# Patient Record
Sex: Female | Born: 1965 | Race: White | Hispanic: No | Marital: Single | Smoking: Never smoker
Health system: Southern US, Community
[De-identification: ages and names within clinical notes are randomized; demographics above are authoritative.]

## PROBLEM LIST (undated history)

## (undated) DIAGNOSIS — N750 Cyst of Bartholin's gland: Secondary | ICD-10-CM

## (undated) DIAGNOSIS — K219 Gastro-esophageal reflux disease without esophagitis: Secondary | ICD-10-CM

## (undated) DIAGNOSIS — E039 Hypothyroidism, unspecified: Secondary | ICD-10-CM

## (undated) DIAGNOSIS — M25569 Pain in unspecified knee: Secondary | ICD-10-CM

## (undated) DIAGNOSIS — F419 Anxiety disorder, unspecified: Secondary | ICD-10-CM

## (undated) DIAGNOSIS — M25559 Pain in unspecified hip: Secondary | ICD-10-CM

## (undated) DIAGNOSIS — R569 Unspecified convulsions: Secondary | ICD-10-CM

## (undated) HISTORY — DX: Cyst of Bartholin's gland: N75.0

## (undated) HISTORY — DX: Gastro-esophageal reflux disease without esophagitis: K21.9

## (undated) HISTORY — DX: Pain in unspecified hip: M25.559

## (undated) HISTORY — DX: Pain in unspecified knee: M25.569

## (undated) HISTORY — DX: Hypothyroidism, unspecified: E03.9

## (undated) HISTORY — PX: TOTAL ABDOMINAL HYSTERECTOMY: SHX209

## (undated) HISTORY — PX: ABDOMINAL HYSTERECTOMY: SHX81

## (undated) HISTORY — PX: KNEE ARTHROSCOPY: SUR90

---

## 1998-11-18 ENCOUNTER — Other Ambulatory Visit: Admission: RE | Admit: 1998-11-18 | Discharge: 1998-11-18 | Payer: Self-pay | Admitting: Obstetrics & Gynecology

## 2003-02-18 ENCOUNTER — Emergency Department (HOSPITAL_COMMUNITY): Admission: EM | Admit: 2003-02-18 | Discharge: 2003-02-18 | Payer: Self-pay | Admitting: Emergency Medicine

## 2003-02-18 ENCOUNTER — Encounter: Payer: Self-pay | Admitting: Emergency Medicine

## 2003-08-24 ENCOUNTER — Emergency Department (HOSPITAL_COMMUNITY): Admission: EM | Admit: 2003-08-24 | Discharge: 2003-08-24 | Payer: Self-pay | Admitting: Emergency Medicine

## 2004-08-10 ENCOUNTER — Encounter: Admission: RE | Admit: 2004-08-10 | Discharge: 2004-08-10 | Payer: Self-pay | Admitting: Sports Medicine

## 2004-09-02 ENCOUNTER — Encounter: Admission: RE | Admit: 2004-09-02 | Discharge: 2004-09-02 | Payer: Self-pay | Admitting: Sports Medicine

## 2004-10-28 ENCOUNTER — Encounter: Admission: RE | Admit: 2004-10-28 | Discharge: 2004-10-28 | Payer: Self-pay | Admitting: Sports Medicine

## 2005-04-06 ENCOUNTER — Ambulatory Visit (HOSPITAL_BASED_OUTPATIENT_CLINIC_OR_DEPARTMENT_OTHER): Admission: RE | Admit: 2005-04-06 | Discharge: 2005-04-06 | Payer: Self-pay | Admitting: Surgery

## 2005-04-06 ENCOUNTER — Encounter (INDEPENDENT_AMBULATORY_CARE_PROVIDER_SITE_OTHER): Payer: Self-pay | Admitting: Specialist

## 2005-04-06 ENCOUNTER — Ambulatory Visit (HOSPITAL_COMMUNITY): Admission: RE | Admit: 2005-04-06 | Discharge: 2005-04-06 | Payer: Self-pay | Admitting: Surgery

## 2005-04-10 ENCOUNTER — Emergency Department (HOSPITAL_COMMUNITY): Admission: EM | Admit: 2005-04-10 | Discharge: 2005-04-10 | Payer: Self-pay | Admitting: Emergency Medicine

## 2007-04-26 ENCOUNTER — Encounter: Admission: RE | Admit: 2007-04-26 | Discharge: 2007-04-26 | Payer: Self-pay | Admitting: *Deleted

## 2007-05-24 ENCOUNTER — Encounter: Admission: RE | Admit: 2007-05-24 | Discharge: 2007-05-24 | Payer: Self-pay | Admitting: *Deleted

## 2007-11-04 ENCOUNTER — Encounter: Admission: RE | Admit: 2007-11-04 | Discharge: 2007-11-04 | Payer: Self-pay | Admitting: Family Medicine

## 2008-01-01 ENCOUNTER — Inpatient Hospital Stay (HOSPITAL_COMMUNITY): Admission: EM | Admit: 2008-01-01 | Discharge: 2008-01-03 | Payer: Self-pay | Admitting: Emergency Medicine

## 2008-06-18 ENCOUNTER — Ambulatory Visit (HOSPITAL_COMMUNITY): Admission: RE | Admit: 2008-06-18 | Discharge: 2008-06-18 | Payer: Self-pay | Admitting: Obstetrics and Gynecology

## 2008-11-25 ENCOUNTER — Encounter: Admission: RE | Admit: 2008-11-25 | Discharge: 2008-11-25 | Payer: Self-pay | Admitting: Gastroenterology

## 2009-07-21 ENCOUNTER — Ambulatory Visit (HOSPITAL_COMMUNITY): Admission: RE | Admit: 2009-07-21 | Discharge: 2009-07-21 | Payer: Self-pay | Admitting: Obstetrics and Gynecology

## 2010-08-21 ENCOUNTER — Encounter: Payer: Self-pay | Admitting: Neurosurgery
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
## 2010-12-13 NOTE — Discharge Summary (Signed)
NAMEMarland Kitchen  Karen Gross, Karen Gross NO.:  [id number]   MEDICAL RECORD NO.:  [id number]          PATIENT TYPE:  INP   LOCATION:  3308                         FACILITY:  [facility]   PHYSICIAN:  Ramiro Harvest, MD    DATE OF BIRTH:  12-Jan-1966   DATE OF ADMISSION:  01/01/2008  DATE OF DISCHARGE:                               DISCHARGE SUMMARY   The patient's primary care physician is Claude Manges, nurse  practitioner, at [facility] at [facility].   DISCHARGE DIAGNOSES:  1. Seizures versus pseudoseizures.  2. Headaches.  3. Hypothyroidism.  4. Hypokalemia.  5. Gastroesophageal reflux disease.  6. History of constipation.  7. Status post hysterectomy.  8. History of Mohs.   DISPOSITION AND FOLLOW-UP:  The patient will be discharged home to  follow up with PCP in 1 week.  On follow-up, a basic metabolic profile  needs to be checked.  A Dilantin level will also need to be checked as  well and the patient is also to call to schedule a followup appointment  with Dr. Threasa Beards of [facility] in the next 3-4 weeks and the  patient's PCP and Dr. Threasa Beards.  The patient is to follow up with them on  EEG results.   DISCHARGE MEDICATIONS:  1. Dilantin 300 mg p.o. nightly.  2. Effexor 150 mg p.o. daily.  3. Synthroid 88 mcg Monday through Thursday and 100 mcg Friday through      Sunday.  4. Fiber pills as previously taken.  5. Lysine as previously taken.  6. Prilosec 20 mg p.o. daily.  7. Mucinex as previously taken.   CONSULTANTS:  Consultations done.  1. A Neurology consult was done.  The patient was seen in consultation      by Dr. Threasa Beards on January 01, 2008.   PROCEDURES PERFORMED:  1. A chest x-ray was performed on January 01, 2008 that showed no active      disease.  2. A head CT without contrast was performed on January 01, 2008 that      showed a 6 x 9 mm hypodensity in right deep white matter, unclear      if this is an acute finding.  MR may be helpful in further  evaluation.  3. An MRI was obtained on January 01, 2008, which showed no abnormality to      correlate to the hypodense focus identified on the earlier CT.      Mild left hippocampal volume loss suspected with increased T2      signal which may reflect acute postictal changes, otherwise      unremarkable MRI appearance of the brain for age.  4. An EEG was performed on January 02, 2008.   BRIEF ADMISSION HISTORY AND PHYSICAL:  Karen Gross is a 45 year old  right-handed white female with family history of seizures,  hypothyroidism, headache, who presented to the ED with recurrent  seizures.  The patient was postictal and drowsy secondary to Ativan and  as such most of the history was obtained from the patient's partner.  Per the patient's partner, the patient had been having episodes of  cough  and was seen by the PCP 2 days prior to admission, was placed on  cephalexin as well as Tessalon Perles and Mucinex for possible sinus  infection.  On the morning of admission at approximately 4 a.m., the  patient awoke with complaints of frontal headache and then fell backward  on the bed with jerking movements in all limbs.  Per the patient's  partner, the patient had some grand mal seizures lasting approximately a  minute with no tongue biting.  The patient had about 2-3 minutes of  unresponsiveness in between the episodes and had further episodes of  witnessed seizures.  The patient had a total of 6 episodes of witnessed  seizures per partner.  The patient with no recollection of events  afterwards.  EMS was called.  The patient was brought to the ED.  In the  ED, no further episodes of seizures.  UDS was obtained which was  negative.  Alcohol level was negative.  Head CT with a 6 x 9 mm  hypodensity as stated above.  MRI was also obtained with results as  stated above.  BMET was unremarkable.  Alcohol level was less than 5.  We were called to admit the patient for further evaluation and   recommendation.  The patient denied any fevers, no chills, no nausea, no  vomiting, no chest pain, no shortness of breath, no palpitations, no  melena, no hematochezia, no abdominal pain, no other focal neurological  symptoms.   PHYSICAL EXAMINATION:  On admission, temperature 97.6, blood pressure  133/63, pulse of 79, respiratory rate 18, and sating 96% on room air.  GENERAL:  The patient was sleeping but arousable to verbal and noxious  stimuli and following commands.  HEENT:  Normocephalic, atraumatic.  Pupils equal, round, and reactive to  light.  Extraocular movements intact.  Oropharynx was clear and moist.  No lesions.  No exudate.  NECK:  Supple.  No lymphadenopathy.  RESPIRATORY:  Lungs were clear to auscultation bilaterally.  No wheezes,  no rhonchi, and no crackles.  CARDIOVASCULAR:  Regular rate and rhythm.  No murmurs, rubs, or gallops.  ABDOMEN:  Soft, nontender, nondistended, positive bowel sounds.  EXTREMITIES:  No clubbing, cyanosis, or edema.  NEUROLOGICAL:  The patient was alert and oriented x3.  Cranial nerves II-  XII were grossly intact.  Sensation was intact.  Unable to elicit  reflexes diffusely.  Negative Babinski.  Visual fields were intact.  Slight dysmetria on the left.  Gait was not tested secondary to safety.   ADMISSION LABS:  Sodium 142, potassium 3.4, chloride 109, bicarb 24, BUN  8, creatinine 0.81, glucose of 88, calcium of 8.8.  Alcohol level less  than 5.  UDS negative.  CBC, white count 6.8, hemoglobin 13.5,  hematocrit 38.8, platelets 250, ANC 4.4.  A urine HCG was negative.  CT  of the head as stated above.  MRI as stated above.   HOSPITAL COURSE:  1. Seizures versus pseudoseizures.  The patient was admitted initially      to the step-down unit.  Workup on admission had so far been      negative.  Magnesium level was checked, electrolytes were checked,      hepatic panel was checked, chest x-ray was also obtained, a      urinalysis was  obtained, RPR was also obtained, total CK was      obtained, which were all negative.  The patient was placed on  Ativan as needed, later on that evening on admission the patient      did have 3 more episodes of seizures per nursing report which      within the 30-minute period which resolved rather quickly with very      little postictal state.  Neurology was consulted that came and saw      the patient evaluation.  The patient was loaded with fosphenytoin      and placed on Dilantin 300 mg daily.  An EEG was obtained with      results pending by day of discharge.  The patient did not have any      more episodes of seizures over the next 36 hours and the patient      will be discharged home in stable and improved condition to follow      up with Neurology and follow up with PCP.  The patient will need a      Dilantin level checked in the weeks post discharge.  The patient      will be discharged in stable and improved condition.  2. Hypothyroidism.  A TSH was checked which was within normal limits.      The patient was continued on her home dose of Synthroid.  3. Hypokalemia.  The patient was found to be hypokalemic.  Magnesium      level was checked which was stable and the patient's potassium      levels were repleted.  The rest of the patient's chronic medical      issues were stable throughout the hospitalization and the patient      will be discharged in stable and improved condition.   On the day of discharge, vital signs, temperature 97.8, pulse of 94,  blood pressure 123/63, respiratory rate 18, and sating 97% on room air.   DISCHARGE LABS:  Sodium 140, potassium 3.6, chloride 107, bicarb 28, BUN  4, creatinine 0.82, glucose of 94, and a calcium of 8.7.  EEG was  pending on the day of discharge and the patient will need to follow up  on this.   It was a pleasure taking care of Karen Gross.      Ramiro Harvest, MD  Electronically Signed     DT/MEDQ  D:   01/03/2008  T:  01/03/2008  Job:  [id number]

## 2010-12-13 NOTE — H&P (Signed)
NAMEJONNIE, Gross NO.:  [id number]   MEDICAL RECORD NO.:  [id number]          PATIENT TYPE:  INP   LOCATION:  2913                         FACILITY:  [facility]   PHYSICIAN:  Ramiro Harvest, MD    DATE OF BIRTH:  08/31/1965   DATE OF ADMISSION:  01/01/2008  DATE OF DISCHARGE:                              HISTORY & PHYSICAL   PRIMARY CARE PHYSICIAN:  Dr. Claude Manges   HISTORY OF PRESENT ILLNESS:  Karen Gross is a 45 year old right-  handed white female with history of seizures, family history of  seizures, hypothyroidism, headaches who presents to the ED with  recurrent seizures.  The patient was postictal and drowsy secondary to  Ativan and as such most of the history was obtained from the patient's  partner.  Per the patient's partner, the patient had been having  episodes of cough and was seen by PCP 2 days prior to admission and was  placed on cephalexin as well as Tessalon Perles and Mucinex for possible  sinus infection.  On the morning of admission at approximately 4:00  a.m., the patient woke with complaints of a frontal headache and then  slammed backward on the bed with a jerking movements in all limbs and  per patient had some grand mal seizure lasting approximately a minute,  no tongue biting.  The patient had about 2-3 minutes of unresponsiveness  in between episodes and had further episodes of witnessed seizures.  The  patient had a total of 6 episodes of witnessed seizures per partner.  The patient with no recollection of events afterwards.  EMS was called,  the patient was brought to the ED, in the ED no further episodes of  seizures.  UDS obtained was negative, alcohol level negative, head CT  with a 6 x 9 mm hypodensity.  MRI was obtained that showed a mild left  hippocampal volume loss suspected with an increased T2 signal.  BMET was  unremarkable, alcohol level was less than 5.  We were called to admit  the patient for further evaluation  and recommendations.  The patient  denied any fevers, no chills, no nausea no vomiting, no chest pain, no  shortness of breath, no palpitations, no melena or hematochezia, no  abdominal pain, no other focal neurological deficits.   ALLERGIES:  No known drug allergies.   PAST MEDICAL HISTORY:  1. History of seizures, last episode 11 years ago approximately  2. Hypothyroidism.  3. History of headaches.  4. Status post hysterectomy secondary to fibroids.  5. Fibroids.  6. Moles.  7. Constipation.  8. Gastroesophageal reflux disease.   MEDICATIONS:  Effexor XR 150 mg p.o. daily, Synthroid 88 mcg Mondays  through Thursdays and 100 mcg Fridays through Sunday; fiber pills 2  tablets daily; Lycine daily; cephalexin, dose unknown; Tessalon Perles,  dose unknown; Mucinex, dose unknown; Prilosec 20 mg daily.  Cephalexin,  Tessalon Perles, and Mucinex all started on December 30, 2007.   SOCIAL HISTORY:  The patient lives in [place] with her partner and  their 2 children.  No tobacco use, occasional alcohol use, no IV drug  use.   FAMILY HISTORY:  Mother alive, age 77 with valvular heart disease and  hypertension.  Father alive, age 6 with a history of hypertension and  seizures.  The patient has four sisters with histories of moles,  chemical imbalance and migraine headaches.  The patient with a great  nephew with a history of seizures.   REVIEW OF SYSTEMS:  Positive for cough and constipation, otherwise per  HPI.   PHYSICAL EXAMINATION:  VITAL SIGNS:  Temperature 97.6, blood pressure  133/63, pulse of 79, respiratory rate 18, sating 96% on room air.  GENERAL: The patient sleeping but arousable to verbal and noxious  stimuli and following commands.  HEENT:  Normocephalic, atraumatic.  Pupils equal, round and reactive to  light.  Extraocular movements intact.  Oropharynx is clear, moist, no  lesions, no exudates.  Neck is supple.  No lymphadenopathy.  RESPIRATORY:  Lungs are clear to  auscultation bilaterally.  No wheezes,  no rhonchi.  No crackles.  CARDIOVASCULAR: Regular rate and rhythm.  No murmurs, rubs or gallops.  ABDOMEN:  Soft, nontender, nondistended.  Positive bowel sounds.  EXTREMITIES:  No clubbing, cyanosis or edema.  NEUROLOGICAL:  The patient is alert and oriented x3.  Cranial nerves II-  XII are grossly intact.  Sensation is intact, unable to elicit reflexes  diffusely, negative Babinski, visual fields intact, some slight  dysmetria on the left, gait not tested secondary to safety.   LABORATORY DATA:  Sodium 142, potassium 3.4, chloride 109, bicarb 24,  BUN 8, creatinine 0.81, glucose of 88, calcium of 8.8, alcohol level  less than 5.  UDS negative.  CBC white count 6.8, hemoglobin 13.5,  hematocrit 38.8, platelets of 250, ANC of 4.4.  A urine HCG was  negative.  CT of the head with a 6 x 9 mm hypodensity in the right deep  white matter.  Unclear of acute findings, MRI for further evaluation.  MRI no abnormality to correlate with hypodense focus identified on CT,  mild left hippocampal volume loss suspected with increased T2 signal  which may reflect acute postictal changes, otherwise unremarkable.  MRI  for brain age.   ASSESSMENT AND PLAN:  Karen Gross is a 45 year old female with  history of seizures, last 11 years ago, hypothyroidism, headaches who  presents to the ED with recurrent episodes of seizures:  1. Recurrent seizures.  Admit the patient to the stepdown unit.      Differential includes cerebrovascular disease versus brain tumor.      It is unlikely whether negative MI versus alcohol withdrawal,      unlikely with no history of alcohol abuse on alcohol level less      than 5 versus degenerative disorder versus electrolyte metabolic      abnormality.  UDS was negative as well.  We checked her hepatic      panel, checked her mag level, checked chest x-ray, checked UA      cultures and sensitivities, checked RPR, checked a total CK,  check      a EEG, Ativan as needed.  We will consult with Neurology for      further evaluation and recommendations.  Seizure precautions      monitor.  2. Hypothyroidism.  Check a TSH, continue home dose Synthroid.  3. Headache effects at home dose.  4. Protonix 40 mg daily.  5. Hypokalemia.  Replete  6. Prophylaxis.  Protonix for gastrointestinal prophylaxis, sequential      compression devices for deep venous  thrombosis prophylaxis.   It was a pleasure taking care of Ms. Karen Gross.      Ramiro Harvest, MD  Electronically Signed     DT/MEDQ  D:  01/01/2008  T:  01/02/2008  Job:  [id number]

## 2010-12-13 NOTE — Procedures (Signed)
EEG NUMBER:   REFERRING PHYSICIAN:  Ramiro Harvest, M.D.   This is a portable study on a lethargic patient currently in the  coronary care unit at [facility]  The patient was exposed to  photic stimulation.  According to the technician note, hyperventilation  could not be performed in a portable study.   CURRENT MEDICATIONS:  Ativan, potassium, Protonix, Effexor, Synthroid,  Lovenox, Dilantin, phenytoin, fosphenytoin are both listed, Zofran, and  Tylenol.   This patient has a history of seizures that ceased about 12 years ago  and was now admitted due to resurgence of seizure activity.  Clinical  description of the seizures was not available.   The study has excessive motion and muscle tone artifact and is therefore  very hard to interpret.  The patient's status is somewhat awake.  Also,  clinically not described as alert with a posterior dominant background  rhythm of 8 Hz and then became progressively drowsy.  Theta range  frequencies are then seen indicating drowsiness and finally the patient  is asleep seen as frontal and central sleep spindle activity, which is  not symmetric.  She started snoring, which caused additional artifact  towards the last third of the recording and photic stimulation was  performed at the very end of this recording showing no epileptiform  discharges in response to this maneuver.  The EKG shows a sinus tachycardia with occasional R-R interval changes  that may be PVCs or PACs.   CONCLUSION:  This is a technically challenging study.  The patient  showed symmetric sleep architecture and symmetric awake, alert  architecture, however, during drowsiness, there were multiple epochs  during which the right-sided temporal EEG appeared slower than on the  left.  I did not see evidence of epileptiform discharges; however, I cannot  consider this a normal EEG as the patient seems to be unable to comply  with the required maneuvers.  I believe this  patient was unable to relax  and to follow the activation procedure instructions.  Also, no  epileptiform discharge is noted.  The motion artifact makes the  interpretation of the study rather difficult.  There is no obvious  abnormality that would be linked to an epileptiform discharge seen.      Melvyn Novas, M.D.  Electronically Signed     [id number]  D:  01/03/2008 12:58:19  T:  01/04/2008 00:20:15  Job #:  [id number]

## 2010-12-16 NOTE — Op Note (Signed)
Karen Gross, Karen Gross             ACCOUNT NO.:  [id number]   MEDICAL RECORD NO.:  [id number]          PATIENT TYPE:  AMB   LOCATION:  DSC                          FACILITY:  [facility]   PHYSICIAN:  Thomas A. Cornett, M.D.DATE OF BIRTH:  [birth year]   DATE OF PROCEDURE:  04/06/2005  DATE OF DISCHARGE:                                 OPERATIVE REPORT   PREOPERATIVE DIAGNOSIS:  Persistent left groin granulation tissue measuring  2 x 2 cm.   POSTOPERATIVE DIAGNOSIS:  Persistent left groin granulation tissue measuring  2 x 2 cm.   PROCEDURE:  Excision of 2 x 2-cm region of left groin granulation tissue.   SURGEON:  Maisie Fus A. Cornett, M.D.   ANESTHESIA:  LMA with 10 cc of 0.5% Sensorcaine with epinephrine local.   ESTIMATED BLOOD LOSS:  5 cc.   SPECIMENS:  2 x 2-cm skin with subcutaneous tissue, granulation region, sent  to pathology for further evaluation.   INDICATIONS FOR PROCEDURE:  The patient is a 45 year old female who has an  area in her left groin near her mons pubis that has been tender.  She has  been worked up and treated medically, with no resolution of her pain.  The  area measures 2 x 2, is tender to touch, and has the appearance of old  granulation tissue, probably secondary to previous folliculitis.  After  trying antibiotics on her, she did not get better and I recommended excision  of it.  She understood and agreed to proceed.   DESCRIPTION OF PROCEDURE:  The patient was brought to the operating suite  and placed supine.  Anesthesia was initiated initially with IV Versed by  anesthesia, but then LMA was inserted due patient discomfort.  The left  groin was then prepped and draped in sterile fashion.  In the left crease  between the mons pubis and left thigh was an area of skin within the hair  follicles that measured 2 x 2 cm and was slightly erythematous.  This was  prepped and draped in sterile fashion, and 1% lidocaine was used to  anesthetize the area.   The  area that was excised measured a total area of 2 x 2 cm.  I palpated the  subcutaneous tissues and could feel nothing else at this point in time.  Hemostasis was achieved with cautery, and 4-0 Monocryl was used to close the  skin incisions.  Steri-Strips and dry dressings were applied.  All final  counts were counted and found to be correct at this portion of the case.   The patient was awakened and taken to recovery in satisfactory condition.      Thomas A. Cornett, M.D.  Electronically Signed     TAC/MEDQ  D:  04/06/2005  T:  04/06/2005  Job:  [id number]   cc:   [facility]

## 2011-04-17 ENCOUNTER — Ambulatory Visit (HOSPITAL_COMMUNITY)
Admission: RE | Admit: 2011-04-17 | Discharge: 2011-04-17 | Disposition: A | Discharge status: Home / Self Care | Payer: PRIVATE HEALTH INSURANCE | Attending: Psychiatry | Admitting: Psychiatry

## 2011-04-17 DIAGNOSIS — F331 Major depressive disorder, recurrent, moderate: Secondary | ICD-10-CM | POA: Insufficient documentation

## 2011-04-27 LAB — DIFFERENTIAL
Basophils Absolute: 0
Eosinophils Absolute: 0.1
Eosinophils Relative: 1
Lymphocytes Relative: 30
Lymphs Abs: 1.7
Lymphs Abs: 2
Monocytes Absolute: 0.5
Neutro Abs: 3.9
Neutro Abs: 4.4
Neutrophils Relative %: 65

## 2011-04-27 LAB — BASIC METABOLIC PANEL
BUN: 8
CO2: 28
Calcium: 8.6
Calcium: 8.8
Chloride: 107
Chloride: 109
Creatinine, Ser: 0.81
Creatinine, Ser: 0.82
GFR calc Af Amer: >60
GFR calc Af Amer: >60
GFR calc non Af Amer: >60
GFR calc non Af Amer: >60
Potassium: 3.4 — ABNORMAL LOW
Potassium: 3.6
Sodium: 139

## 2011-04-27 LAB — RAPID URINE DRUG SCREEN, HOSP PERFORMED
Amphetamines: NOT DETECTED
Benzodiazepines: NOT DETECTED
Tetrahydrocannabinol: NOT DETECTED

## 2011-04-27 LAB — CBC
HCT: 40.1
Hemoglobin: 14.3
MCV: 90.2
Platelets: 250
Platelets: 256
WBC: 6.5
WBC: 6.8

## 2011-04-27 LAB — HEPATIC FUNCTION PANEL
ALT: 14
Albumin: 3.8
Indirect Bilirubin: 0.7
Total Protein: 6.2

## 2011-04-27 LAB — TSH: TSH: 5.437

## 2011-04-27 LAB — ETHANOL: Alcohol, Ethyl (B): <5

## 2011-04-27 LAB — PREGNANCY, URINE: Preg Test, Ur: NEGATIVE

## 2011-04-27 LAB — APTT: aPTT: 29

## 2011-04-27 LAB — MAGNESIUM: Magnesium: 2.2

## 2011-04-27 LAB — CK: Total CK: 74

## 2011-04-27 LAB — RPR: RPR Ser Ql: NONREACTIVE

## 2012-02-02 ENCOUNTER — Other Ambulatory Visit (HOSPITAL_COMMUNITY): Payer: Self-pay | Admitting: Obstetrics and Gynecology

## 2012-02-02 DIAGNOSIS — Z1231 Encounter for screening mammogram for malignant neoplasm of breast: Secondary | ICD-10-CM

## 2012-02-23 ENCOUNTER — Ambulatory Visit (HOSPITAL_COMMUNITY)
Admission: RE | Admit: 2012-02-23 | Discharge: 2012-02-23 | Disposition: A | Discharge status: Home / Self Care | Payer: 59 | Source: Ambulatory Visit | Attending: Obstetrics and Gynecology | Admitting: Obstetrics and Gynecology

## 2012-02-23 DIAGNOSIS — Z1231 Encounter for screening mammogram for malignant neoplasm of breast: Secondary | ICD-10-CM | POA: Insufficient documentation

## 2012-08-13 ENCOUNTER — Other Ambulatory Visit: Payer: Self-pay | Admitting: Family Medicine

## 2012-08-13 ENCOUNTER — Ambulatory Visit
Admission: RE | Admit: 2012-08-13 | Discharge: 2012-08-13 | Disposition: A | Discharge status: Home / Self Care | Payer: BC Managed Care – PPO | Source: Ambulatory Visit | Attending: Family Medicine | Admitting: Family Medicine

## 2012-08-13 DIAGNOSIS — R05 Cough: Secondary | ICD-10-CM

## 2013-12-16 ENCOUNTER — Ambulatory Visit (INDEPENDENT_AMBULATORY_CARE_PROVIDER_SITE_OTHER): Payer: BC Managed Care – PPO | Admitting: Family Medicine

## 2013-12-16 VITALS — BP 108/64 | HR 75 | Temp 98.0°F | Resp 16 | Ht 66.0 in | Wt 202.0 lb

## 2013-12-16 DIAGNOSIS — R059 Cough, unspecified: Secondary | ICD-10-CM

## 2013-12-16 DIAGNOSIS — J209 Acute bronchitis, unspecified: Secondary | ICD-10-CM

## 2013-12-16 DIAGNOSIS — J22 Unspecified acute lower respiratory infection: Secondary | ICD-10-CM

## 2013-12-16 DIAGNOSIS — J988 Other specified respiratory disorders: Secondary | ICD-10-CM

## 2013-12-16 DIAGNOSIS — R05 Cough: Secondary | ICD-10-CM

## 2013-12-16 DIAGNOSIS — R0982 Postnasal drip: Secondary | ICD-10-CM

## 2013-12-16 MED ORDER — AZITHROMYCIN 250 MG PO TABS: ORAL_TABLET | ORAL | Status: DC

## 2013-12-16 MED ORDER — BENZONATATE 100 MG PO CAPS: 200.0000 mg | ORAL_CAPSULE | Freq: Two times a day (BID) | ORAL | Status: DC | PRN

## 2013-12-16 MED ORDER — HYDROCOD POLST-CHLORPHEN POLST 10-8 MG/5ML PO LQCR: 5.0000 mL | Freq: Every evening | ORAL | Status: DC | PRN

## 2013-12-16 MED ORDER — IPRATROPIUM BROMIDE 0.03 % NA SOLN: 2.0000 | Freq: Two times a day (BID) | NASAL | Status: DC

## 2013-12-16 NOTE — Progress Notes (Signed)
Chief Complaint:  Chief Complaint  Patient presents with  . Cough    x's 10 days  . Nasal Congestion    HPI: Karen Gross is a 48 y.o. female who is here for  10 day history of cough , green sputum, runny nose. No fevers or chills. She feels overall better but can't get  rid of it. Nyquil and dayquil without releif. And also mucinex DM. She deneis fevers or chills, facial pain or ear pain. She has had z packs in the past and that has helped. No allergies or asthma. Nonsmoker. She works at [employer]. 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   No past medical history on file. No past surgical history on file. History   Social History  . Marital Status: Single    Spouse Name: N/A    Number of Children: N/A  . Years of Education: N/A   Social History Main Topics  . Smoking status: Never Smoker   . Smokeless tobacco: None  . Alcohol Use: None  . Drug Use: None  . Sexual Activity: None   Other Topics Concern  . None   Social History Narrative  . None   No family history on file. No Known Allergies Prior to Admission medications   Medication Sig Start Date End Date Taking? Authorizing Provider  ARIPiprazole (ABILIFY) 10 MG tablet Take 10 mg by mouth daily.   Yes Historical Provider, MD  meloxicam (MOBIC) 15 MG tablet Take 15 mg by mouth daily.   Yes Historical Provider, MD  topiramate (TOPAMAX) 200 MG tablet Take 200 mg by mouth 2 (two) times daily.   Yes Historical Provider, MD     ROS: The patient denies fevers, chills, night sweats, unintentional weight loss, chest pain, palpitations, wheezing, dyspnea on exertion, nausea, vomiting, abdominal pain, dysuria, hematuria, melena, numbness, weakness, or tingling.   All other systems have been reviewed and were otherwise negative with the exception of those mentioned in the HPI and as above.    PHYSICAL EXAM: Filed Vitals:   12/16/13 1742  BP: 108/64  Pulse: 75  Temp: 98 F (36.7  C)  Resp: 16   Filed Vitals:   12/16/13 1742  Height: 5\' 6"  (1.676 m)  Weight: 202 lb (91.627 kg)   Body mass index is 32.62 kg/(m^2).  General: Alert, no acute distress HEENT:  Normocephalic, atraumatic, oropharynx patent. EOMI, PERRLA  Non to min sinus tenderness, TM nl, boggy nares, no exudates.  Cardiovascular:  Regular rate and rhythm, no rubs murmurs or gallops.  No Carotid bruits, radial pulse intact. No pedal edema.  Respiratory: Clear to auscultation bilaterally.  No wheezes, rales, or rhonchi.  No cyanosis, no use of accessory musculature GI: No organomegaly, abdomen is soft and non-tender, positive bowel sounds.  No masses. Skin: No rashes. Neurologic: Facial musculature symmetric. Psychiatric: Patient is appropriate throughout our interaction. Lymphatic: No cervical lymphadenopathy Musculoskeletal: Gait intact.   LABS:    EKG/XRAY:   Primary [facility].   ASSESSMENT/PLAN: Encounter Diagnoses  Name Primary?  . Cough Yes  . Lower respiratory infection   . Acute bronchitis   . PND (post-nasal drip)    Rx atrovent NS, Tussionex, tessalon perles, and also z pack if sxs treatment do not improve F/u prn Gross sideeffects, risk and benefits, and alternatives of medications d/w patient. Patient is aware that all medications have potential sideeffects and we are unable to  predict every sideeffect or drug-drug interaction that may occur.  Lenell Antuhao P Natashia Roseman, DO 12/16/2013 6:08 PM

## 2013-12-16 NOTE — Patient Instructions (Signed)
Bronchitis  Bronchitis is inflammation of the airways that extend from the windpipe into the lungs (bronchi). The inflammation often causes mucus to develop, which leads to a cough. If the inflammation becomes severe, it may cause shortness of breath.  CAUSES   Bronchitis may be caused by:    Viral infections.    Bacteria.    Cigarette smoke.    Allergens, pollutants, and other irritants.   SIGNS AND SYMPTOMS   The most common symptom of bronchitis is a frequent cough that produces mucus. Other symptoms include:   Fever.    Body aches.    Chest congestion.    Chills.    Shortness of breath.    Sore throat.   DIAGNOSIS   Bronchitis is usually diagnosed through a medical history and physical exam. Tests, such as chest X-rays, are sometimes done to rule out other conditions.   TREATMENT   You may need to avoid contact with whatever caused the problem (smoking, for example). Medicines are sometimes needed. These may include:   Antibiotics. These may be prescribed if the condition is caused by bacteria.   Cough suppressants. These may be prescribed for relief of cough symptoms.    Inhaled medicines. These may be prescribed to help open your airways and make it easier for you to breathe.    Steroid medicines. These may be prescribed for those with recurrent (chronic) bronchitis.  HOME CARE INSTRUCTIONS   Get plenty of rest.    Drink enough fluids to keep your urine clear or pale yellow (unless you have a medical condition that requires fluid restriction). Increasing fluids may help thin your secretions and will prevent dehydration.    Only take over-the-counter or prescription medicines as directed by your health care provider.   Only take antibiotics as directed. Make sure you finish them even if you start to feel better.   Avoid secondhand smoke, irritating chemicals, and strong fumes. These will make bronchitis worse. If you are a smoker, quit smoking. Consider using nicotine gum or  skin patches to help control withdrawal symptoms. Quitting smoking will help your lungs heal faster.    Put a cool-mist humidifier in your bedroom at night to moisten the air. This may help loosen mucus. Change the water in the humidifier daily. You can also run the hot water in your shower and sit in the bathroom with the door closed for 5 10 minutes.    Follow up with your health care provider as directed.    Wash your hands frequently to avoid catching bronchitis again or spreading an infection to others.   SEEK MEDICAL CARE IF:  Your symptoms do not improve after 1 week of treatment.   SEEK IMMEDIATE MEDICAL CARE IF:   Your fever increases.   You have chills.    You have chest pain.    You have worsening shortness of breath.    You have bloody sputum.   You faint.   You have lightheadedness.   You have a severe headache.    You vomit repeatedly.  MAKE SURE YOU:    Understand these instructions.   Will watch your condition.   Will get help right away if you are not doing well or get worse.  Document Released: 07/17/2005 Document Revised: 05/07/2013 Document Reviewed: 03/11/2013  Exit[employer] Patient Information 2014 [employer].

## 2014-04-15 ENCOUNTER — Other Ambulatory Visit (HOSPITAL_COMMUNITY): Payer: Self-pay | Admitting: Obstetrics and Gynecology

## 2014-04-15 DIAGNOSIS — Z1231 Encounter for screening mammogram for malignant neoplasm of breast: Secondary | ICD-10-CM

## 2014-05-04 ENCOUNTER — Ambulatory Visit (HOSPITAL_COMMUNITY)
Admission: RE | Admit: 2014-05-04 | Discharge: 2014-05-04 | Disposition: A | Discharge status: Home / Self Care | Payer: BC Managed Care – PPO | Source: Ambulatory Visit | Attending: Obstetrics and Gynecology | Admitting: Obstetrics and Gynecology

## 2014-05-04 DIAGNOSIS — Z1231 Encounter for screening mammogram for malignant neoplasm of breast: Secondary | ICD-10-CM | POA: Insufficient documentation

## 2014-11-12 LAB — BASIC METABOLIC PANEL
BUN: 16 mg/dL (ref 4–21)
CREATININE: 0.9 mg/dL (ref <=1.1)
Glucose: 89 mg/dL
POTASSIUM: 4.3 mmol/L (ref 3.4–5.3)
SODIUM: 139 mmol/L (ref 137–147)

## 2014-11-12 LAB — COMPLETE METABOLIC PANEL WITH GFR
ALT: 11
AST: 15 U/L
Alkaline Phosphatase: 84 U/L
BUN: 14 mg/dL (ref 4–21)
CREATININE: 0.86
Glucose: 92
POTASSIUM: 4.1 mmol/L
Sodium: 139
TOTBILIFLUID: 0.5

## 2014-11-12 LAB — TSH: TSH: 8.65 u[IU]/mL — AB (ref <=5.90)

## 2014-11-12 LAB — HEMOGLOBIN A1C: Hemoglobin A1C: 5.2

## 2014-11-12 LAB — T4, FREE: Thyroxine (T4): 9.6

## 2014-11-12 LAB — HEPATIC FUNCTION PANEL
ALK PHOS: 77 U/L (ref 25–125)
ALT: 12 U/L (ref 7–35)
AST: 14 U/L (ref 13–35)
Bilirubin, Total: 0.5 mg/dL

## 2015-10-17 ENCOUNTER — Ambulatory Visit (INDEPENDENT_AMBULATORY_CARE_PROVIDER_SITE_OTHER): Payer: BC Managed Care – PPO | Admitting: Family Medicine

## 2015-10-17 VITALS — BP 105/74 | HR 96 | Temp 99.1°F | Resp 16 | Wt 211.0 lb

## 2015-10-17 DIAGNOSIS — R0982 Postnasal drip: Secondary | ICD-10-CM | POA: Diagnosis not present

## 2015-10-17 DIAGNOSIS — R05 Cough: Secondary | ICD-10-CM | POA: Diagnosis not present

## 2015-10-17 DIAGNOSIS — R6889 Other general symptoms and signs: Secondary | ICD-10-CM

## 2015-10-17 DIAGNOSIS — J111 Influenza due to unidentified influenza virus with other respiratory manifestations: Secondary | ICD-10-CM

## 2015-10-17 DIAGNOSIS — J029 Acute pharyngitis, unspecified: Secondary | ICD-10-CM

## 2015-10-17 DIAGNOSIS — R059 Cough, unspecified: Secondary | ICD-10-CM

## 2015-10-17 DIAGNOSIS — J988 Other specified respiratory disorders: Secondary | ICD-10-CM

## 2015-10-17 DIAGNOSIS — J22 Unspecified acute lower respiratory infection: Secondary | ICD-10-CM

## 2015-10-17 LAB — POCT CBC
Granulocyte percent: 59.7 %G (ref 37–80)
HCT, POC: 36.1 % — AB (ref 37.7–47.9)
HEMOGLOBIN: 12.4 g/dL (ref 12.2–16.2)
Lymph, poc: 1.7 (ref 0.6–3.4)
MCH: 27.9 pg (ref 27–31.2)
MCHC: 34.4 g/dL (ref 31.8–35.4)
MCV: 81.1 fL (ref 80–97)
MID (cbc): 0.4 (ref 0–0.9)
MPV: 7.7 fL (ref 0–99.8)
PLATELET COUNT, POC: 246 10*3/uL (ref 142–424)
POC Granulocyte: 3.2 (ref 2–6.9)
POC LYMPH PERCENT: 32.4 %L (ref 10–50)
POC MID %: 7.9 %M (ref 0–12)
RBC: 4.45 M/uL (ref 4.04–5.48)
RDW, POC: 16.4 %
WBC: 5.4 10*3/uL (ref 4.6–10.2)

## 2015-10-17 LAB — POCT INFLUENZA A/B
INFLUENZA B, POC: NEGATIVE
Influenza A, POC: NEGATIVE

## 2015-10-17 LAB — POCT RAPID STREP A (OFFICE): RAPID STREP A SCREEN: NEGATIVE

## 2015-10-17 MED ORDER — OSELTAMIVIR PHOSPHATE 75 MG PO CAPS: 75.0000 mg | ORAL_CAPSULE | Freq: Two times a day (BID) | ORAL | Status: DC

## 2015-10-17 MED ORDER — HYDROCOD POLST-CPM POLST ER 10-8 MG/5ML PO SUER: 5.0000 mL | Freq: Two times a day (BID) | ORAL | Status: DC | PRN

## 2015-10-17 NOTE — Progress Notes (Signed)
Subjective:    Patient ID: Karen Gross, female    DOB: 06/11/1966, 50 y.o.   MRN: [id number]  10/17/2015  Sore Throat and Fever   HPI This 50 y.o. female presents for evaluation of sore throat, fever.  Onset one week ago.  +fever Tmax 101.0 last night; +chills/sweats onset two days.  +HA for two days.  No body aches. Taking Tylenol.  NO rhinorrhea mild; +nasal congestion. +coughing a lot; no SOB; no wheezing; scant sputum yellow.  No v/d.  Sudafed PRN; Tylenol.  No tobacco.  No asthma.  NO DMII.  S/p flu vaccine.  [facility]   Review of Systems  Constitutional: Negative for fever, chills, diaphoresis and fatigue.  Eyes: Negative for visual disturbance.  Respiratory: Negative for cough and shortness of breath.   Cardiovascular: Negative for chest pain, palpitations and leg swelling.  Gastrointestinal: Negative for nausea, vomiting, abdominal pain, diarrhea and constipation.  Endocrine: Negative for cold intolerance, heat intolerance, polydipsia, polyphagia and polyuria.  Neurological: Negative for dizziness, tremors, seizures, syncope, facial asymmetry, speech difficulty, weakness, light-headedness, numbness and headaches.    Past Medical History  Diagnosis Date  . Hypothyroidism    Past Surgical History  Procedure Laterality Date  . Abdominal hysterectomy     No Known Allergies  Social History   Social History  . Marital Status: Single    Spouse Name: N/A  . Number of Children: N/A  . Years of Education: N/A   Occupational History  . Not on file.   Social History Main Topics  . Smoking status: Never Smoker   . Smokeless tobacco: Not on file  . Alcohol Use: Not on file  . Drug Use: Not on file  . Sexual Activity: Not on file   Other Topics Concern  . Not on file   Social History Narrative   Family History  Problem Relation Age of Onset  . Breast cancer Mother   . Breast cancer Paternal Grandmother        Objective:    BP 105/74  mmHg  Pulse 96  Temp(Src) 99.1 F (37.3 C) (Oral)  Resp 16  Wt 211 lb (95.709 kg)  SpO2 98% Physical Exam  Constitutional: She is oriented to person, place, and time. She appears well-developed and well-nourished. No distress.  HENT:  Head: Normocephalic and atraumatic.  Eyes: Conjunctivae are normal. Pupils are equal, round, and reactive to light.  Neck: Normal range of motion. Neck supple.  Cardiovascular: Normal rate, regular rhythm and normal heart sounds.  Exam reveals no gallop and no friction rub.   No murmur heard. Pulmonary/Chest: Effort normal and breath sounds normal. She has no wheezes. She has no rales.  Neurological: She is alert and oriented to person, place, and time.  Skin: She is not diaphoretic.  Psychiatric: She has a normal mood and affect. Her behavior is normal.  Nursing note and vitals reviewed.       Assessment & Plan:   1. Influenza   2. Sore throat   3. Flu-like symptoms   4. Cough   5. Lower respiratory infection   6. PND (post-nasal drip)     Orders Placed This Encounter  Procedures  . Culture, Group A Strep    Order Specific Question:  Source    Answer:  THROAT  . POCT CBC  . POCT Influenza A/B  . POCT rapid strep A   Meds ordered this encounter  Medications  . DISCONTD: Levothyroxine Sodium (SYNTHROID PO)  Sig: Take by mouth.  . DISCONTD: oseltamivir (TAMIFLU) 75 MG capsule    Sig: Take 1 capsule (75 mg total) by mouth 2 (two) times daily.    Dispense:  10 capsule    Refill:  0  . chlorpheniramine-HYDROcodone (TUSSIONEX PENNKINETIC ER) 10-8 MG/5ML SUER    Sig: Take 5 mLs by mouth every 12 (twelve) hours as needed for cough.    Dispense:  180 mL    Refill:  0    No Follow-up on file.    Allye Hoyos Paulita Fujita, M.D. [facility] [address] [place], [place]  [address] [phone] phone [phone] fax

## 2015-10-17 NOTE — Patient Instructions (Signed)

## 2015-10-19 LAB — CULTURE, GROUP A STREP: Organism ID, Bacteria: NORMAL

## 2015-10-23 ENCOUNTER — Ambulatory Visit (INDEPENDENT_AMBULATORY_CARE_PROVIDER_SITE_OTHER): Payer: BC Managed Care – PPO | Admitting: Family Medicine

## 2015-10-23 ENCOUNTER — Ambulatory Visit (INDEPENDENT_AMBULATORY_CARE_PROVIDER_SITE_OTHER): Payer: BC Managed Care – PPO

## 2015-10-23 VITALS — BP 114/62 | HR 80 | Temp 98.2°F | Resp 16 | Ht 66.0 in | Wt 209.0 lb

## 2015-10-23 DIAGNOSIS — R0600 Dyspnea, unspecified: Secondary | ICD-10-CM | POA: Diagnosis not present

## 2015-10-23 DIAGNOSIS — E039 Hypothyroidism, unspecified: Secondary | ICD-10-CM

## 2015-10-23 DIAGNOSIS — R05 Cough: Secondary | ICD-10-CM

## 2015-10-23 DIAGNOSIS — R51 Headache: Secondary | ICD-10-CM

## 2015-10-23 DIAGNOSIS — R059 Cough, unspecified: Secondary | ICD-10-CM

## 2015-10-23 DIAGNOSIS — J209 Acute bronchitis, unspecified: Secondary | ICD-10-CM

## 2015-10-23 DIAGNOSIS — G43909 Migraine, unspecified, not intractable, without status migrainosus: Secondary | ICD-10-CM | POA: Insufficient documentation

## 2015-10-23 DIAGNOSIS — R519 Headache, unspecified: Secondary | ICD-10-CM

## 2015-10-23 DIAGNOSIS — G43809 Other migraine, not intractable, without status migrainosus: Secondary | ICD-10-CM | POA: Diagnosis not present

## 2015-10-23 LAB — POCT CBC
Granulocyte percent: 75.2 %G (ref 37–80)
HCT, POC: 33.8 % — AB (ref 37.7–47.9)
Hemoglobin: 11.6 g/dL — AB (ref 12.2–16.2)
Lymph, poc: 1.5 (ref 0.6–3.4)
MCH, POC: 27.5 pg (ref 27–31.2)
MCHC: 34.3 g/dL (ref 31.8–35.4)
MCV: 80.2 fL (ref 80–97)
MID (cbc): 0.5 (ref 0–0.9)
MPV: 7.5 fL (ref 0–99.8)
POC Granulocyte: 6.2 (ref 2–6.9)
POC LYMPH PERCENT: 18.1 %L (ref 10–50)
POC MID %: 6.7 %M (ref 0–12)
Platelet Count, POC: 249 10*3/uL (ref 142–424)
RBC: 4.21 M/uL (ref 4.04–5.48)
RDW, POC: 15.5 %
WBC: 8.2 10*3/uL (ref 4.6–10.2)

## 2015-10-23 MED ORDER — LEVOFLOXACIN 500 MG PO TABS: 500.0000 mg | ORAL_TABLET | Freq: Every day | ORAL | Status: DC

## 2015-10-23 NOTE — Progress Notes (Addendum)
By signing my name below, I, Soijett Blue, attest that this documentation has been prepared under the direction and in the presence of Elvina SidleKurt Lauenstein, MD. Electronically Signed: Soijett Blue, ED Scribe. 10/23/2015. 8:39 AM.   Patient ID: Karen Gross MRN: [id number], DOB: 04/09/1966, 50 y.o. Date of Encounter: 10/23/2015, 8:15 AM  Primary Physician: No primary care provider on file.  Chief Complaint: Cough  HPI: 50 y.o. year old female with history below presents with productive cough x 10 days. Pt note sthat her symptoms began with HA and sore throat. [facility] for her symptoms and dx with flu. Pt was Rx tamiflu and tussionex with mild relief of her symptoms. Pt has associated symptoms of nasal congestion, HA, chills yesterday, subjective fever, and SOB. Pt denies any other. Pt denies being a cigarette smoker. Pt denies PMHx of asthma. Pt notes that she is typically a healthy person. Pt denies PMHx of HTN or DM. Pt is unsure as to why she is taking Abilify.   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 [facility].    Past Medical History  Diagnosis Date   Hypothyroidism      Home Meds: Prior to Admission medications   Medication Sig Start Date End Date Taking? Authorizing Provider  ARIPiprazole (ABILIFY) 10 MG tablet Take 10 mg by mouth daily.   Yes Historical Provider, MD  chlorpheniramine-HYDROcodone (TUSSIONEX PENNKINETIC ER) 10-8 MG/5ML SUER Take 5 mLs by mouth every 12 (twelve) hours as needed for cough. 10/17/15  Yes Ethelda ChickKristi M Smith, MD  levothyroxine (SYNTHROID, LEVOTHROID) 112 MCG tablet Take 112 mcg by mouth daily before breakfast.   Yes Historical Provider, MD  topiramate (TOPAMAX) 100 MG tablet Take 100 mg by mouth 2 (two) times daily.   Yes Historical Provider, MD    Allergies: No Known Allergies  Social History   Social History   Marital Status: Single    Spouse Name: N/A   Number of Children:  N/A   Years of Education: N/A   Occupational History   Not on file.   Social History Main Topics   Smoking status: Never Smoker    Smokeless tobacco: Not on file   Alcohol Use: Not on file   Drug Use: Not on file   Sexual Activity: Not on file   Other Topics Concern   Not on file   Social History Narrative     Review of Systems: Constitutional: positive for chills and subjective fever. negative for night sweats, weight changes, or fatigue  HEENT: positive for nasal congestion and sinus pressure. negative for vision changes, hearing loss, rhinorrhea, ST, or epistaxis. Cardiovascular: negative for chest pain or palpitations Respiratory: positive for cough and shortness of breath. negative for hemoptysis, or wheezing. Abdominal: negative for abdominal pain, nausea, vomiting, diarrhea, or constipation Dermatological: negative for rash Neurologic: positive for HA. negative for dizziness, or syncope All other systems reviewed and are otherwise negative with the exception to those above and in the HPI.   Physical Exam: Blood pressure 114/62, pulse 80, temperature 98.2 F (36.8 C), temperature source Oral, resp. rate 16, height 5\' 6"  (1.676 m), weight 209 lb (94.802 kg), SpO2 98 %., Body mass index is 33.75 kg/(m^2). General: Well developed, well nourished, in no acute distress. Pt is crying while being examed and blowing her nose.  Head: Normocephalic, atraumatic, eyes without discharge, sclera non-icteric, nares are without discharge. Bilateral auditory canals clear, TM's  are without perforation, pearly grey and translucent with reflective cone of light bilaterally. Oral cavity moist, posterior pharynx without exudate, erythema, peritonsillar abscess, or post nasal drip.  Neck: Supple. No thyromegaly. Full ROM. No lymphadenopathy. Lungs: rhonchi noted bilaterally. Clear bilaterally to auscultation without wheezes, or rales. Breathing is unlabored. Heart: RRR with S1 S2. No  murmurs, rubs, or gallops appreciated. Abdomen: Soft, non-tender, non-distended with normoactive bowel sounds. No hepatomegaly. No rebound/guarding. No obvious abdominal masses. Msk:  Strength and tone normal for age. Extremities/Skin: Warm and dry. No clubbing or cyanosis. No edema. No rashes or suspicious lesions. Neuro: Alert and oriented X 3. Moves all extremities spontaneously. Gait is normal. CNII-XII grossly in tact. Psych:  Responds to questions appropriately with a normal affect.   Labs: Results for orders placed or performed in visit on 10/17/15  Culture, Group A Strep  Result Value Ref Range   Organism ID, Bacteria Normal Upper Respiratory Flora    Organism ID, Bacteria No Beta Hemolytic Streptococci Isolated   POCT CBC  Result Value Ref Range   WBC 5.4 4.6 - 10.2 K/uL   Lymph, poc 1.7 0.6 - 3.4   POC LYMPH PERCENT 32.4 10 - 50 %L   MID (cbc) 0.4 0 - 0.9   POC MID % 7.9 0 - 12 %M   POC Granulocyte 3.2 2 - 6.9   Granulocyte percent 59.7 37 - 80 %G   RBC 4.45 4.04 - 5.48 M/uL   Hemoglobin 12.4 12.2 - 16.2 g/dL   HCT, POC 11.9 (A) 14.7 - 47.9 %   MCV 81.1 80 - 97 fL   MCH, POC 27.9 27 - 31.2 pg   MCHC 34.4 31.8 - 35.4 g/dL   RDW, POC 82.9 %   Platelet Count, POC 246 142 - 424 K/uL   MPV 7.7 0 - 99.8 fL  POCT Influenza A/B  Result Value Ref Range   Influenza A, POC Negative Negative   Influenza B, POC Negative Negative  POCT rapid strep A  Result Value Ref Range   Rapid Strep A Screen Negative Negative   Chest x-ray: Air bronchogram in the lower lobe, no definite infiltrate, cardiac shadow normal Results for orders placed or performed in visit on 10/23/15  POCT CBC  Result Value Ref Range   WBC 8.2 4.6 - 10.2 K/uL   Lymph, poc 1.5 0.6 - 3.4   POC LYMPH PERCENT 18.1 10 - 50 %L   MID (cbc) 0.5 0 - 0.9   POC MID % 6.7 0 - 12 %M   POC Granulocyte 6.2 2 - 6.9   Granulocyte percent 75.2 37 - 80 %G   RBC 4.21 4.04 - 5.48 M/uL   Hemoglobin 11.6 (A) 12.2 - 16.2 g/dL     HCT, POC 56.2 (A) 13.0 - 47.9 %   MCV 80.2 80 - 97 fL   MCH, POC 27.5 27 - 31.2 pg   MCHC 34.3 31.8 - 35.4 g/dL   RDW, POC 86.5 %   Platelet Count, POC 249 142 - 424 K/uL   MPV 7.5 0 - 99.8 fL     ASSESSMENT AND PLAN:  50 y.o. year old female with   1. Cough    Cough - Plan: DG Chest 2 View, POCT CBC, levofloxacin (LEVAQUIN) 500 MG tablet  Dyspnea - Plan: POCT CBC, levofloxacin (LEVAQUIN) 500 MG tablet  Persistent headaches - Plan: POCT CBC  Hypothyroidism, unspecified hypothyroidism type - Plan: POCT CBC  Other migraine without status migrainosus, not  intractable - Plan: POCT CBC  Acute bronchitis, unspecified organism - Plan: levofloxacin (LEVAQUIN) 500 MG tablet  This chart was scribed in my presence and reviewed by me personally.    Signed, Elvina Sidle, MD 10/23/2015 8:15 AM

## 2015-10-23 NOTE — Patient Instructions (Addendum)
You have a severe bronchial infection. You need be on antibiotics which I have ordered. Please take them 1 tablet daily. You also have cough syrup. You should be better in 48 hours, or call us/come back.    IF you received an x-ray today, you will receive an invoice from [facility]. Please contact [facility] at [phone] with questions or concerns regarding your invoice.   IF you received labwork today, you will receive an invoice from [facility]. Please contact [facility] at [phone] with questions or concerns regarding your invoice.   Our billing staff will not be able to assist you with questions regarding bills from these companies.  You will be contacted with the lab results as soon as they are available. The fastest way to get your results is to activate your My Chart account. Instructions are located on the last page of this paperwork. If you have not heard from us regarding the results in 2 weeks, please contact this office.

## 2015-10-26 ENCOUNTER — Other Ambulatory Visit: Payer: Self-pay | Admitting: Family Medicine

## 2015-10-26 ENCOUNTER — Ambulatory Visit
Admission: RE | Admit: 2015-10-26 | Discharge: 2015-10-26 | Disposition: A | Discharge status: Home / Self Care | Payer: BC Managed Care – PPO | Source: Ambulatory Visit | Attending: Family Medicine | Admitting: Family Medicine

## 2015-10-26 DIAGNOSIS — R05 Cough: Secondary | ICD-10-CM

## 2015-10-26 DIAGNOSIS — R059 Cough, unspecified: Secondary | ICD-10-CM

## 2016-02-24 DIAGNOSIS — K1230 Oral mucositis (ulcerative), unspecified: Secondary | ICD-10-CM | POA: Insufficient documentation

## 2016-03-22 DIAGNOSIS — L439 Lichen planus, unspecified: Secondary | ICD-10-CM | POA: Insufficient documentation

## 2016-08-29 ENCOUNTER — Other Ambulatory Visit: Payer: Self-pay | Admitting: Obstetrics and Gynecology

## 2016-08-29 ENCOUNTER — Other Ambulatory Visit: Payer: Self-pay | Admitting: Women's Health

## 2016-08-29 DIAGNOSIS — Z1231 Encounter for screening mammogram for malignant neoplasm of breast: Secondary | ICD-10-CM

## 2016-08-31 LAB — LIPID PANEL
Cholesterol: 185 mg/dL (ref 0–200)
HDL: 60 mg/dL (ref 35–70)
LDL CALC: 111 mg/dL
Triglycerides: 70 mg/dL (ref 40–160)

## 2016-08-31 LAB — TSH: TSH: 4.99

## 2016-10-09 ENCOUNTER — Ambulatory Visit: Payer: BC Managed Care – PPO

## 2016-10-30 ENCOUNTER — Ambulatory Visit
Admission: RE | Admit: 2016-10-30 | Discharge: 2016-10-30 | Disposition: A | Discharge status: Home / Self Care | Payer: BC Managed Care – PPO | Source: Ambulatory Visit | Attending: Obstetrics and Gynecology | Admitting: Obstetrics and Gynecology

## 2016-10-30 DIAGNOSIS — Z1231 Encounter for screening mammogram for malignant neoplasm of breast: Secondary | ICD-10-CM

## 2016-10-30 LAB — HM MAMMOGRAPHY

## 2016-12-27 ENCOUNTER — Ambulatory Visit (INDEPENDENT_AMBULATORY_CARE_PROVIDER_SITE_OTHER): Payer: BC Managed Care – PPO | Admitting: Adult Health

## 2016-12-27 ENCOUNTER — Encounter: Payer: Self-pay | Admitting: Adult Health

## 2016-12-27 DIAGNOSIS — K219 Gastro-esophageal reflux disease without esophagitis: Secondary | ICD-10-CM

## 2016-12-27 DIAGNOSIS — Z6826 Body mass index (BMI) 26.0-26.9, adult: Secondary | ICD-10-CM | POA: Insufficient documentation

## 2016-12-27 DIAGNOSIS — E669 Obesity, unspecified: Secondary | ICD-10-CM | POA: Diagnosis not present

## 2016-12-27 DIAGNOSIS — E039 Hypothyroidism, unspecified: Secondary | ICD-10-CM | POA: Diagnosis not present

## 2016-12-27 DIAGNOSIS — Z6831 Body mass index (BMI) 31.0-31.9, adult: Secondary | ICD-10-CM | POA: Insufficient documentation

## 2016-12-27 DIAGNOSIS — G43809 Other migraine, not intractable, without status migrainosus: Secondary | ICD-10-CM | POA: Diagnosis not present

## 2016-12-27 DIAGNOSIS — Z6835 Body mass index (BMI) 35.0-35.9, adult: Secondary | ICD-10-CM | POA: Insufficient documentation

## 2016-12-27 NOTE — Assessment & Plan Note (Signed)
Continue Topiramate 100mg  BID. Reports one migraine every 3-4 months.

## 2016-12-27 NOTE — Assessment & Plan Note (Signed)
Continue Levothyroxine 112mcg Last TSH 08/31/16- 4.990

## 2016-12-27 NOTE — Patient Instructions (Signed)
Heart-Healthy Eating Plan °Heart-healthy meal planning includes: °· Limiting unhealthy fats. °· Increasing healthy fats. °· Making other small dietary changes. °You may need to talk with your doctor or a diet specialist (dietitian) to create an eating plan that is right for you. °What types of fat should I choose? °· Choose healthy fats. These include olive oil and canola oil, flaxseeds, walnuts, almonds, and seeds. °· Eat more omega-3 fats. These include salmon, mackerel, sardines, tuna, flaxseed oil, and ground flaxseeds. Try to eat fish at least twice each week. °· Limit saturated fats. °¨ Saturated fats are often found in animal products, such as meats, butter, and cream. °¨ Plant sources of saturated fats include palm oil, palm kernel oil, and coconut oil. °· Avoid foods with partially hydrogenated oils in them. These include stick margarine, some tub margarines, cookies, crackers, and other baked goods. These contain trans fats. °What general guidelines do I need to follow? °· Check food labels carefully. Identify foods with trans fats or high amounts of saturated fat. °· Fill one half of your plate with vegetables and green salads. Eat 4-5 servings of vegetables per day. A serving of vegetables is: °¨ 1 cup of raw leafy vegetables. °¨ ½ cup of raw or cooked cut-up vegetables. °¨ ½ cup of vegetable juice. °· Fill one fourth of your plate with whole grains. Look for the word "whole" as the first word in the ingredient list. °· Fill one fourth of your plate with lean protein foods. °· Eat 4-5 servings of fruit per day. A serving of fruit is: °¨ One medium whole fruit. °¨ ¼ cup of dried fruit. °¨ ½ cup of fresh, frozen, or canned fruit. °¨ ½ cup of 100% fruit juice. °· Eat more foods that contain soluble fiber. These include apples, broccoli, carrots, beans, peas, and barley. Try to get 20-30 g of fiber per day. °· Eat more home-cooked food. Eat less restaurant, buffet, and fast food. °· Limit or avoid  alcohol. °· Limit foods high in starch and sugar. °· Avoid fried foods. °· Avoid frying your food. Try baking, boiling, grilling, or broiling it instead. You can also reduce fat by: °¨ Removing the skin from poultry. °¨ Removing all visible fats from meats. °¨ Skimming the fat off of stews, soups, and gravies before serving them. °¨ Steaming vegetables in water or broth. °· Lose weight if you are overweight. °· Eat 4-5 servings of nuts, legumes, and seeds per week: °¨ One serving of dried beans or legumes equals ½ cup after being cooked. °¨ One serving of nuts equals 1½ ounces. °¨ One serving of seeds equals ½ ounce or one tablespoon. °· You may need to keep track of how much salt or sodium you eat. This is especially true if you have high blood pressure. Talk with your doctor or dietitian to get more information. °What foods can I eat? °Grains  °Breads, including French, white, pita, wheat, raisin, rye, oatmeal, and Italian. Tortillas that are neither fried nor made with lard or trans fat. Low-fat rolls, including hotdog and hamburger buns and English muffins. Biscuits. Muffins. Waffles. Pancakes. Light popcorn. Whole-grain cereals. Flatbread. Melba toast. Pretzels. Breadsticks. Rusks. Low-fat snacks. Low-fat crackers, including oyster, saltine, matzo, graham, animal, and rye. Rice and pasta, including brown rice and pastas that are made with whole wheat. °Vegetables  °All vegetables. °Fruits  °All fruits, but limit coconut. °Meats and Other Protein Sources  °Lean, well-trimmed beef, veal, pork, and lamb. Chicken and turkey without skin. All   fish and shellfish. Wild duck, rabbit, pheasant, and venison. Egg whites or low-cholesterol egg substitutes. Dried beans, peas, lentils, and tofu. Seeds and most nuts. Dairy  Low-fat or nonfat cheeses, including ricotta, string, and mozzarella. Skim or 1% milk that is liquid, powdered, or evaporated. Buttermilk that is made with low-fat milk. Nonfat or low-fat  yogurt. Beverages  Mineral water. Diet carbonated beverages. Sweets and Desserts  Sherbets and fruit ices. Honey, jam, marmalade, jelly, and syrups. Meringues and gelatins. Pure sugar candy, such as hard candy, jelly beans, gumdrops, mints, marshmallows, and small amounts of dark chocolate. [employer]. Eat all sweets and desserts in moderation. Fats and Oils  Nonhydrogenated (trans-free) margarines. Vegetable oils, including soybean, sesame, sunflower, olive, peanut, safflower, corn, canola, and cottonseed. Salad dressings or mayonnaise made with a vegetable oil. Limit added fats and oils that you use for cooking, baking, salads, and as spreads. Other  Cocoa powder. Coffee and tea. All seasonings and condiments. The items listed above may not be a complete list of recommended foods or beverages. Contact your dietitian for more options.  What foods are not recommended? Grains  Breads that are made with saturated or trans fats, oils, or whole milk. Croissants. Butter rolls. Cheese breads. Sweet rolls. Donuts. Buttered popcorn. Chow mein noodles. High-fat crackers, such as cheese or butter crackers. Meats and Other Protein Sources  Fatty meats, such as hotdogs, short ribs, sausage, spareribs, bacon, rib eye roast or steak, and mutton. High-fat deli meats, such as salami and bologna. Caviar. Domestic duck and goose. Organ meats, such as kidney, liver, sweetbreads, and heart. Dairy  Cream, sour cream, cream cheese, and creamed cottage cheese. Whole-milk cheeses, including blue (bleu), [address], [place], [place], [address], [place], [address], cheddar, [place], and [place]. Whole or 2% milk that is liquid, evaporated, or condensed. Whole buttermilk. Cream sauce or high-fat cheese sauce. Yogurt that is made from whole milk. Beverages  Regular sodas and juice drinks with added sugar. Sweets and Desserts  Frosting. Pudding. Cookies. Cakes other than angel food cake. Candy that has milk chocolate or  white chocolate, hydrogenated fat, butter, coconut, or unknown ingredients. Buttered syrups. Full-fat ice cream or ice cream drinks. Fats and Oils  Gravy that has suet, meat fat, or shortening. Cocoa butter, hydrogenated oils, palm oil, coconut oil, palm kernel oil. These can often be found in baked products, candy, fried foods, nondairy creamers, and whipped toppings. Solid fats and shortenings, including bacon fat, salt pork, lard, and butter. Nondairy cream substitutes, such as coffee creamers and sour cream substitutes. Salad dressings that are made of unknown oils, cheese, or sour cream. The items listed above may not be a complete list of foods and beverages to avoid. Contact your dietitian for more information.  This information is not intended to replace advice given to you by your health care provider. Make sure you discuss any questions you have with your health care provider. Document Released: 01/16/2012 Document Revised: 12/23/2015 Document Reviewed: 01/08/2014 Elsevier Interactive Patient Education  2017 [employer].   Exercising to [employer] Exercising can help you to lose weight. In order to lose weight through exercise, you need to do vigorous-intensity exercise. You can tell that you are exercising with vigorous intensity if you are breathing very hard and fast and cannot hold a conversation while exercising. Moderate-intensity exercise helps to maintain your current weight. You can tell that you are exercising at a moderate level if you have a higher heart rate and faster breathing, but you are still  able to hold a conversation. How often should I exercise? Choose an activity that you enjoy and set realistic goals. Your health care provider can help you to make an activity plan that works for you. Exercise regularly as directed by your health care provider. This may include:  Doing resistance training twice each week, such as:  Push-ups.  Sit-ups.  Lifting  weights.  Using resistance bands.  Doing a given intensity of exercise for a given amount of time. Choose from these options:  150 minutes of moderate-intensity exercise every week.  75 minutes of vigorous-intensity exercise every week.  A mix of moderate-intensity and vigorous-intensity exercise every week. Children, pregnant women, people who are out of shape, people who are overweight, and older adults may need to consult a health care provider for individual recommendations. If you have any sort of medical condition, be sure to consult your health care provider before starting a new exercise program. What are some activities that can help me to lose weight?  Walking at a rate of at least 4.5 miles an hour.  Jogging or running at a rate of 5 miles per hour.  Biking at a rate of at least 10 miles per hour.  Lap swimming.  Roller-skating or in-line skating.  Cross-country skiing.  Vigorous competitive sports, such as football, basketball, and soccer.  Jumping rope.  Aerobic dancing. How can I be more active in my day-to-day activities?  Use the stairs instead of the elevator.  Take a walk during your lunch break.  If you drive, park your car farther away from work or school.  If you take public transportation, get off one stop early and walk the rest of the way.  Make all of your phone calls while standing up and walking around.  Get up, stretch, and walk around every 30 minutes throughout the day. What guidelines should I follow while exercising?  Do not exercise so much that you hurt yourself, feel dizzy, or get very short of breath.  Consult your health care provider prior to starting a new exercise program.  Wear comfortable clothes and shoes with good support.  Drink plenty of water while you exercise to prevent dehydration or heat stroke. Body water is lost during exercise and must be replaced.  Work out until you breathe faster and your heart beats  faster. This information is not intended to replace advice given to you by your health care provider. Make sure you discuss any questions you have with your health care provider. Document Released: 08/19/2010 Document Revised: 12/23/2015 Document Reviewed: 12/18/2013 Elsevier Interactive Patient Education  2017 [employer]   Continue all medications as directed. Increase regular movement to at least 7430mins/5 times a week. Annual follow-up, sooner if needed.

## 2016-12-27 NOTE — Assessment & Plan Note (Signed)
>>  ASSESSMENT AND PLAN FOR BMI 35.0-35.9,ADULT WRITTEN ON 12/27/2016  3:04 PM BY DANFORD, KATY D, NP  BMI today 35.64 Goal weight <200 lbs Increase regular movement to at least 10mins/5 times a week (i.e. Walking, swimming, stationary bike). Follow Heart Healthy Diet, reduce sugar intake.

## 2016-12-27 NOTE — Assessment & Plan Note (Signed)
BMI today 35.64 Goal weight <200 lbs Increase regular movement to at least 230mins/5 times a week (i.e. Walking, swimming, stationary bike). Follow Heart Healthy Diet, reduce sugar intake.

## 2016-12-27 NOTE — Assessment & Plan Note (Signed)
Continue omeprazole 20 mg daily. Avoid acidic foods.

## 2016-12-27 NOTE — Progress Notes (Addendum)
Subjective:    Patient ID: Karen Gross, female    DOB: 08/18/1965, 51 y.o.   MRN: [id number]  HPI:  Karen Gross presents to establish as a new pt.  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  PMH:  Migraines, hypothyroidism, depression, GERD, and Lichen Planus.  She reports migraines well controlled by Topamax, estimates to have one migraine every 3-4 months.  She is trying to loss weight via [employer]!  Goal wt <200.  She denies tobacco/ETOH use.   Patient Care Team    Relationship Specialty Notifications Start End  Karen Gross, Karen Marseille D, NP PCP - General Family Medicine  12/27/16     Patient Active Problem List   Diagnosis Date Noted  . GERD (gastroesophageal reflux disease) 12/27/2016  . Hypothyroidism 10/23/2015  . Migraine 10/23/2015     Past Medical History:  Diagnosis Date  . Hypothyroidism      Past Surgical History:  Procedure Laterality Date  . ABDOMINAL HYSTERECTOMY       Family History  Problem Relation Age of Onset  . Breast cancer Mother   . Breast cancer Paternal Grandmother      History  Drug use: Unknown     History  Alcohol use Not on file     History  Smoking Status  . Never Smoker  Smokeless Tobacco  . Never Used     Outpatient Encounter Prescriptions as of 12/27/2016  Medication Sig  . ARIPiprazole (ABILIFY) 10 MG tablet Take 10 mg by mouth daily.  . chlorpheniramine-HYDROcodone (TUSSIONEX PENNKINETIC ER) 10-8 MG/5ML SUER Take 5 mLs by mouth every 12 (twelve) hours as needed for cough.  . levothyroxine (SYNTHROID, LEVOTHROID) 112 MCG tablet Take 112 mcg by mouth daily before breakfast.  . topiramate (TOPAMAX) 100 MG tablet Take 100 mg by mouth 2 (two) times daily.  [name]. omeprazole (PRILOSEC) 20 MG capsule Take 20 mg by mouth daily.  . [DISCONTINUED] levofloxacin (LEVAQUIN) 500 MG tablet Take 1 tablet (500 mg total) by mouth daily.   No facility-administered encounter medications on file as of  12/27/2016.     Allergies: Patient has no known allergies.  Body mass index is 35.64 kg/m.  Blood pressure 108/62, pulse 71, height 5\' 6"  (1.676 m), weight 220 lb 12.8 oz (100.2 kg).  Review of Systems  Constitutional: Positive for fatigue. Negative for activity change, appetite change, chills, diaphoresis, fever and unexpected weight change.  Eyes: Negative for visual disturbance.  Respiratory: Negative for cough, chest tightness, shortness of breath, wheezing and stridor.   Cardiovascular: Negative for chest pain, palpitations and leg swelling.  Gastrointestinal: Negative for abdominal distention, abdominal pain, blood in stool, constipation, diarrhea, nausea and vomiting.  Endocrine: Negative for cold intolerance, heat intolerance, polydipsia, polyphagia and polyuria.  Genitourinary: Negative for difficulty urinating, flank pain and hematuria.  Musculoskeletal: Positive for arthralgias and myalgias. Negative for back pain, gait problem, joint swelling, neck pain and neck stiffness.  Skin: Negative for color change, pallor, rash and wound.  Neurological: Positive for headaches. Negative for dizziness and weakness.  Hematological: Does not bruise/bleed easily.  Psychiatric/Behavioral: Negative for dysphoric mood and sleep disturbance. The patient is not nervous/anxious.        Objective:   Physical Exam  Constitutional: She is oriented to person, place, and time. She appears well-developed and well-nourished. No distress.  HENT:  Head: Normocephalic and atraumatic.  Right Ear: Hearing, tympanic membrane, external ear and ear canal normal.  No decreased hearing is noted.  Left Ear: Tympanic membrane, external ear and ear canal normal. No decreased hearing is noted.  Nose: Nose normal. Right sinus exhibits no maxillary sinus tenderness and no frontal sinus tenderness. Left sinus exhibits no maxillary sinus tenderness and no frontal sinus tenderness.  Mouth/Throat: Uvula is midline.   Eyes: Conjunctivae are normal. Pupils are equal, round, and reactive to light.  Neck: Normal range of motion. Neck supple.  Cardiovascular: Normal rate, regular rhythm, normal heart sounds and intact distal pulses.   No murmur heard. Pulmonary/Chest: Effort normal and breath sounds normal. No respiratory distress. She has no wheezes. She has no rales. She exhibits no tenderness.  Lymphadenopathy:    She has no cervical adenopathy.  Neurological: She is alert and oriented to person, place, and time. Coordination normal.  Skin: Skin is warm and dry. No rash noted. She is not diaphoretic. No erythema. No pallor.  Psychiatric: She has a normal mood and affect. Her behavior is normal. Judgment and thought content normal.  Nursing note and vitals reviewed.         Assessment & Plan:   1. Other migraine without status migrainosus, not intractable   2. Hypothyroidism, unspecified type   3. Gastroesophageal reflux disease, esophagitis presence not specified   4. Obesity (BMI 35.0-39.9 without comorbidity)     Migraine Continue Topiramate 100mg  BID. Reports one migraine every 3-4 months.   Hypothyroidism Continue Levothyroxine Last TSH 08/31/16- 4.990  GERD (gastroesophageal reflux disease) Continue omeprazole 20 mg daily. Avoid acidic foods.  Obesity (BMI 35.0-39.9 without comorbidity) BMI today 35.64 Goal weight <200 lbs Increase regular movement to at least 62mins/5 times a week (i.e. Walking, swimming, stationary bike). Follow Heart Healthy Diet, reduce sugar intake.     FOLLOW-UP:  Return in about 1 year (around 12/27/2017) for Regular Follow Up.

## 2017-01-17 ENCOUNTER — Other Ambulatory Visit: Payer: Self-pay

## 2017-01-17 ENCOUNTER — Other Ambulatory Visit: Payer: Self-pay | Admitting: Adult Health

## 2017-01-17 DIAGNOSIS — G43809 Other migraine, not intractable, without status migrainosus: Secondary | ICD-10-CM

## 2017-01-17 MED ORDER — TOPIRAMATE 100 MG PO TABS: 100.0000 mg | ORAL_TABLET | Freq: Two times a day (BID) | ORAL | 2 refills | Status: DC

## 2017-01-17 NOTE — Telephone Encounter (Signed)
Informed patient topiramate was sent to [facility].

## 2017-01-17 NOTE — Telephone Encounter (Signed)
Pharmacy sent over refill request for Topiramate 100 mg tablet directions 1 an 1/2 tablets every morning and 1 and 1/2 tablets every evening.  This medication has not been written by our office yet please advised.  MPulliam, CMA

## 2017-01-17 NOTE — Telephone Encounter (Signed)
Afternoon, Please call [name] and let her know that I sent in RF for requested med. Thanks! Orpha BurKaty

## 2017-02-06 ENCOUNTER — Other Ambulatory Visit: Payer: Self-pay | Admitting: Adult Health

## 2017-02-06 ENCOUNTER — Telehealth: Payer: Self-pay | Admitting: Adult Health

## 2017-02-06 DIAGNOSIS — E039 Hypothyroidism, unspecified: Secondary | ICD-10-CM

## 2017-02-06 MED ORDER — LEVOTHYROXINE SODIUM 112 MCG PO TABS: 112.0000 ug | ORAL_TABLET | Freq: Every day | ORAL | 1 refills | Status: DC

## 2017-02-06 NOTE — Telephone Encounter (Signed)
Informed patient her thyroid medication was sent to [facility].

## 2017-02-06 NOTE — Telephone Encounter (Signed)
Pt called states she has had pharmacy send us request for Rx refil of the Synthroid/ Levothryroxine 112 MCG tablets but no response from provider-- Advised that Pharmacy was probably sending request to Prior provider and not to us-- [facility]ing [facility] to [facility] for refill to be sent to [facility] today if possible.--glh

## 2017-02-06 NOTE — Telephone Encounter (Signed)
90 day supply sent in with one RF. Will need TSH level checked 08/2017

## 2017-02-07 ENCOUNTER — Telehealth: Payer: Self-pay | Admitting: Adult Health

## 2017-02-07 MED ORDER — LEVOTHYROXINE SODIUM 125 MCG PO TABS: 125.0000 ug | ORAL_TABLET | Freq: Every day | ORAL | 0 refills | Status: DC

## 2017-02-07 NOTE — Telephone Encounter (Signed)
Karen Gross,  After looking in patient's chart looks like she has been on 112 mcg can you please verify this for me. And if there was any change in the medication? Thanks  MPulliam, CMA/RT(R)

## 2017-02-07 NOTE — Telephone Encounter (Addendum)
Called [facility] to confirm patient levothyroxine dose.  They say she had 125mcg filled 6/19 and started 125mcg dose 3/19, 112 was last filled in January.  So patient should currently be taking 125mcg?

## 2017-02-07 NOTE — Telephone Encounter (Signed)
Patient states that she had been taking 125 mcg levothyroxine but the refill she got yesterday states 122 mcg. She wants to speak with someone today to understand the dosage change. She will be going out of town tomorrow and would like some insight today if possible.

## 2017-02-07 NOTE — Telephone Encounter (Signed)
Afternoon, According to the last few OVs in system it lists that she has been taking Levothyroxine 112mcg not 125mcg.  I sent RF in off of system listed Levothyroxine 112mcg. Can you please call dispensing pharmacy to verify that she received 112mcg dose, then call pt to clarify. Thanks! Orpha BurKaty

## 2017-04-02 ENCOUNTER — Other Ambulatory Visit: Payer: Self-pay | Admitting: Adult Health

## 2017-04-18 ENCOUNTER — Ambulatory Visit (INDEPENDENT_AMBULATORY_CARE_PROVIDER_SITE_OTHER): Payer: BC Managed Care – PPO | Admitting: Adult Health

## 2017-04-18 ENCOUNTER — Encounter: Payer: Self-pay | Admitting: Adult Health

## 2017-04-18 DIAGNOSIS — H6123 Impacted cerumen, bilateral: Secondary | ICD-10-CM | POA: Insufficient documentation

## 2017-04-18 DIAGNOSIS — E669 Obesity, unspecified: Secondary | ICD-10-CM | POA: Diagnosis not present

## 2017-04-18 NOTE — Assessment & Plan Note (Signed)
>>  ASSESSMENT AND PLAN FOR BMI 35.0-35.9,ADULT WRITTEN ON 04/18/2017 10:14 AM BY DANFORD, KATY D, NP  Increase water intake, strive for at least 110 ounces/day.   Follow Heart Healthy diet Increase regular exercise.  Recommend at least 30 minutes daily, 5 days per week of walking, jogging, biking, swimming, YouTube/Pinterest workout videos. F/u in 4 weeks to discuss/start medical wt loss-YOU CAN DO IT!

## 2017-04-18 NOTE — Assessment & Plan Note (Signed)
Both ears lavaged and cerumen removed and hearing returned to normal/basline. Recommended to schedule nurse visits quarterly for ear lavage. She has never used tobacco.

## 2017-04-18 NOTE — Progress Notes (Signed)
Subjective:    Patient ID: Karen Gross, female    DOB: 11-Mar-1966, 51 y.o.   MRN: [id number]  HPI:  Karen Gross presents with excessive cerumen buildup in both ears L> R.  She reports decrease in hearing in L ear.  She has hx of excessive cerumen and requires "flush-out" few times/year. She denies nasal drainage/cough/HA/dizziness/change in hearing. She is also concerned about her weight. She drinks sweat tea all day and denies regular exercise. She denies tobacco/ETOH use. 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 She states "I need to stop being fat".  Patient Care Team    Relationship Specialty Notifications Start End  William Hamburger D, NP PCP - General Family Medicine  12/27/16   Nita Sells, MD  Dermatology  12/27/16     Patient Active Problem List   Diagnosis Date Noted  . Excessive cerumen in ear canal, bilateral 04/18/2017  . GERD (gastroesophageal reflux disease) 12/27/2016  . Obesity (BMI 35.0-39.9 without comorbidity) 12/27/2016  . Hypothyroidism 10/23/2015  . Migraine 10/23/2015     Past Medical History:  Diagnosis Date  . Hypothyroidism      Past Surgical History:  Procedure Laterality Date  . ABDOMINAL HYSTERECTOMY       Family History  Problem Relation Age of Onset  . Breast cancer Mother   . Breast cancer Paternal Grandmother      History  Drug Use No     History  Alcohol Use  . Yes    Comment: once a month- wine     History  Smoking Status  . Never Smoker  Smokeless Tobacco  . Never Used     Outpatient Encounter Prescriptions as of 04/18/2017  Medication Sig  . ARIPiprazole (ABILIFY) 10 MG tablet Take 10 mg by mouth daily.  [name] levothyroxine (SYNTHROID, LEVOTHROID) 125 MCG tablet Take 1 tablet (125 mcg total) by mouth daily before breakfast.  . omeprazole (PRILOSEC) 20 MG capsule Take 20 mg by mouth daily.  [name] topiramate (TOPAMAX) 100 MG tablet TAKE 1 TABLET BY MOUTH TWICE A  DAY  . [DISCONTINUED] chlorpheniramine-HYDROcodone (TUSSIONEX PENNKINETIC ER) 10-8 MG/5ML SUER Take 5 mLs by mouth every 12 (twelve) hours as needed for cough.   No facility-administered encounter medications on file as of 04/18/2017.     Allergies: Patient has no known allergies.  Body mass index is 35.9 kg/m.  Blood pressure 111/64, pulse 73, height  (1.676 m), weight 222 lb 6.4 oz (100.9 kg).     Review of Systems  Constitutional: Positive for fatigue. Negative for activity change, appetite change, chills, diaphoresis, fever and unexpected weight change.  HENT: Positive for hearing loss. Negative for congestion, ear discharge, ear pain, facial swelling, postnasal drip, sinus pain, sinus pressure, sneezing, sore throat, trouble swallowing and voice change.   Eyes: Negative for visual disturbance.  Respiratory: Negative for cough, chest tightness, shortness of breath, wheezing and stridor.   Cardiovascular: Negative for chest pain, palpitations and leg swelling.  Endocrine: Negative for cold intolerance, heat intolerance, polydipsia, polyphagia and polyuria.  Neurological: Negative for dizziness and headaches.  Hematological: Does not bruise/bleed easily.       Objective:   Physical Exam  Constitutional: She appears well-developed and well-nourished. No distress.  HENT:  Head: Normocephalic and atraumatic.  Right Ear: Hearing and external ear normal. Tympanic membrane is not erythematous and not bulging. No decreased hearing is noted.  Left Ear: Hearing and external ear normal.  Tympanic membrane is not erythematous and not bulging. No decreased hearing is noted.  Nose: Nose normal.  Mouth/Throat: Oropharynx is clear and moist.  Initially- L ear completely impacted with cerumen             R ear 70% impacted with cerumen             Decreased hearing in L ear Both ears lavaged, then: TMs easily visualized and cerumen removed. Hearing normal/at baseline in both  ears. She tolerated lavage well.  Eyes: Pupils are equal, round, and reactive to light. Conjunctivae are normal.  Neck: Normal range of motion. Neck supple.  Lymphadenopathy:    She has no cervical adenopathy.  Skin: Skin is warm and dry. No rash noted. She is not diaphoretic. No erythema. No pallor.  Psychiatric: She has a normal mood and affect. Her behavior is normal. Judgment and thought content normal.  Nursing note and vitals reviewed.         Assessment & Plan:   1. Obesity (BMI 35.0-39.9 without comorbidity)   2. Excessive cerumen in ear canal, bilateral     Obesity (BMI 35.0-39.9 without comorbidity) Increase water intake, strive for at least 110 ounces/day.   Follow Heart Healthy diet Increase regular exercise.  Recommend at least 30 minutes daily, 5 days per week of walking, jogging, biking, swimming, YouTube/Pinterest workout videos. F/u in 4 weeks to discuss/start medical wt loss-YOU CAN DO IT!  Excessive cerumen in ear canal, bilateral Both ears lavaged and cerumen removed and hearing returned to normal/basline. Recommended to schedule nurse visits quarterly for ear lavage. She has never used tobacco.    FOLLOW-UP:  Return in about 4 weeks (around 05/16/2017) for Medical Weight Loss.

## 2017-04-18 NOTE — Patient Instructions (Signed)
Earwax Buildup, Adult The ears produce a substance called earwax that helps keep bacteria out of the ear and protects the skin in the ear canal. Occasionally, earwax can build up in the ear and cause discomfort or hearing loss. What increases the risk? This condition is more likely to develop in people who:  Are female.  Are elderly.  Naturally produce more earwax.  Clean their ears often with cotton swabs.  Use earplugs often.  Use in-ear headphones often.  Wear hearing aids.  Have narrow ear canals.  Have earwax that is overly thick or sticky.  Have eczema.  Are dehydrated.  Have excess hair in the ear canal.  What are the signs or symptoms? Symptoms of this condition include:  Reduced or muffled hearing.  A feeling of fullness in the ear or feeling that the ear is plugged.  Fluid coming from the ear.  Ear pain.  Ear itch.  Ringing in the ear.  Coughing.  An obvious piece of earwax that can be seen inside the ear canal.  How is this diagnosed? This condition may be diagnosed based on:  Your symptoms.  Your medical history.  An ear exam. During the exam, your health care provider will look into your ear with an instrument called an otoscope.  You may have tests, including a hearing test. How is this treated? This condition may be treated by:  Using ear drops to soften the earwax.  Having the earwax removed by a health care provider. The health care provider may: ? Flush the ear with water. ? Use an instrument that has a loop on the end (curette). ? Use a suction device.  Surgery to remove the wax buildup. This may be done in severe cases.  Follow these instructions at home:  Take over-the-counter and prescription medicines only as told by your health care provider.  Do not put any objects, including cotton swabs, into your ear. You can clean the opening of your ear canal with a washcloth or facial tissue.  Follow instructions from your health  care provider about cleaning your ears. Do not over-clean your ears.  Drink enough fluid to keep your urine clear or pale yellow. This will help to thin the earwax.  Keep all follow-up visits as told by your health care provider. If earwax builds up in your ears often or if you use hearing aids, consider seeing your health care provider for routine, preventive ear cleanings. Ask your health care provider how often you should schedule your cleanings.  If you have hearing aids, clean them according to instructions from the manufacturer and your health care provider. Contact a health care provider if:  You have ear pain.  You develop a fever.  You have blood, pus, or other fluid coming from your ear.  You have hearing loss.  You have ringing in your ears that does not go away.  Your symptoms do not improve with treatment.  You feel like the room is spinning (vertigo). Summary  Earwax can build up in the ear and cause discomfort or hearing loss.  The most common symptoms of this condition include reduced or muffled hearing and a feeling of fullness in the ear or feeling that the ear is plugged.  This condition may be diagnosed based on your symptoms, your medical history, and an ear exam.  This condition may be treated by using ear drops to soften the earwax or by having the earwax removed by a health care provider.  Do   not put any objects, including cotton swabs, into your ear. You can clean the opening of your ear canal with a washcloth or facial tissue. This information is not intended to replace advice given to you by your health care provider. Make sure you discuss any questions you have with your health care provider. Document Released: 08/24/2004 Document Revised: 09/27/2016 Document Reviewed: 09/27/2016 Elsevier Interactive Patient Education  2018 [employer].   Exercising to [employer] Exercising can help you to lose weight. In order to lose weight through exercise, you  need to do vigorous-intensity exercise. You can tell that you are exercising with vigorous intensity if you are breathing very hard and fast and cannot hold a conversation while exercising. Moderate-intensity exercise helps to maintain your current weight. You can tell that you are exercising at a moderate level if you have a higher heart rate and faster breathing, but you are still able to hold a conversation. How often should I exercise? Choose an activity that you enjoy and set realistic goals. Your health care provider can help you to make an activity plan that works for you. Exercise regularly as directed by your health care provider. This may include:  Doing resistance training twice each week, such as: ? Push-ups. ? Sit-ups. ? Lifting weights. ? Using resistance bands.  Doing a given intensity of exercise for a given amount of time. Choose from these options: ? 150 minutes of moderate-intensity exercise every week. ? 75 minutes of vigorous-intensity exercise every week. ? A mix of moderate-intensity and vigorous-intensity exercise every week.  Children, pregnant women, people who are out of shape, people who are overweight, and older adults may need to consult a health care provider for individual recommendations. If you have any sort of medical condition, be sure to consult your health care provider before starting a new exercise program. What are some activities that can help me to lose weight?  Walking at a rate of at least 4.5 miles an hour.  Jogging or running at a rate of 5 miles per hour.  Biking at a rate of at least 10 miles per hour.  Lap swimming.  Roller-skating or in-line skating.  Cross-country skiing.  Vigorous competitive sports, such as football, basketball, and soccer.  Jumping rope.  Aerobic dancing. How can I be more active in my day-to-day activities?  Use the stairs instead of the elevator.  Take a walk during your lunch break.  If you drive,  park your car farther away from work or school.  If you take public transportation, get off one stop early and walk the rest of the way.  Make all of your phone calls while standing up and walking around.  Get up, stretch, and walk around every 30 minutes throughout the day. What guidelines should I follow while exercising?  Do not exercise so much that you hurt yourself, feel dizzy, or get very short of breath.  Consult your health care provider prior to starting a new exercise program.  Wear comfortable clothes and shoes with good support.  Drink plenty of water while you exercise to prevent dehydration or heat stroke. Body water is lost during exercise and must be replaced.  Work out until you breathe faster and your heart beats faster. This information is not intended to replace advice given to you by your health care provider. Make sure you discuss any questions you have with your health care provider. Document Released: 08/19/2010 Document Revised: 12/23/2015 Document Reviewed: 12/18/2013 Elsevier Interactive Patient Education  2018 [employer]   Please schedule quarterly nurse visits for "ear flush". Increase water intake, strive for at least 110 ounces/day.   Follow Heart Healthy diet Increase regular exercise.  Recommend at least 30 minutes daily, 5 days per week of walking, jogging, biking, swimming, YouTube/Pinterest workout videos. Please schedule follow-up in 4 weeks to discuss/start medical wt loss. NICE TO SEE YOU!

## 2017-04-18 NOTE — Assessment & Plan Note (Signed)
Increase water intake, strive for at least 110 ounces/day.   Follow Heart Healthy diet Increase regular exercise.  Recommend at least 30 minutes daily, 5 days per week of walking, jogging, biking, swimming, YouTube/Pinterest workout videos. F/u in 4 weeks to discuss/start medical wt loss-YOU CAN DO IT!

## 2017-05-22 NOTE — Progress Notes (Signed)
Subjective:    Patient ID: Karen Gross, female    DOB: Jul 29, 1966, 51 y.o.   MRN: [id number]  HPI:  04/18/2017 OV Notes: Karen Gross presents with excessive cerumen buildup in both ears L> R.  She reports decrease in hearing in L ear.  She has hx of excessive cerumen and requires "flush-out" few times/year. She denies nasal drainage/cough/HA/dizziness/change in hearing. She is also concerned about her weight. She drinks sweat tea all day and denies regular exercise. She denies tobacco/ETOH use. 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 She states "I need to stop being fat".  Today's Ov Notes: Karen Gross is here for f/u: we discussed starting phentermine after 4 weeks of TLC to loss weight. She has been increasing water intake, daily walking, reducing food portion size and increasing amount of fruits/vegetables consumed daily. She feels "like my jeans are fitting better". She has lost 8 lbs since last OV-GREAT!  Patient Care Team    Relationship Specialty Notifications Start End  William Hamburger D, NP PCP - General Family Medicine  12/27/16   Nita Sells, MD  Dermatology  12/27/16     Patient Active Problem List   Diagnosis Date Noted  . Excessive cerumen in ear canal, bilateral 04/18/2017  . GERD (gastroesophageal reflux disease) 12/27/2016  . Obesity (BMI 35.0-39.9 without comorbidity) 12/27/2016  . Hypothyroidism 10/23/2015  . Migraine 10/23/2015     Past Medical History:  Diagnosis Date  . Hypothyroidism      Past Surgical History:  Procedure Laterality Date  . ABDOMINAL HYSTERECTOMY       Family History  Problem Relation Age of Onset  . Breast cancer Mother   . Breast cancer Paternal Grandmother      History  Drug Use No     History  Alcohol Use  . Yes    Comment: once a month- wine     History  Smoking Status  . Never Smoker  Smokeless Tobacco  . Never Used     Outpatient Encounter  Prescriptions as of 05/23/2017  Medication Sig  . ARIPiprazole (ABILIFY) 10 MG tablet Take 10 mg by mouth daily.  [name] levothyroxine (SYNTHROID, LEVOTHROID) 125 MCG tablet Take 1 tablet (125 mcg total) by mouth daily before breakfast.  . omeprazole (PRILOSEC) 20 MG capsule Take 20 mg by mouth daily.  [name] topiramate (TOPAMAX) 100 MG tablet Take 100 mg by mouth. Take 2 tablets every morning and 1 tablet every evening  . [DISCONTINUED] topiramate (TOPAMAX) 100 MG tablet TAKE 1 TABLET BY MOUTH TWICE A DAY   No facility-administered encounter medications on file as of 05/23/2017.     Allergies: Patient has no known allergies.  Body mass index is 34.59 kg/m.  Blood pressure 113/68, pulse 89, height 5\' 6"  (1.676 m), weight 214 lb 4.8 oz (97.2 kg).     Review of Systems  Constitutional: Positive for fatigue. Negative for activity change, appetite change, chills, diaphoresis, fever and unexpected weight change.  HENT: Negative for congestion, ear discharge, ear pain, facial swelling, hearing loss, postnasal drip, sinus pain, sinus pressure, sneezing, sore throat, trouble swallowing and voice change.   Eyes: Negative for visual disturbance.  Respiratory: Negative for cough, chest tightness, shortness of breath, wheezing and stridor.   Cardiovascular: Negative for chest pain, palpitations and leg swelling.  Endocrine: Negative for cold intolerance, heat intolerance, polydipsia, polyphagia and polyuria.  Neurological: Negative for dizziness and headaches.  Hematological: Does not  bruise/bleed easily.       Objective:   Physical Exam  Constitutional: She appears well-developed and well-nourished. No distress.  HENT:  Head: Normocephalic and atraumatic.  Right Ear: Hearing and external ear normal. No decreased hearing is noted.  Left Ear: Hearing and external ear normal. Tympanic membrane is not erythematous. No decreased hearing is noted.  Eyes: Pupils are equal, round, and reactive to light.  Conjunctivae are normal.  Neck: Normal range of motion. Neck supple.  Lymphadenopathy:    She has no cervical adenopathy.  Skin: Skin is warm and dry. No rash noted. She is not diaphoretic. No erythema. No pallor.  Psychiatric: She has a normal mood and affect. Her behavior is normal. Judgment and thought content normal.  Nursing note and vitals reviewed.         Assessment & Plan:   1. Obesity (BMI 35.0-39.9 without comorbidity)     Obesity (BMI 35.0-39.9 without comorbidity) BP 113/68, HR 89 Wt 9/19/218- 222 Wt today 214 8 lb wt loss!!!! [place] Controlled Substance Database reviewed- no contraindications noted. 2 months of phentermine provided. Instructed to check BP and if consistently >140/90 please call clinic.     FOLLOW-UP:  No Follow-up on file. No diagnosis found.  No problem-specific Assessment & Plan notes found for this encounter.    FOLLOW-UP:  No Follow-up on file.

## 2017-05-23 ENCOUNTER — Ambulatory Visit (INDEPENDENT_AMBULATORY_CARE_PROVIDER_SITE_OTHER): Payer: BC Managed Care – PPO | Admitting: Adult Health

## 2017-05-23 ENCOUNTER — Encounter: Payer: Self-pay | Admitting: Adult Health

## 2017-05-23 DIAGNOSIS — E669 Obesity, unspecified: Secondary | ICD-10-CM | POA: Diagnosis not present

## 2017-05-23 MED ORDER — PHENTERMINE HCL 37.5 MG PO TABS: 37.5000 mg | ORAL_TABLET | Freq: Every day | ORAL | 0 refills | Status: DC

## 2017-05-23 NOTE — Assessment & Plan Note (Signed)
BP 113/68, HR 89 Wt 9/19/218- 222 Wt today 214 8 lb wt loss!!!! [place] Controlled Substance Database reviewed- no contraindications noted. 2 months of phentermine provided. Instructed to check BP and if consistently >140/90 please call clinic.

## 2017-05-23 NOTE — Assessment & Plan Note (Signed)
>>  ASSESSMENT AND PLAN FOR BMI 35.0-35.9,ADULT WRITTEN ON 05/23/2017  8:23 AM BY DANFORD, KATY D, NP  BP 113/68, HR 89 Wt 9/19/218- 222 Wt today 214 8 lb wt loss!!!! [place] Controlled Substance Database reviewed- no contraindications noted. 2 months of phentermine provided. Instructed to check BP and if consistently >140/90 please call clinic.

## 2017-05-23 NOTE — Patient Instructions (Signed)
Heart-Healthy Eating Plan Many factors influence your heart health, including eating and exercise habits. Heart (coronary) risk increases with abnormal blood fat (lipid) levels. Heart-healthy meal planning includes limiting unhealthy fats, increasing healthy fats, and making other small dietary changes. This includes maintaining a healthy body weight to help keep lipid levels within a normal range. What is my plan? Your health care provider recommends that you:  Get no more than __25__% of the total calories in your daily diet from fat.  Limit your intake of saturated fat to less than ___5__% of your total calories each day.  Limit the amount of cholesterol in your diet to less than __300__ mg per day.  What types of fat should I choose?  Choose healthy fats more often. Choose monounsaturated and polyunsaturated fats, such as olive oil and canola oil, flaxseeds, walnuts, almonds, and seeds.  Eat more omega-3 fats. Good choices include salmon, mackerel, sardines, tuna, flaxseed oil, and ground flaxseeds. Aim to eat fish at least two times each week.  Limit saturated fats. Saturated fats are primarily found in animal products, such as meats, butter, and cream. Plant sources of saturated fats include palm oil, palm kernel oil, and coconut oil.  Avoid foods with partially hydrogenated oils in them. These contain trans fats. Examples of foods that contain trans fats are stick margarine, some tub margarines, cookies, crackers, and other baked goods. What general guidelines do I need to follow?  Check food labels carefully to identify foods with trans fats or high amounts of saturated fat.  Fill one half of your plate with vegetables and green salads. Eat 4-5 servings of vegetables per day. A serving of vegetables equals 1 cup of raw leafy vegetables,  cup of raw or cooked cut-up vegetables, or  cup of vegetable juice.  Fill one fourth of your plate with whole grains. Look for the word "whole"  as the first word in the ingredient list.  Fill one fourth of your plate with lean protein foods.  Eat 4-5 servings of fruit per day. A serving of fruit equals one medium whole fruit,  cup of dried fruit,  cup of fresh, frozen, or canned fruit, or  cup of 100% fruit juice.  Eat more foods that contain soluble fiber. Examples of foods that contain this type of fiber are apples, broccoli, carrots, beans, peas, and barley. Aim to get 20-30 g of fiber per day.  Eat more home-cooked food and less restaurant, buffet, and fast food.  Limit or avoid alcohol.  Limit foods that are high in starch and sugar.  Avoid fried foods.  Cook foods by using methods other than frying. Baking, boiling, grilling, and broiling are all great options. Other fat-reducing suggestions include: ? Removing the skin from poultry. ? Removing all visible fats from meats. ? Skimming the fat off of stews, soups, and gravies before serving them. ? Steaming vegetables in water or broth.  Lose weight if you are overweight. Losing just 5-10% of your initial body weight can help your overall health and prevent diseases such as diabetes and heart disease.  Increase your consumption of nuts, legumes, and seeds to 4-5 servings per week. One serving of dried beans or legumes equals  cup after being cooked, one serving of nuts equals 1 ounces, and one serving of seeds equals  ounce or 1 tablespoon.  You may need to monitor your salt (sodium) intake, especially if you have high blood pressure. Talk with your health care provider or dietitian to get  more information about reducing sodium. What foods can I eat? Grains  Breads, including [place], white, pita, wheat, raisin, rye, oatmeal, and [place]. Tortillas that are neither fried nor made with lard or trans fat. Low-fat rolls, including hotdog and hamburger buns and English muffins. Biscuits. Muffins. Waffles. Pancakes. Light popcorn. Whole-grain cereals. Flatbread. Melba  toast. Pretzels. Breadsticks. Rusks. Low-fat snacks and crackers, including oyster, saltine, matzo, graham, animal, and rye. Rice and pasta, including brown rice and those that are made with whole wheat. Vegetables All vegetables. Fruits All fruits, but limit coconut. Meats and Other Protein Sources Lean, well-trimmed beef, veal, pork, and lamb. Chicken and [place] without skin. All fish and shellfish. Wild duck, rabbit, pheasant, and venison. Egg whites or low-cholesterol egg substitutes. Dried beans, peas, lentils, and tofu.Seeds and most nuts. Dairy Low-fat or nonfat cheeses, including ricotta, string, and mozzarella. Skim or 1% milk that is liquid, powdered, or evaporated. Buttermilk that is made with low-fat milk. Nonfat or low-fat yogurt. Beverages Mineral water. Diet carbonated beverages. Sweets and Desserts Sherbets and fruit ices. Honey, jam, marmalade, jelly, and syrups. Meringues and gelatins. Pure sugar candy, such as hard candy, jelly beans, gumdrops, mints, marshmallows, and small amounts of dark chocolate. [employer] Eat all sweets and desserts in moderation. Fats and Oils Nonhydrogenated (trans-free) margarines. Vegetable oils, including soybean, sesame, sunflower, olive, peanut, safflower, corn, canola, and cottonseed. Salad dressings or mayonnaise that are made with a vegetable oil. Limit added fats and oils that you use for cooking, baking, salads, and as spreads. Other Cocoa powder. Coffee and tea. All seasonings and condiments. The items listed above may not be a complete list of recommended foods or beverages. Contact your dietitian for more options. What foods are not recommended? Grains Breads that are made with saturated or trans fats, oils, or whole milk. Croissants. Butter rolls. Cheese breads. Sweet rolls. Donuts. Buttered popcorn. Chow mein noodles. High-fat crackers, such as cheese or butter crackers. Meats and Other Protein Sources Fatty meats, such as  hotdogs, short ribs, sausage, spareribs, bacon, ribeye roast or steak, and mutton. High-fat deli meats, such as salami and bologna. Caviar. Domestic duck and goose. Organ meats, such as kidney, liver, sweetbreads, brains, gizzard, chitterlings, and heart. Dairy Cream, sour cream, cream cheese, and creamed cottage cheese. Whole milk cheeses, including blue (bleu), Monterey Jack, [place], [place], American, [place], Swiss, [place], [place], and [place]. Whole or 2% milk that is liquid, evaporated, or condensed. Whole buttermilk. Cream sauce or high-fat cheese sauce. Yogurt that is made from whole milk. Beverages Regular sodas and drinks with added sugar. Sweets and Desserts Frosting. Pudding. Cookies. Cakes other than angel food cake. Candy that has milk chocolate or white chocolate, hydrogenated fat, butter, coconut, or unknown ingredients. Buttered syrups. Full-fat ice cream or ice cream drinks. Fats and Oils Gravy that has suet, meat fat, or shortening. Cocoa butter, hydrogenated oils, palm oil, coconut oil, palm kernel oil. These can often be found in baked products, candy, fried foods, nondairy creamers, and whipped toppings. Solid fats and shortenings, including bacon fat, salt pork, lard, and butter. Nondairy cream substitutes, such as coffee creamers and sour cream substitutes. Salad dressings that are made of unknown oils, cheese, or sour cream. The items listed above may not be a complete list of foods and beverages to avoid. Contact your dietitian for more information. This information is not intended to replace advice given to you by your health care provider. Make sure you discuss any questions you have with your health care  provider. Document Released: 04/25/2008 Document Revised: 02/04/2016 Document Reviewed: 01/08/2014 Elsevier Interactive Patient Education  2017 [employer].   Exercising to [employer] Exercising can help you to lose weight. In order to lose weight through exercise,  you need to do vigorous-intensity exercise. You can tell that you are exercising with vigorous intensity if you are breathing very hard and fast and cannot hold a conversation while exercising. Moderate-intensity exercise helps to maintain your current weight. You can tell that you are exercising at a moderate level if you have a higher heart rate and faster breathing, but you are still able to hold a conversation. How often should I exercise? Choose an activity that you enjoy and set realistic goals. Your health care provider can help you to make an activity plan that works for you. Exercise regularly as directed by your health care provider. This may include:  Doing resistance training twice each week, such as: ? Push-ups. ? Sit-ups. ? Lifting weights. ? Using resistance bands.  Doing a given intensity of exercise for a given amount of time. Choose from these options: ? 150 minutes of moderate-intensity exercise every week. ? 75 minutes of vigorous-intensity exercise every week. ? A mix of moderate-intensity and vigorous-intensity exercise every week.  Children, pregnant women, people who are out of shape, people who are overweight, and older adults may need to consult a health care provider for individual recommendations. If you have any sort of medical condition, be sure to consult your health care provider before starting a new exercise program. What are some activities that can help me to lose weight?  Walking at a rate of at least 4.5 miles an hour.  Jogging or running at a rate of 5 miles per hour.  Biking at a rate of at least 10 miles per hour.  Lap swimming.  Roller-skating or in-line skating.  Cross-country skiing.  Vigorous competitive sports, such as football, basketball, and soccer.  Jumping rope.  Aerobic dancing. How can I be more active in my day-to-day activities?  Use the stairs instead of the elevator.  Take a walk during your lunch break.  If you drive,  park your car farther away from work or school.  If you take public transportation, get off one stop early and walk the rest of the way.  Make all of your phone calls while standing up and walking around.  Get up, stretch, and walk around every 30 minutes throughout the day. What guidelines should I follow while exercising?  Do not exercise so much that you hurt yourself, feel dizzy, or get very short of breath.  Consult your health care provider prior to starting a new exercise program.  Wear comfortable clothes and shoes with good support.  Drink plenty of water while you exercise to prevent dehydration or heat stroke. Body water is lost during exercise and must be replaced.  Work out until you breathe faster and your heart beats faster. This information is not intended to replace advice given to you by your health care provider. Make sure you discuss any questions you have with your health care provider. Document Released: 08/19/2010 Document Revised: 12/23/2015 Document Reviewed: 12/18/2013 Elsevier Interactive Patient Education  2018 [employer].  Phentermine tablets or capsules What is this medicine? PHENTERMINE (FEN ter meen) decreases your appetite. It is used with a reduced calorie diet and exercise to help you lose weight. This medicine may be used for other purposes; ask your health care provider or pharmacist if you have  questions. COMMON BRAND NAME(S): Adipex-P, Atti-Plex P, Atti-Plex P Spansule, Fastin, Lomaira, Pro-Fast, Tara-8 What should I tell my health care provider before I take this medicine? They need to know if you have any of these conditions: -agitation -glaucoma -heart disease -high blood pressure -history of substance abuse -lung disease called Primary Pulmonary Hypertension (PPH) -taken an MAOI like Carbex, Eldepryl, Marplan, Nardil, or Parnate in last 14 days -thyroid disease -an unusual or allergic reaction to phentermine, other medicines, foods,  dyes, or preservatives -pregnant or trying to get pregnant -breast-feeding How should I use this medicine? Take this medicine by mouth with a glass of water. Follow the directions on the prescription label. The instructions for use may differ based on the product and dose you are taking. Avoid taking this medicine in the evening. It may interfere with sleep. Take your doses at regular intervals. Do not take your medicine more often than directed. Talk to your pediatrician regarding the use of this medicine in children. While this drug may be prescribed for children 17 years or older for selected conditions, precautions do apply. Overdosage: If you think you have taken too much of this medicine contact a poison control center or emergency room at once. NOTE: This medicine is only for you. Do not share this medicine with others. What if I miss a dose? If you miss a dose, take it as soon as you can. If it is almost time for your next dose, take only that dose. Do not take double or extra doses. What may interact with this medicine? Do not take this medicine with any of the following medications: -duloxetine -MAOIs like Carbex, Eldepryl, Marplan, Nardil, and Parnate -medicines for colds or breathing difficulties like pseudoephedrine or phenylephrine -procarbazine -sibutramine -SSRIs like citalopram, escitalopram, fluoxetine, fluvoxamine, paroxetine, and sertraline -stimulants like dexmethylphenidate, methylphenidate or modafinil -venlafaxine This medicine may also interact with the following medications: -medicines for diabetes This list may not describe all possible interactions. Give your health care provider a list of all the medicines, herbs, non-prescription drugs, or dietary supplements you use. Also tell them if you smoke, drink alcohol, or use illegal drugs. Some items may interact with your medicine. What should I watch for while using this medicine? Notify your physician immediately if  you become short of breath while doing your normal activities. Do not take this medicine within 6 hours of bedtime. It can keep you from getting to sleep. Avoid drinks that contain caffeine and try to stick to a regular bedtime every night. This medicine was intended to be used in addition to a healthy diet and exercise. The best results are achieved this way. This medicine is only indicated for short-term use. Eventually your weight loss may level out. At that point, the drug will only help you maintain your new weight. Do not increase or in any way change your dose without consulting your doctor. You may get drowsy or dizzy. Do not drive, use machinery, or do anything that needs mental alertness until you know how this medicine affects you. Do not stand or sit up quickly, especially if you are an older patient. This reduces the risk of dizzy or fainting spells. Alcohol may increase dizziness and drowsiness. Avoid alcoholic drinks. What side effects may I notice from receiving this medicine? Side effects that you should report to your doctor or health care professional as soon as possible: -chest pain, palpitations -depression or severe changes in mood -increased blood pressure -irritability -nervousness or restlessness -severe dizziness -shortness  of breath -problems urinating -unusual swelling of the legs -vomiting Side effects that usually do not require medical attention (report to your doctor or health care professional if they continue or are bothersome): -blurred vision or other eye problems -changes in sexual ability or desire -constipation or diarrhea -difficulty sleeping -dry mouth or unpleasant taste -headache -nausea This list may not describe all possible side effects. Call your doctor for medical advice about side effects. You may report side effects to FDA at 1-800-FDA-1088. Where should I keep my medicine? Keep out of the reach of children. This medicine can be abused. Keep  your medicine in a safe place to protect it from theft. Do not share this medicine with anyone. Selling or giving away this medicine is dangerous and against the law. This medicine may cause accidental overdose and death if taken by other adults, children, or pets. Mix any unused medicine with a substance like cat litter or coffee grounds. Then throw the medicine away in a sealed container like a sealed bag or a coffee can with a lid. Do not use the medicine after the expiration date. Store at room temperature between 20 and 25 degrees C (68 and 77 degrees F). Keep container tightly closed. NOTE: This sheet is a summary. It may not cover all possible information. If you have questions about this medicine, talk to your doctor, pharmacist, or health care provider.  2018 Elsevier/Gold Standard (2015-04-23 12:53:15)  GREAT GREAT GREAT on the weight loss! Increase water intake, strive for at least 110 ounces/day.   Follow Heart Healthy diet Increase regular exercise.  Recommend at least 30 minutes daily, 5 days per week of walking, jogging, biking, swimming, YouTube/Pinterest workout videos. Please take phentermine as directed. Please check blood pressure several times weekly, and if consistently >140/90 please call clinic.  Follow-up in 2 months, sooner if needed. GREATTTT JOB!!! SO PROUD OF YOU!

## 2017-06-04 ENCOUNTER — Other Ambulatory Visit: Payer: Self-pay | Admitting: Adult Health

## 2017-06-04 NOTE — Telephone Encounter (Signed)
TSH d/t be rechecked 08/2017

## 2017-06-13 ENCOUNTER — Other Ambulatory Visit: Payer: Self-pay | Admitting: Adult Health

## 2017-06-13 MED ORDER — TOPIRAMATE 100 MG PO TABS: 100.0000 mg | ORAL_TABLET | Freq: Two times a day (BID) | ORAL | 2 refills | Status: DC

## 2017-06-13 NOTE — Telephone Encounter (Signed)
We have not prescribed these medications for the patient previously.  Please review and refill if appropriate.  T. Shloime Keilman, CMA  

## 2017-06-13 NOTE — Telephone Encounter (Signed)
Afternoon [name], I previously rx'd this medication Topiramate 100mg  BID in June and Sept. I sent in rx with 2 refills, please let the pt know. Thanks! Orpha BurKaty

## 2017-06-13 NOTE — Telephone Encounter (Signed)
Noted.  T. Laramie Gelles, CMA 

## 2017-06-29 DIAGNOSIS — N959 Unspecified menopausal and perimenopausal disorder: Secondary | ICD-10-CM | POA: Insufficient documentation

## 2017-06-29 DIAGNOSIS — G40909 Epilepsy, unspecified, not intractable, without status epilepticus: Secondary | ICD-10-CM | POA: Insufficient documentation

## 2017-07-02 ENCOUNTER — Other Ambulatory Visit: Payer: Self-pay

## 2017-07-02 MED ORDER — ARIPIPRAZOLE 10 MG PO TABS: 10.0000 mg | ORAL_TABLET | Freq: Every day | ORAL | 0 refills | Status: DC

## 2017-07-02 NOTE — Telephone Encounter (Signed)
We have not prescribed these medications for the patient previously.  Please review and refill if appropriate.  T. Nelson, CMA  

## 2017-07-05 ENCOUNTER — Telehealth: Payer: Self-pay

## 2017-07-05 NOTE — Telephone Encounter (Signed)
Pt called stating that her insurance will not pay for topirimate because its an "early fill" because the RX is written for BID, rather than how she has "always taken it" which is 2 tablets qam and 1 tablet qpm.  Please advise.  Tiajuana Amass. Webster Patrone, CMA

## 2017-07-09 ENCOUNTER — Other Ambulatory Visit: Payer: Self-pay | Admitting: Adult Health

## 2017-07-09 MED ORDER — TOPIRAMATE 100 MG PO TABS: ORAL_TABLET | ORAL | 2 refills | Status: DC

## 2017-07-09 NOTE — Telephone Encounter (Signed)
Morning Karen Gross, System updated to 2 tabs in am and 1 tab in pm- RF sent in. Please call pt and let her know. Thanks! Orpha BurKaty

## 2017-07-10 NOTE — Telephone Encounter (Signed)
Pt informed.  T. Nelson, CMA 

## 2017-07-25 ENCOUNTER — Ambulatory Visit: Payer: BC Managed Care – PPO | Admitting: Adult Health

## 2017-07-25 ENCOUNTER — Encounter: Payer: Self-pay | Admitting: Adult Health

## 2017-07-25 VITALS — BP 115/80 | HR 81 | Ht 66.0 in | Wt 203.5 lb

## 2017-07-25 DIAGNOSIS — G43809 Other migraine, not intractable, without status migrainosus: Secondary | ICD-10-CM | POA: Diagnosis not present

## 2017-07-25 DIAGNOSIS — E669 Obesity, unspecified: Secondary | ICD-10-CM

## 2017-07-25 MED ORDER — PHENTERMINE HCL 37.5 MG PO TABS: 37.5000 mg | ORAL_TABLET | Freq: Every day | ORAL | 0 refills | Status: DC

## 2017-07-25 MED ORDER — TOPIRAMATE 100 MG PO TABS: 100.0000 mg | ORAL_TABLET | Freq: Two times a day (BID) | ORAL | 2 refills | Status: DC

## 2017-07-25 NOTE — Assessment & Plan Note (Signed)
>>  ASSESSMENT AND PLAN FOR BMI 35.0-35.9,ADULT WRITTEN ON 07/25/2017 11:24 AM BY DANFORD, KATY D, NP  Continue excellent water intake, strive for at least 100 ounces/day.   Follow Heart Healthy diet Increase regular exercise.  Recommend at least 30 minutes daily, 5 days per week of walking, jogging, biking, swimming, YouTube/Pinterest workout videos. Continue all medications as directed, one change: Topiramate 100mg  one tab in am and one tab in evening. [place] Controlled Substance Database reviewed- no contraindications noted. BP at goal denies med SE- two months med provided. EXCELLENT JOB ON THE WEIGHT LOSS! Follow-up in 2 months, sooner if needed.

## 2017-07-25 NOTE — Assessment & Plan Note (Addendum)
Continue excellent water intake, strive for at least 100 ounces/day.   Follow Heart Healthy diet Increase regular exercise.  Recommend at least 30 minutes daily, 5 days per week of walking, jogging, biking, swimming, YouTube/Pinterest workout videos. Continue all medications as directed, one change: Topiramate 100mg  one tab in am and one tab in evening. [place] Controlled Substance Database reviewed- no contraindications noted. BP at goal denies med SE- two months med provided. EXCELLENT JOB ON THE WEIGHT LOSS! Follow-up in 2 months, sooner if needed.

## 2017-07-25 NOTE — Patient Instructions (Signed)
Exercising to Lose Weight Exercising can help you to lose weight. In order to lose weight through exercise, you need to do vigorous-intensity exercise. You can tell that you are exercising with vigorous intensity if you are breathing very hard and fast and cannot hold a conversation while exercising. Moderate-intensity exercise helps to maintain your current weight. You can tell that you are exercising at a moderate level if you have a higher heart rate and faster breathing, but you are still able to hold a conversation. How often should I exercise? Choose an activity that you enjoy and set realistic goals. Your health care provider can help you to make an activity plan that works for you. Exercise regularly as directed by your health care provider. This may include:  Doing resistance training twice each week, such as: ? Push-ups. ? Sit-ups. ? Lifting weights. ? Using resistance bands.  Doing a given intensity of exercise for a given amount of time. Choose from these options: ? 150 minutes of moderate-intensity exercise every week. ? 75 minutes of vigorous-intensity exercise every week. ? A mix of moderate-intensity and vigorous-intensity exercise every week.  Children, pregnant women, people who are out of shape, people who are overweight, and older adults may need to consult a health care provider for individual recommendations. If you have any sort of medical condition, be sure to consult your health care provider before starting a new exercise program. What are some activities that can help me to lose weight?  Walking at a rate of at least 4.5 miles an hour.  Jogging or running at a rate of 5 miles per hour.  Biking at a rate of at least 10 miles per hour.  Lap swimming.  Roller-skating or in-line skating.  Cross-country skiing.  Vigorous competitive sports, such as football, basketball, and soccer.  Jumping rope.  Aerobic dancing. How can I be more active in my day-to-day  activities?  Use the stairs instead of the elevator.  Take a walk during your lunch break.  If you drive, park your car farther away from work or school.  If you take public transportation, get off one stop early and walk the rest of the way.  Make all of your phone calls while standing up and walking around.  Get up, stretch, and walk around every 30 minutes throughout the day. What guidelines should I follow while exercising?  Do not exercise so much that you hurt yourself, feel dizzy, or get very short of breath.  Consult your health care provider prior to starting a new exercise program.  Wear comfortable clothes and shoes with good support.  Drink plenty of water while you exercise to prevent dehydration or heat stroke. Body water is lost during exercise and must be replaced.  Work out until you breathe faster and your heart beats faster. This information is not intended to replace advice given to you by your health care provider. Make sure you discuss any questions you have with your health care provider. Document Released: 08/19/2010 Document Revised: 12/23/2015 Document Reviewed: 12/18/2013 Elsevier Interactive Patient Education  2018 [employer]  Continue excellent water intake, strive for at least 100 ounces/day.   Follow Heart Healthy diet Increase regular exercise.  Recommend at least 30 minutes daily, 5 days per week of walking, jogging, biking, swimming, YouTube/Pinterest workout videos. Continue all medications as directed, one change: Topiramate 100mg  one tab in am and one tab in evening. EXCELLENT JOB ON THE WEIGHT LOSS! Follow-up in 2 months, sooner if needed.  NICE TO SEE YOU!

## 2017-07-25 NOTE — Progress Notes (Signed)
Subjective:    Patient ID: Karen Gross, female    DOB: 10/12/1965, 51 y.o.   MRN: [id number]  HPI:  04/18/2017 OV Notes: Karen Gross presents with excessive cerumen buildup in both ears L> R.  She reports decrease in hearing in L ear.  She has hx of excessive cerumen and requires "flush-out" few times/year. She denies nasal drainage/cough/HA/dizziness/change in hearing. She is also concerned about her weight. She drinks sweat tea all day and denies regular exercise. She denies tobacco/ETOH use. 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 She states "I need to stop being fat".  05/23/17 OV: Karen Gross is here for f/u: we discussed starting phentermine after 4 weeks of TLC to loss weight. She has been increasing water intake, daily walking, reducing food portion size and increasing amount of fruits/vegetables consumed daily. She feels "like my jeans are fitting better". She has lost 8 lbs since last OV-GREAT!  07/25/17 OV: Wt today: 203#, wt at least OV 10/18: 214# 11 lb wt loss- GREAT!!!!  Karen Gross is here for f/u: medical wt loss.  Patient Care Team    Relationship Specialty Notifications Start End  William Hamburgeranford, Veryl Abril D, NP PCP - General Family Medicine  12/27/16   Nita SellsHall, John, MD  Dermatology  12/27/16     Patient Active Problem List   Diagnosis Date Noted  . Excessive cerumen in ear canal, bilateral 04/18/2017  . GERD (gastroesophageal reflux disease) 12/27/2016  . Obesity (BMI 35.0-39.9 without comorbidity) 12/27/2016  . Hypothyroidism 10/23/2015  . Migraine 10/23/2015     Past Medical History:  Diagnosis Date  . Hypothyroidism      Past Surgical History:  Procedure Laterality Date  . ABDOMINAL HYSTERECTOMY       Family History  Problem Relation Age of Onset  . Breast cancer Mother   . Breast cancer Paternal Grandmother      Social History   Substance and Sexual Activity  Drug Use No     Social  History   Substance and Sexual Activity  Alcohol Use Yes   Comment: once a month- wine     Social History   Tobacco Use  Smoking Status Never Smoker  Smokeless Tobacco Never Used     Outpatient Encounter Medications as of 07/25/2017  Medication Sig  . ARIPiprazole (ABILIFY) 10 MG tablet Take 1 tablet (10 mg total) by mouth daily.  [name]. levothyroxine (SYNTHROID, LEVOTHROID) 125 MCG tablet TAKE 1 TABLET (125 MCG TOTAL) BY MOUTH DAILY BEFORE BREAKFAST.  [name]. omeprazole (PRILOSEC) 20 MG capsule Take 20 mg by mouth daily.  Melene Muller. [START ON 08/25/2017] phentermine (ADIPEX-P) 37.5 MG tablet Take 1 tablet (37.5 mg total) by mouth daily before breakfast.  . topiramate (TOPAMAX) 100 MG tablet Take 1 tablet (100 mg total) by mouth 2 (two) times daily.  . [DISCONTINUED] phentermine (ADIPEX-P) 37.5 MG tablet Take 1 tablet (37.5 mg total) by mouth daily before breakfast.  . [DISCONTINUED] phentermine (ADIPEX-P) 37.5 MG tablet Take 1 tablet (37.5 mg total) by mouth daily before breakfast.  . [DISCONTINUED] topiramate (TOPAMAX) 100 MG tablet 2 tabs in am, 1 tab in pm   No facility-administered encounter medications on file as of 07/25/2017.     Allergies: Patient has no known allergies.  Body mass index is 32.85 kg/m.  Blood pressure 115/80, pulse 81, height 5\' 6"  (1.676 m), weight 203 lb 8 oz (92.3 kg), SpO2 100 %.     Review of  Systems  Constitutional: Positive for fatigue. Negative for activity change, appetite change, chills, diaphoresis, fever and unexpected weight change.  HENT: Negative for congestion, ear discharge, ear pain, facial swelling, hearing loss, postnasal drip, sinus pressure, sinus pain, sneezing, sore throat, trouble swallowing and voice change.   Eyes: Negative for visual disturbance.  Respiratory: Negative for cough, chest tightness, shortness of breath, wheezing and stridor.   Cardiovascular: Negative for chest pain, palpitations and leg swelling.  Endocrine: Negative for  cold intolerance, heat intolerance, polydipsia, polyphagia and polyuria.  Neurological: Negative for dizziness and headaches.  Hematological: Does not bruise/bleed easily.       Objective:   Physical Exam  Constitutional: She is oriented to person, place, and time. She appears well-developed and well-nourished. No distress.  HENT:  Head: Normocephalic and atraumatic.  Right Ear: No decreased hearing is noted.  Left Ear: No decreased hearing is noted.  Eyes: Conjunctivae are normal. Pupils are equal, round, and reactive to light.  Cardiovascular: Normal rate, regular rhythm, normal heart sounds and intact distal pulses.  No murmur heard. Pulmonary/Chest: Effort normal and breath sounds normal. No respiratory distress. She has no wheezes. She has no rales. She exhibits no tenderness.  Neurological: She is alert and oriented to person, place, and time. Coordination normal.  Skin: Skin is warm and dry. No rash noted. She is not diaphoretic. No erythema. No pallor.  Psychiatric: She has a normal mood and affect. Her behavior is normal. Judgment and thought content normal.  Nursing note and vitals reviewed.         Assessment & Plan:   1. Obesity (BMI 35.0-39.9 without comorbidity)   2. Other migraine without status migrainosus, not intractable     Obesity (BMI 35.0-39.9 without comorbidity) Continue excellent water intake, strive for at least 100 ounces/day.   Follow Heart Healthy diet Increase regular exercise.  Recommend at least 30 minutes daily, 5 days per week of walking, jogging, biking, swimming, YouTube/Pinterest workout videos. Continue all medications as directed, one change: Topiramate 100mg  one tab in am and one tab in evening. [place] Controlled Substance Database reviewed- no contraindications noted. BP at goal denies med SE- two months med provided. EXCELLENT JOB ON THE WEIGHT LOSS! Follow-up in 2 months, sooner if needed.  Migraine Continue all  medications as directed, one change: Topiramate 100mg  one tab in am and one tab in evening. We can further discuss tapering med off in future.      FOLLOW-UP:  Return in about 2 months (around 09/25/2017) for Regular Follow Up, Medical Weight Loss. 1. Obesity (BMI 35.0-39.9 without comorbidity)   2. Other migraine without status migrainosus, not intractable

## 2017-07-25 NOTE — Assessment & Plan Note (Signed)
Continue all medications as directed, one change: Topiramate 100mg  one tab in am and one tab in evening. We can further discuss tapering med off in future.

## 2017-09-04 ENCOUNTER — Other Ambulatory Visit: Payer: Self-pay | Admitting: Adult Health

## 2017-09-04 ENCOUNTER — Telehealth: Payer: Self-pay

## 2017-09-04 NOTE — Telephone Encounter (Signed)
LVM for pt instructing her to come fasting to next OV to obtain labs.  Karen Gross. Makaylynn Bonillas, CMA

## 2017-09-24 NOTE — Progress Notes (Deleted)
Subjective:    Patient ID: Karen Clickarolyn S Mark, female    DOB: 12/31/1965, 52 y.o.   MRN: [id number]  HPI:  04/18/2017 OV Notes: Karen Gross presents with excessive cerumen buildup in both ears L> R.  She reports decrease in hearing in L ear.  She has hx of excessive cerumen and requires "flush-out" few times/year. She denies nasal drainage/cough/HA/dizziness/change in hearing. She is also concerned about her weight. She drinks sweat tea all day and denies regular exercise. She denies tobacco/ETOH use. 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 She states "I need to stop being fat".  05/23/17 OV: Karen Gross is here for f/u: we discussed starting phentermine after 4 weeks of TLC to loss weight. She has been increasing water intake, daily walking, reducing food portion size and increasing amount of fruits/vegetables consumed daily. She feels "like my jeans are fitting better". She has lost 8 lbs since last OV-GREAT!  07/25/17 OV: Wt today: 203#, wt at least OV 10/18: 214# 11 lb wt loss- GREAT!!!!  Karen Gross is here for f/u: medical wt loss.  09/25/17 OV: Karen Gross is here for f/u on wt loss  Patient Care Team    Relationship Specialty Notifications Start End  Julaine Gross, Karen Casteneda Gross, Karen Gross PCP - General Family Medicine  12/27/16   Nita SellsHall, John, MD  Dermatology  12/27/16     Patient Active Problem List   Diagnosis Date Noted  . Excessive cerumen in ear canal, bilateral 04/18/2017  . GERD (gastroesophageal reflux disease) 12/27/2016  . Obesity (BMI 35.0-39.9 without comorbidity) 12/27/2016  . Hypothyroidism 10/23/2015  . Migraine 10/23/2015     Past Medical History:  Diagnosis Date  . Hypothyroidism      Past Surgical History:  Procedure Laterality Date  . ABDOMINAL HYSTERECTOMY       Family History  Problem Relation Age of Onset  . Breast cancer Mother   . Breast cancer Paternal Grandmother      Social History    Substance and Sexual Activity  Drug Use No     Social History   Substance and Sexual Activity  Alcohol Use Yes   Comment: once a month- wine     Social History   Tobacco Use  Smoking Status Never Smoker  Smokeless Tobacco Never Used     Outpatient Encounter Medications as of 09/25/2017  Medication Sig  . ARIPiprazole (ABILIFY) 10 MG tablet Take 1 tablet (10 mg total) by mouth daily.  [name]. levothyroxine (SYNTHROID, LEVOTHROID) 125 MCG tablet TAKE 1 TABLET (125 MCG TOTAL) BY MOUTH DAILY BEFORE BREAKFAST.  [name]. omeprazole (PRILOSEC) 20 MG capsule Take 20 mg by mouth daily.  . phentermine (ADIPEX-P) 37.5 MG tablet Take 1 tablet (37.5 mg total) by mouth daily before breakfast.  . topiramate (TOPAMAX) 100 MG tablet Take 1 tablet (100 mg total) by mouth 2 (two) times daily.   No facility-administered encounter medications on file as of 09/25/2017.     Allergies: Patient has no known allergies.  There is no height or weight on file to calculate BMI.  There were no vitals taken for this visit.     Review of Systems  Constitutional: Positive for fatigue. Negative for activity change, appetite change, chills, diaphoresis, fever and unexpected weight change.  HENT: Negative for congestion, ear discharge, ear pain, facial swelling, hearing loss, postnasal drip, sinus pressure, sinus pain, sneezing, sore throat, trouble swallowing and voice change.   Eyes: Negative for visual  disturbance.  Respiratory: Negative for cough, chest tightness, shortness of breath, wheezing and stridor.   Cardiovascular: Negative for chest pain, palpitations and leg swelling.  Endocrine: Negative for cold intolerance, heat intolerance, polydipsia, polyphagia and polyuria.  Neurological: Negative for dizziness and headaches.  Hematological: Does not bruise/bleed easily.       Objective:   Physical Exam  Constitutional: She is oriented to person, place, and time. She appears well-developed and  well-nourished. No distress.  HENT:  Head: Normocephalic and atraumatic.  Right Ear: No decreased hearing is noted.  Left Ear: No decreased hearing is noted.  Eyes: Conjunctivae are normal. Pupils are equal, round, and reactive to light.  Cardiovascular: Normal rate, regular rhythm, normal heart sounds and intact distal pulses.  No murmur heard. Pulmonary/Chest: Effort normal and breath sounds normal. No respiratory distress. She has no wheezes. She has no rales. She exhibits no tenderness.  Neurological: She is alert and oriented to person, place, and time. Coordination normal.  Skin: Skin is warm and dry. No rash noted. She is not diaphoretic. No erythema. No pallor.  Psychiatric: She has a normal mood and affect. Her behavior is normal. Judgment and thought content normal.  Nursing note and vitals reviewed.         Assessment & Plan:   No diagnosis found.  No problem-specific Assessment & Plan notes found for this encounter.    FOLLOW-UP:  No Follow-up on file. No diagnosis found.

## 2017-09-25 ENCOUNTER — Ambulatory Visit: Payer: BC Managed Care – PPO | Admitting: Adult Health

## 2017-09-25 NOTE — Progress Notes (Addendum)
Subjective:    Patient ID: Karen Gross, female    DOB: 12-14-1965, 52 y.o.   MRN: [id number]  HPI:  04/18/2017 OV Notes: Ms. Karen Gross presents with excessive cerumen buildup in both ears L> R.  She reports decrease in hearing in L ear.  She has hx of excessive cerumen and requires "flush-out" few times/year. She denies nasal drainage/cough/HA/dizziness/change in hearing. She is also concerned about her weight. She drinks sweat tea all day and denies regular exercise. She denies tobacco/ETOH use. 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 She states "I need to stop being fat".  05/23/17 OV: Ms. Karen Gross is here for f/u: we discussed starting phentermine after 4 weeks of TLC to loss weight. She has been increasing water intake, daily walking, reducing food portion size and increasing amount of fruits/vegetables consumed daily. She feels "like my jeans are fitting better". She has lost 8 lbs since last OV-GREAT!  07/25/17 OV: Wt today: 203#, wt at last OV 10/18: 214# 11 lb wt loss- GREAT!!!!  Ms. Karen Gross is here for f/u: medical wt loss.  09/26/17 OV: Ms. Karen Gross is here for medical wt loss.  She has been taking phentermine 37.5mg  once daily since Oct 2018- total of four months thus far. Wt today: 196, wt at last OV 203 7 lbs wt loss since last OV She drinks 4-5 16 oz bottle of water Eats a diet high in vegetables and lean protein She has dramatically reduced CHO/sugar intake She walks > 60 mins/day She denies CP/dyspnea/palpitations/insomnia   Patient Care Team    Relationship Specialty Notifications Start End  William Hamburger D, NP PCP - General Family Medicine  12/27/16   Nita Sells, MD  Dermatology  12/27/16     Patient Active Problem List   Diagnosis Date Noted  . Healthcare maintenance 09/27/2017  . Excessive cerumen in ear canal, bilateral 04/18/2017  . GERD (gastroesophageal reflux disease) 12/27/2016  . Obesity  (BMI 35.0-39.9 without comorbidity) 12/27/2016  . Hypothyroidism 10/23/2015  . Migraine 10/23/2015     Past Medical History:  Diagnosis Date  . Hypothyroidism      Past Surgical History:  Procedure Laterality Date  . ABDOMINAL HYSTERECTOMY       Family History  Problem Relation Age of Onset  . Breast cancer Mother   . Breast cancer Paternal Grandmother      Social History   Substance and Sexual Activity  Drug Use No     Social History   Substance and Sexual Activity  Alcohol Use Yes   Comment: once a month- wine     Social History   Tobacco Use  Smoking Status Never Smoker  Smokeless Tobacco Never Used     Outpatient Encounter Medications as of 09/27/2017  Medication Sig  . omeprazole (PRILOSEC) 20 MG capsule Take 20 mg by mouth daily.  [name] topiramate (TOPAMAX) 100 MG tablet Take 1 tablet (100 mg total) by mouth 2 (two) times daily.  . [DISCONTINUED] ARIPiprazole (ABILIFY) 10 MG tablet Take 1 tablet (10 mg total) by mouth daily.  . [DISCONTINUED] levothyroxine (SYNTHROID, LEVOTHROID) 125 MCG tablet TAKE 1 TABLET (125 MCG TOTAL) BY MOUTH DAILY BEFORE BREAKFAST.  . [DISCONTINUED] phentermine (ADIPEX-P) 37.5 MG tablet Take 1 tablet (37.5 mg total) by mouth daily before breakfast.  . [DISCONTINUED] phentermine (ADIPEX-P) 37.5 MG tablet Take 1 tablet (37.5 mg total) by mouth daily before breakfast.  . [DISCONTINUED] phentermine (ADIPEX-P) 37.5 MG tablet Take  1 tablet (37.5 mg total) by mouth daily before breakfast.   No facility-administered encounter medications on file as of 09/27/2017.     Allergies: Patient has no known allergies.  Body mass index is 31.64 kg/m.  Blood pressure 100/66, pulse 72, height 5\' 6"  (1.676 m), weight 196 lb (88.9 kg), SpO2 98 %.     Review of Systems  Constitutional: Positive for fatigue. Negative for activity change, appetite change, chills, diaphoresis, fever and unexpected weight change.  HENT: Negative for congestion,  ear discharge, ear pain, facial swelling, hearing loss, postnasal drip, sinus pressure, sinus pain, sneezing, sore throat, trouble swallowing and voice change.   Eyes: Negative for visual disturbance.  Respiratory: Negative for cough, chest tightness, shortness of breath, wheezing and stridor.   Cardiovascular: Negative for chest pain, palpitations and leg swelling.  Gastrointestinal: Negative for abdominal pain.  Endocrine: Negative for cold intolerance, heat intolerance, polydipsia, polyphagia and polyuria.  Neurological: Negative for dizziness and headaches.  Hematological: Does not bruise/bleed easily.  Psychiatric/Behavioral: Negative for sleep disturbance.       Objective:   Physical Exam  Constitutional: She is oriented to person, place, and time. She appears well-developed and well-nourished. No distress.  HENT:  Head: Normocephalic and atraumatic.  Right Ear: No decreased hearing is noted.  Left Ear: No decreased hearing is noted.  Eyes: Conjunctivae are normal. Pupils are equal, round, and reactive to light.  Cardiovascular: Normal rate, regular rhythm, normal heart sounds and intact distal pulses.  No murmur heard. Pulmonary/Chest: Effort normal and breath sounds normal. No respiratory distress. She has no wheezes. She has no rales. She exhibits no tenderness.  Neurological: She is alert and oriented to person, place, and time. Coordination normal.  Skin: Skin is warm and dry. No rash noted. She is not diaphoretic. No erythema. No pallor.  Psychiatric: She has a normal mood and affect. Her behavior is normal. Judgment and thought content normal.  Nursing note and vitals reviewed.         Assessment & Plan:   1. Hypothyroidism, unspecified type   2. Obesity (BMI 35.0-39.9 without comorbidity)   3. Healthcare maintenance     Healthcare maintenance Please schedule complete physical in 2 months We call when lab results are available.   Hypothyroidism TSH drawn  today  Obesity (BMI 35.0-39.9 without comorbidity) Wt today: 196, wt at last OV 203 7 lbs wt loss since last OV She drinks 4-5 16 oz bottle of water Eats a diet high in vegetables and lean protein She has dramatically reduced CHO/sugar intake She walks > 60 mins/day She denies CP/dyspnea/palpitations/insomnia [place] Cont[place]- no contraindications noted. Has been on rx for four months. 2 more months of phentermine provided- for total of 6 months of therapy Will re-evaluate need to continue at CPE f/u      FOLLOW-UP:  Return in about 2 months (around 11/25/2017) for CPE.

## 2017-09-27 ENCOUNTER — Ambulatory Visit (INDEPENDENT_AMBULATORY_CARE_PROVIDER_SITE_OTHER): Payer: BC Managed Care – PPO | Admitting: Adult Health

## 2017-09-27 ENCOUNTER — Encounter: Payer: Self-pay | Admitting: Adult Health

## 2017-09-27 VITALS — BP 100/66 | HR 72 | Ht 66.0 in | Wt 196.0 lb

## 2017-09-27 DIAGNOSIS — Z0000 Encounter for general adult medical examination without abnormal findings: Secondary | ICD-10-CM

## 2017-09-27 DIAGNOSIS — E039 Hypothyroidism, unspecified: Secondary | ICD-10-CM | POA: Diagnosis not present

## 2017-09-27 DIAGNOSIS — E669 Obesity, unspecified: Secondary | ICD-10-CM | POA: Diagnosis not present

## 2017-09-27 DIAGNOSIS — Z1211 Encounter for screening for malignant neoplasm of colon: Secondary | ICD-10-CM | POA: Insufficient documentation

## 2017-09-27 MED ORDER — PHENTERMINE HCL 37.5 MG PO TABS: 37.5000 mg | ORAL_TABLET | Freq: Every day | ORAL | 0 refills | Status: DC

## 2017-09-27 NOTE — Assessment & Plan Note (Signed)
TSH drawn today

## 2017-09-27 NOTE — Patient Instructions (Signed)
Heart-Healthy Eating Plan Many factors influence your heart health, including eating and exercise habits. Heart (coronary) risk increases with abnormal blood fat (lipid) levels. Heart-healthy meal planning includes limiting unhealthy fats, increasing healthy fats, and making other small dietary changes. This includes maintaining a healthy body weight to help keep lipid levels within a normal range. What is my plan? Your health care provider recommends that you:  Get no more than ___25__% of the total calories in your daily diet from fat.  Limit your intake of saturated fat to less than __5____% of your total calories each day.  Limit the amount of cholesterol in your diet to less than ___300__ mg per day.  What types of fat should I choose?  Choose healthy fats more often. Choose monounsaturated and polyunsaturated fats, such as olive oil and canola oil, flaxseeds, walnuts, almonds, and seeds.  Eat more omega-3 fats. Good choices include salmon, mackerel, sardines, tuna, flaxseed oil, and ground flaxseeds. Aim to eat fish at least two times each week.  Limit saturated fats. Saturated fats are primarily found in animal products, such as meats, butter, and cream. Plant sources of saturated fats include palm oil, palm kernel oil, and coconut oil.  Avoid foods with partially hydrogenated oils in them. These contain trans fats. Examples of foods that contain trans fats are stick margarine, some tub margarines, cookies, crackers, and other baked goods. What general guidelines do I need to follow?  Check food labels carefully to identify foods with trans fats or high amounts of saturated fat.  Fill one half of your plate with vegetables and green salads. Eat 4-5 servings of vegetables per day. A serving of vegetables equals 1 cup of raw leafy vegetables,  cup of raw or cooked cut-up vegetables, or  cup of vegetable juice.  Fill one fourth of your plate with whole grains. Look for the word  "whole" as the first word in the ingredient list.  Fill one fourth of your plate with lean protein foods.  Eat 4-5 servings of fruit per day. A serving of fruit equals one medium whole fruit,  cup of dried fruit,  cup of fresh, frozen, or canned fruit, or  cup of 100% fruit juice.  Eat more foods that contain soluble fiber. Examples of foods that contain this type of fiber are apples, broccoli, carrots, beans, peas, and barley. Aim to get 20-30 g of fiber per day.  Eat more home-cooked food and less restaurant, buffet, and fast food.  Limit or avoid alcohol.  Limit foods that are high in starch and sugar.  Avoid fried foods.  Cook foods by using methods other than frying. Baking, boiling, grilling, and broiling are all great options. Other fat-reducing suggestions include: ? Removing the skin from poultry. ? Removing all visible fats from meats. ? Skimming the fat off of stews, soups, and gravies before serving them. ? Steaming vegetables in water or broth.  Lose weight if you are overweight. Losing just 5-10% of your initial body weight can help your overall health and prevent diseases such as diabetes and heart disease.  Increase your consumption of nuts, legumes, and seeds to 4-5 servings per week. One serving of dried beans or legumes equals  cup after being cooked, one serving of nuts equals 1 ounces, and one serving of seeds equals  ounce or 1 tablespoon.  You may need to monitor your salt (sodium) intake, especially if you have high blood pressure. Talk with your health care provider or dietitian to get  more information about reducing sodium. What foods can I eat? Grains  Breads, including French, white, pita, wheat, raisin, rye, oatmeal, and Italian. Tortillas that are neither fried nor made with lard or trans fat. Low-fat rolls, including hotdog and hamburger buns and English muffins. Biscuits. Muffins. Waffles. Pancakes. Light popcorn. Whole-grain cereals. Flatbread.  Melba toast. Pretzels. Breadsticks. Rusks. Low-fat snacks and crackers, including oyster, saltine, matzo, graham, animal, and rye. Rice and pasta, including brown rice and those that are made with whole wheat. Vegetables All vegetables. Fruits All fruits, but limit coconut. Meats and Other Protein Sources Lean, well-trimmed beef, veal, pork, and lamb. Chicken and turkey without skin. All fish and shellfish. Wild duck, rabbit, pheasant, and venison. Egg whites or low-cholesterol egg substitutes. Dried beans, peas, lentils, and tofu.Seeds and most nuts. Dairy Low-fat or nonfat cheeses, including ricotta, string, and mozzarella. Skim or 1% milk that is liquid, powdered, or evaporated. Buttermilk that is made with low-fat milk. Nonfat or low-fat yogurt. Beverages Mineral water. Diet carbonated beverages. Sweets and Desserts Sherbets and fruit ices. Honey, jam, marmalade, jelly, and syrups. Meringues and gelatins. Pure sugar candy, such as hard candy, jelly beans, gumdrops, mints, marshmallows, and small amounts of dark chocolate. Angel food cake. Eat all sweets and desserts in moderation. Fats and Oils Nonhydrogenated (trans-free) margarines. Vegetable oils, including soybean, sesame, sunflower, olive, peanut, safflower, corn, canola, and cottonseed. Salad dressings or mayonnaise that are made with a vegetable oil. Limit added fats and oils that you use for cooking, baking, salads, and as spreads. Other Cocoa powder. Coffee and tea. All seasonings and condiments. The items listed above may not be a complete list of recommended foods or beverages. Contact your dietitian for more options. What foods are not recommended? Grains Breads that are made with saturated or trans fats, oils, or whole milk. Croissants. Butter rolls. Cheese breads. Sweet rolls. Donuts. Buttered popcorn. Chow mein noodles. High-fat crackers, such as cheese or butter crackers. Meats and Other Protein Sources Fatty meats, such  as hotdogs, short ribs, sausage, spareribs, bacon, ribeye roast or steak, and mutton. High-fat deli meats, such as salami and bologna. Caviar. Domestic duck and goose. Organ meats, such as kidney, liver, sweetbreads, brains, gizzard, chitterlings, and heart. Dairy Cream, sour cream, cream cheese, and creamed cottage cheese. Whole milk cheeses, including blue (bleu), Monterey Jack, Brie, [place], American, Havarti, Swiss, cheddar, Camembert, and [place]. Whole or 2% milk that is liquid, evaporated, or condensed. Whole buttermilk. Cream sauce or high-fat cheese sauce. Yogurt that is made from whole milk. Beverages Regular sodas and drinks with added sugar. Sweets and Desserts Frosting. Pudding. Cookies. Cakes other than angel food cake. Candy that has milk chocolate or white chocolate, hydrogenated fat, butter, coconut, or unknown ingredients. Buttered syrups. Full-fat ice cream or ice cream drinks. Fats and Oils Gravy that has suet, meat fat, or shortening. Cocoa butter, hydrogenated oils, palm oil, coconut oil, palm kernel oil. These can often be found in baked products, candy, fried foods, nondairy creamers, and whipped toppings. Solid fats and shortenings, including bacon fat, salt pork, lard, and butter. Nondairy cream substitutes, such as coffee creamers and sour cream substitutes. Salad dressings that are made of unknown oils, cheese, or sour cream. The items listed above may not be a complete list of foods and beverages to avoid. Contact your dietitian for more information. This information is not intended to replace advice given to you by your health care provider. Make sure you discuss any questions you have with your health care   provider. Document Released: 04/25/2008 Document Revised: 02/04/2016 Document Reviewed: 01/08/2014 Elsevier Interactive Patient Education  2018 [employer].    Exercising to [employer] Exercising can help you to lose weight. In order to lose weight through  exercise, you need to do vigorous-intensity exercise. You can tell that you are exercising with vigorous intensity if you are breathing very hard and fast and cannot hold a conversation while exercising. Moderate-intensity exercise helps to maintain your current weight. You can tell that you are exercising at a moderate level if you have a higher heart rate and faster breathing, but you are still able to hold a conversation. How often should I exercise? Choose an activity that you enjoy and set realistic goals. Your health care provider can help you to make an activity plan that works for you. Exercise regularly as directed by your health care provider. This may include:  Doing resistance training twice each week, such as: ? Push-ups. ? Sit-ups. ? Lifting weights. ? Using resistance bands.  Doing a given intensity of exercise for a given amount of time. Choose from these options: ? 150 minutes of moderate-intensity exercise every week. ? 75 minutes of vigorous-intensity exercise every week. ? A mix of moderate-intensity and vigorous-intensity exercise every week.  Children, pregnant women, people who are out of shape, people who are overweight, and older adults may need to consult a health care provider for individual recommendations. If you have any sort of medical condition, be sure to consult your health care provider before starting a new exercise program. What are some activities that can help me to lose weight?  Walking at a rate of at least 4.5 miles an hour.  Jogging or running at a rate of 5 miles per hour.  Biking at a rate of at least 10 miles per hour.  Lap swimming.  Roller-skating or in-line skating.  Cross-country skiing.  Vigorous competitive sports, such as football, basketball, and soccer.  Jumping rope.  Aerobic dancing. How can I be more active in my day-to-day activities?  Use the stairs instead of the elevator.  Take a walk during your lunch break.  If  you drive, park your car farther away from work or school.  If you take public transportation, get off one stop early and walk the rest of the way.  Make all of your phone calls while standing up and walking around.  Get up, stretch, and walk around every 30 minutes throughout the day. What guidelines should I follow while exercising?  Do not exercise so much that you hurt yourself, feel dizzy, or get very short of breath.  Consult your health care provider prior to starting a new exercise program.  Wear comfortable clothes and shoes with good support.  Drink plenty of water while you exercise to prevent dehydration or heat stroke. Body water is lost during exercise and must be replaced.  Work out until you breathe faster and your heart beats faster. This information is not intended to replace advice given to you by your health care provider. Make sure you discuss any questions you have with your health care provider. Document Released: 08/19/2010 Document Revised: 12/23/2015 Document Reviewed: 12/18/2013 Elsevier Interactive Patient Education  2018 [employer] Inc[employer]   YOU ARE DOING A GREAT JOB! Continue heart healthy diet, excellent water intake, and regular walking. We will call you when lab results are available. Two months of phentermine provided.  Please schedule complete physical in two months. NICE TO SEE YOU!

## 2017-09-27 NOTE — Assessment & Plan Note (Signed)
>>  ASSESSMENT AND PLAN FOR BMI 35.0-35.9,ADULT WRITTEN ON 11/27/2017 11:32 AM BY DANFORD, KATY D, NP  Wt today: 196, wt at last OV 203 7 lbs wt loss since last OV She drinks 4-5 16 oz bottle of water Eats a diet high in vegetables and lean protein She has dramatically reduced CHO/sugar intake She walks > 60 mins/day She denies CP/dyspnea/palpitations/insomnia [place] Controlled Substance Database reviewed- no contraindications noted. Has been on rx for four months. 2 more months of phentermine provided- for total of 6 months of therapy Will re-evaluate need to continue at [facility] f/u

## 2017-09-27 NOTE — Assessment & Plan Note (Addendum)
Please schedule complete physical in 2 months We call when lab results are available.

## 2017-09-27 NOTE — Assessment & Plan Note (Addendum)
Wt today: 196, wt at last OV 203 7 lbs wt loss since last OV She drinks 4-5 16 oz bottle of water Eats a diet high in vegetables and lean protein She has dramatically reduced CHO/sugar intake She walks > 60 mins/day She denies CP/dyspnea/palpitations/insomnia [place]- no contraindications noted. Has been on rx for four months. 2 more months of phentermine provided- for total of 6 months of therapy Will re-evaluate need to continue at [facility] f/u

## 2017-09-28 LAB — CBC WITH DIFFERENTIAL/PLATELET
BASOS: 0 %
Basophils Absolute: 0 10*3/uL (ref 0.0–0.2)
EOS (ABSOLUTE): 0.1 10*3/uL (ref 0.0–0.4)
Eos: 2 %
HEMATOCRIT: 40.7 % (ref 34.0–46.6)
Hemoglobin: 12.9 g/dL (ref 11.1–15.9)
IMMATURE GRANS (ABS): 0 10*3/uL (ref 0.0–0.1)
Immature Granulocytes: 0 %
Lymphocytes Absolute: 1.6 10*3/uL (ref 0.7–3.1)
Lymphs: 32 %
MCH: 25 pg — AB (ref 26.6–33.0)
MCHC: 31.7 g/dL (ref 31.5–35.7)
MCV: 79 fL (ref 79–97)
MONOS ABS: 0.4 10*3/uL (ref 0.1–0.9)
Monocytes: 9 %
Neutrophils Absolute: 2.7 10*3/uL (ref 1.4–7.0)
Neutrophils: 57 %
PLATELETS: 312 10*3/uL (ref 150–379)
RBC: 5.17 x10E6/uL (ref 3.77–5.28)
RDW: 16.2 % — AB (ref 12.3–15.4)
WBC: 4.8 10*3/uL (ref 3.4–10.8)

## 2017-09-28 LAB — COMPREHENSIVE METABOLIC PANEL
A/G RATIO: 1.8 (ref 1.2–2.2)
ALT: 21 IU/L (ref 0–32)
AST: 20 IU/L (ref 0–40)
Albumin: 4.2 g/dL (ref 3.5–5.5)
Alkaline Phosphatase: 117 IU/L (ref 39–117)
BILIRUBIN TOTAL: 0.4 mg/dL (ref 0.0–1.2)
BUN/Creatinine Ratio: 17 (ref 9–23)
BUN: 17 mg/dL (ref 6–24)
CALCIUM: 9.1 mg/dL (ref 8.7–10.2)
CHLORIDE: 105 mmol/L (ref 96–106)
CO2: 20 mmol/L (ref 20–29)
Creatinine, Ser: 1.03 mg/dL — ABNORMAL HIGH (ref 0.57–1.00)
GFR, EST AFRICAN AMERICAN: 74 mL/min/{1.73_m2} (ref >59)
GFR, EST NON AFRICAN AMERICAN: 64 mL/min/{1.73_m2} (ref >59)
GLOBULIN, TOTAL: 2.4 g/dL (ref 1.5–4.5)
Glucose: 91 mg/dL (ref 65–99)
POTASSIUM: 4.2 mmol/L (ref 3.5–5.2)
SODIUM: 140 mmol/L (ref 134–144)
TOTAL PROTEIN: 6.6 g/dL (ref 6.0–8.5)

## 2017-09-28 LAB — LIPID PANEL
CHOL/HDL RATIO: 2.6 ratio (ref 0.0–4.4)
Cholesterol, Total: 166 mg/dL (ref 100–199)
HDL: 63 mg/dL (ref >39)
LDL CALC: 93 mg/dL (ref 0–99)
Triglycerides: 50 mg/dL (ref 0–149)
VLDL CHOLESTEROL CAL: 10 mg/dL (ref 5–40)

## 2017-09-28 LAB — HEMOGLOBIN A1C
Est. average glucose Bld gHb Est-mCnc: 105 mg/dL
Hgb A1c MFr Bld: 5.3 % (ref 4.8–5.6)

## 2017-09-28 LAB — TSH: TSH: 0.142 u[IU]/mL — AB (ref 0.450–4.500)

## 2017-10-01 ENCOUNTER — Other Ambulatory Visit: Payer: Self-pay | Admitting: Adult Health

## 2017-10-01 DIAGNOSIS — E039 Hypothyroidism, unspecified: Secondary | ICD-10-CM

## 2017-10-01 MED ORDER — LEVOTHYROXINE SODIUM 112 MCG PO TABS: 112.0000 ug | ORAL_TABLET | Freq: Every day | ORAL | 0 refills | Status: DC

## 2017-10-08 ENCOUNTER — Other Ambulatory Visit: Payer: Self-pay | Admitting: Adult Health

## 2017-10-19 ENCOUNTER — Other Ambulatory Visit: Payer: BC Managed Care – PPO

## 2017-10-22 ENCOUNTER — Other Ambulatory Visit: Payer: BC Managed Care – PPO

## 2017-10-23 ENCOUNTER — Other Ambulatory Visit (INDEPENDENT_AMBULATORY_CARE_PROVIDER_SITE_OTHER): Payer: BC Managed Care – PPO

## 2017-10-23 DIAGNOSIS — E039 Hypothyroidism, unspecified: Secondary | ICD-10-CM

## 2017-10-24 ENCOUNTER — Other Ambulatory Visit: Payer: Self-pay | Admitting: Adult Health

## 2017-10-24 DIAGNOSIS — E039 Hypothyroidism, unspecified: Secondary | ICD-10-CM

## 2017-10-24 LAB — TSH: TSH: 0.102 u[IU]/mL — ABNORMAL LOW (ref 0.450–4.500)

## 2017-10-24 MED ORDER — LEVOTHYROXINE SODIUM 100 MCG PO TABS: 100.0000 ug | ORAL_TABLET | Freq: Every day | ORAL | 0 refills | Status: DC

## 2017-10-24 NOTE — Progress Notes (Signed)
Pt was called and informed of the change in medication dosage as well as the recheck. Pt expressed that she understood what was stated to her. W.Laasia Arcos, CMA

## 2017-10-30 ENCOUNTER — Other Ambulatory Visit: Payer: Self-pay | Admitting: Adult Health

## 2017-10-30 NOTE — Telephone Encounter (Signed)
Patient was last seen in the office on 09/27/17 and has follow up on 12/20/17.  Medication was last filled on 10/25/17.  Please review. MPulliam, CMA/RT(R)

## 2017-11-27 ENCOUNTER — Other Ambulatory Visit: Payer: Self-pay | Admitting: Adult Health

## 2017-11-27 NOTE — Telephone Encounter (Signed)
Please review and refill if appropriate.  T. Markavious Micco, CMA  

## 2017-11-27 NOTE — Telephone Encounter (Signed)
Pt informed.  Pt expressed understanding and is agreeable.  T. Katalyn Matin, CMA  

## 2017-11-27 NOTE — Telephone Encounter (Signed)
Good Morning [name], Can you please call Karen Gross and share- She has been on Phentermine for total of 6 months and she needs "medication holiday". We will evaluate need to continue rx at her CPE next month. Therefore rx will not be refilled, we discussed this at her last OV. Thanks! Orpha Bur

## 2017-12-03 ENCOUNTER — Inpatient Hospital Stay (HOSPITAL_COMMUNITY)
Admission: AD | Admit: 2017-12-03 | Discharge: 2017-12-03 | Disposition: A | Discharge status: Home / Self Care | Payer: BC Managed Care – PPO | Source: Ambulatory Visit | Attending: Obstetrics and Gynecology | Admitting: Obstetrics and Gynecology

## 2017-12-03 ENCOUNTER — Encounter (HOSPITAL_COMMUNITY): Payer: Self-pay | Admitting: *Deleted

## 2017-12-03 ENCOUNTER — Inpatient Hospital Stay (HOSPITAL_COMMUNITY): Payer: BC Managed Care – PPO

## 2017-12-03 DIAGNOSIS — Z90710 Acquired absence of both cervix and uterus: Secondary | ICD-10-CM | POA: Insufficient documentation

## 2017-12-03 DIAGNOSIS — R110 Nausea: Secondary | ICD-10-CM | POA: Diagnosis not present

## 2017-12-03 DIAGNOSIS — N764 Abscess of vulva: Secondary | ICD-10-CM

## 2017-12-03 DIAGNOSIS — R1030 Lower abdominal pain, unspecified: Secondary | ICD-10-CM

## 2017-12-03 DIAGNOSIS — E039 Hypothyroidism, unspecified: Secondary | ICD-10-CM | POA: Insufficient documentation

## 2017-12-03 DIAGNOSIS — Z79899 Other long term (current) drug therapy: Secondary | ICD-10-CM | POA: Diagnosis not present

## 2017-12-03 DIAGNOSIS — N281 Cyst of kidney, acquired: Secondary | ICD-10-CM | POA: Insufficient documentation

## 2017-12-03 DIAGNOSIS — N760 Acute vaginitis: Secondary | ICD-10-CM | POA: Diagnosis not present

## 2017-12-03 DIAGNOSIS — N200 Calculus of kidney: Secondary | ICD-10-CM | POA: Insufficient documentation

## 2017-12-03 DIAGNOSIS — Z79890 Hormone replacement therapy (postmenopausal): Secondary | ICD-10-CM | POA: Diagnosis not present

## 2017-12-03 HISTORY — DX: Anxiety disorder, unspecified: F41.9

## 2017-12-03 HISTORY — DX: Unspecified convulsions: R56.9

## 2017-12-03 LAB — CBC WITH DIFFERENTIAL/PLATELET
Basophils Absolute: 0 10*3/uL (ref 0.0–0.1)
Basophils Relative: 0 %
EOS ABS: 0 10*3/uL (ref 0.0–0.7)
Eosinophils Relative: 0 %
HCT: 36.4 % (ref 36.0–46.0)
Hemoglobin: 11.6 g/dL — ABNORMAL LOW (ref 12.0–15.0)
LYMPHS ABS: 1.4 10*3/uL (ref 0.7–4.0)
LYMPHS PCT: 9 %
MCH: 24.7 pg — AB (ref 26.0–34.0)
MCHC: 31.9 g/dL (ref 30.0–36.0)
MCV: 77.6 fL — ABNORMAL LOW (ref 78.0–100.0)
MONO ABS: 0.6 10*3/uL (ref 0.1–1.0)
Monocytes Relative: 4 %
NEUTROS ABS: 13 10*3/uL — AB (ref 1.7–7.7)
Neutrophils Relative %: 87 %
OTHER: 0 %
PLATELETS: 281 10*3/uL (ref 150–400)
RBC: 4.69 MIL/uL (ref 3.87–5.11)
RDW: 15.1 % (ref 11.5–15.5)
WBC: 15 10*3/uL — ABNORMAL HIGH (ref 4.0–10.5)

## 2017-12-03 LAB — URINALYSIS, ROUTINE W REFLEX MICROSCOPIC
Bilirubin Urine: NEGATIVE
GLUCOSE, UA: NEGATIVE mg/dL
Hgb urine dipstick: NEGATIVE
Ketones, ur: NEGATIVE mg/dL
NITRITE: NEGATIVE
PH: 5 (ref 5.0–8.0)
Protein, ur: NEGATIVE mg/dL
SPECIFIC GRAVITY, URINE: 1.026 (ref 1.005–1.030)

## 2017-12-03 MED ORDER — IOPAMIDOL (ISOVUE-300) INJECTION 61%
100.0000 mL | Freq: Once | INTRAVENOUS | Status: AC | PRN
Start: 2017-12-03 — End: 2017-12-03
  Administered 2017-12-03: 100 mL via INTRAVENOUS

## 2017-12-03 MED ORDER — AMOXICILLIN-POT CLAVULANATE 875-125 MG PO TABS: 1.0000 | ORAL_TABLET | Freq: Two times a day (BID) | ORAL | 0 refills | Status: DC

## 2017-12-03 MED ORDER — IOPAMIDOL (ISOVUE-300) INJECTION 61%
30.0000 mL | INTRAVENOUS | Status: AC
  Administered 2017-12-03: 30 mL via ORAL

## 2017-12-03 NOTE — Discharge Instructions (Signed)
Abdominal Pain, Adult Abdominal pain can be caused by many things. Often, abdominal pain is not serious and it gets better with no treatment or by being treated at home. However, sometimes abdominal pain is serious. Your health care provider will do a medical history and a physical exam to try to determine the cause of your abdominal pain. Follow these instructions at home:  Take over-the-counter and prescription medicines only as told by your health care provider. Do not take a laxative unless told by your health care provider.  Drink enough fluid to keep your urine clear or pale yellow.  Watch your condition for any changes.  Keep all follow-up visits as told by your health care provider. This is important. Contact a health care provider if:  Your abdominal pain changes or gets worse.  You are not hungry or you lose weight without trying.  You are constipated or have diarrhea for more than 2-3 days.  You have pain when you urinate or have a bowel movement.  Your abdominal pain wakes you up at night.  Your pain gets worse with meals, after eating, or with certain foods.  You are throwing up and cannot keep anything down.  You have a fever. Get help right away if:  Your pain does not go away as soon as your health care provider told you to expect.  You cannot stop throwing up.  Your pain is only in areas of the abdomen, such as the right side or the left lower portion of the abdomen.  You have bloody or black stools, or stools that look like tar.  You have severe pain, cramping, or bloating in your abdomen.  You have signs of dehydration, such as: ? Dark urine, very little urine, or no urine. ? Cracked lips. ? Dry mouth. ? Sunken eyes. ? Sleepiness. ? Weakness. This information is not intended to replace advice given to you by your health care provider. Make sure you discuss any questions you have with your health care provider. Document Released: 04/26/2005  Document Revised: 02/04/2016 Document Reviewed: 12/29/2015 Elsevier Interactive Patient Education  2018 Else[employer] Inc[employer] Bartholin Cyst or Abscess A Bartholin cyst is a fluid-filled sac that forms on a Bartholin gland. Bartholin glands are small glands that are found in the folds of skin (labia) on the sides of the lower opening of the vagina. This type of cyst causes a bulge on the side of the vagina. A cyst that is not large or infected may not cause problems. However, if the fluid in the cyst becomes infected, the cyst can turn into an abscess. An abscess may cause discomfort or pain. Follow these instructions at home:  Take medicines only as told by your doctor.  If you were prescribed an antibiotic medicine, finish all of it even if you start to feel better.  Apply warm, wet compresses to the area or take warm, shallow baths that cover your pelvic area (sitz baths). Do this many times each day or as told by your doctor.  Do not squeeze the cyst. Do not apply heavy pressure to it.  Do not have sex until the cyst has gone away.  If your cyst or abscess was opened by your doctor, a small piece of gauze or a drain may have been placed in the area. That lets the cyst drain. Do not remove the gauze or the drain until your doctor tells you it is okay to do that.  Do not wear tampons. Wear feminine pads  as needed for any fluid or blood.  Keep all follow-up visits as told by your doctor. This is important. Contact a doctor if:  Your pain, puffiness (swelling), or redness in the area of the cyst gets worse.  You have fluid or pus pus coming from the cyst.  You have a fever. This information is not intended to replace advice given to you by your health care provider. Make sure you discuss any questions you have with your health care provider. Document Released: 10/13/2008 Document Revised: 12/23/2015 Document Reviewed: 03/02/2014 Elsevier Interactive Patient Education  2018 [employer].

## 2017-12-03 NOTE — MAU Note (Signed)
Pt reports she started having vaginal swelling yesterday.. This morning she stared having N/V  Feeling dizzy and having a headache. Her Friend reports she is having periods of confusion as well,

## 2017-12-03 NOTE — MAU Provider Note (Signed)
History     CSN: [id number]  Arrival date and time: 12/03/17 1144   None     Chief Complaint  Patient presents with  . Vaginitis   HPI KINSLY HILD is 52 y.o. presents for evaluation of sxs of left labial irritation, pressure. redness and swelling that began yesterday.  Neg for ulceration or draining. Denies injury.  Had appt with Dr. Mindi Slicker at 3 but she because this am at work, she began with nausea, headache, dizzy at work.  Her partner, who also works with her states she became "dazed".  Went home at 10am at which time she began with lower abdominal pain. Called office back and instructed to come here. She has not taken anything for pain.   Hx of hysterectomy for fibroids.  Past Medical History:  Diagnosis Date  . Hypothyroidism   . Seizures (HCC)    Stress Induced    Past Surgical History:  Procedure Laterality Date  . ABDOMINAL HYSTERECTOMY    . KNEE ARTHROSCOPY Right     Family History  Problem Relation Age of Onset  . Breast cancer Mother   . Heart disease Mother   . Breast cancer Paternal Grandmother     Social History   Tobacco Use  . Smoking status: Never Smoker  . Smokeless tobacco: Never Used  Substance Use Topics  . Alcohol use: Yes    Comment: once a month- wine  . Drug use: No    Allergies: No Known Allergies  Medications Prior to Admission  Medication Sig Dispense Refill Last Dose  . ARIPiprazole (ABILIFY) 10 MG tablet Take 10 mg by mouth daily.   12/03/2017 at Unknown time  . levothyroxine (SYNTHROID, LEVOTHROID) 100 MCG tablet Take 1 tablet (100 mcg total) by mouth daily before breakfast. 60 tablet 0 12/03/2017 at Unknown time  . omeprazole (PRILOSEC) 20 MG capsule Take 20 mg by mouth daily.   12/03/2017 at Unknown time  . phentermine (ADIPEX-P) 37.5 MG tablet Take 37.5 mg by mouth daily before breakfast.   Past Week at Unknown time  . topiramate (TOPAMAX) 100 MG tablet Take 1 tablet (100 mg total) by mouth 2 (two) times daily. 90 tablet 2  12/03/2017 at Unknown time    Review of Systems  Constitutional: Negative for chills.       Temp on admission 99.2  Respiratory: Negative for shortness of breath.   Cardiovascular: Negative for chest pain.  Gastrointestinal: Positive for abdominal pain and nausea. Negative for vomiting.  Genitourinary: Positive for vaginal pain (left labial redness, swelling and discomfort). Negative for dysuria, frequency, genital sores, pelvic pain, urgency, vaginal bleeding and vaginal discharge.  Neurological: Positive for dizziness and headaches.       "dazed'  Psychiatric/Behavioral: Negative for agitation and confusion.   Physical Exam   Blood pressure 102/66, pulse 92, temperature 99.2 F (37.3 C), temperature source Oral, resp. rate 18, height  (1.702 m).  Physical Exam  Nursing note and vitals reviewed. Constitutional: She is oriented to person, place, and time. She appears well-developed and well-nourished.  HENT:  Head: Normocephalic.  Cardiovascular: Normal rate and regular rhythm.  Respiratory: Effort normal.  GI: Soft. There is tenderness (lower abdominal tendernss).  Genitourinary: There is no rash, tenderness, lesion or injury on the right labia. There is tenderness (left labia is swollen and slightly red.  Exam is painful and full examination is limited.  Possbilty Barltholins enlargement.  But area is soft. ) on the left labia. There is no lesion  or injury on the left labia. Right adnexum displays no tenderness. Left adnexum displays no tenderness. No erythema, tenderness or bleeding in the vagina. No foreign body in the vagina. No signs of injury around the vagina. No vaginal discharge found.  Genitourinary Comments: Cervix and uterus surgically absent.  Neurological: She is alert and oriented to person, place, and time.  Skin: Skin is warm and dry.  Psychiatric: Thought content normal.  Quiet, but answers questions appropriately.    Results for orders placed or performed  during the hospital encounter of 12/03/17 (from the past 24 hour(s))  Urinalysis, Routine w reflex microscopic     Status: Abnormal   Collection Time: 12/03/17 12:30 PM  Result Value Ref Range   Color, Urine YELLOW YELLOW   APPearance CLEAR CLEAR   Specific Gravity, Urine 1.026 1.005 - 1.030   pH 5.0 5.0 - 8.0   Glucose, UA NEGATIVE NEGATIVE mg/dL   Hgb urine dipstick NEGATIVE NEGATIVE   Bilirubin Urine NEGATIVE NEGATIVE   Ketones, ur NEGATIVE NEGATIVE mg/dL   Protein, ur NEGATIVE NEGATIVE mg/dL   Nitrite NEGATIVE NEGATIVE   Leukocytes, UA TRACE (A) NEGATIVE   RBC / HPF 0-5 0 - 5 RBC/hpf   WBC, UA 11-20 0 - 5 WBC/hpf   Bacteria, UA RARE (A) NONE SEEN   Squamous Epithelial / LPF 0-5 0 - 5   Mucus PRESENT    Hyaline Casts, UA PRESENT    Ca Oxalate Crys, UA PRESENT   CBC with Differential/Platelet     Status: Abnormal   Collection Time: 12/03/17  1:37 PM  Result Value Ref Range   WBC 15.0 (H) 4.0 - 10.5 K/uL   RBC 4.69 3.87 - 5.11 MIL/uL   Hemoglobin 11.6 (L) 12.0 - 15.0 g/dL   HCT 62.1 30.8 - 65.7 %   MCV 77.6 (L) 78.0 - 100.0 fL   MCH 24.7 (L) 26.0 - 34.0 pg   MCHC 31.9 30.0 - 36.0 g/dL   RDW 84.6 96.2 - 95.2 %   Platelets 281 150 - 400 K/uL   Neutrophils Relative % 87 %   Lymphocytes Relative 9 %   Monocytes Relative 4 %   Eosinophils Relative 0 %   Basophils Relative 0 %   Other 0 %   Neutro Abs 13.0 (H) 1.7 - 7.7 K/uL   Lymphs Abs 1.4 0.7 - 4.0 K/uL   Monocytes Absolute 0.6 0.1 - 1.0 K/uL   Eosinophils Absolute 0.0 0.0 - 0.7 K/uL   Basophils Absolute 0.0 0.0 - 0.1 K/uL   Smear Review MORPHOLOGY UNREMARKABLE      Today's Vitals   12/03/17 1222 12/03/17 1223 12/03/17 1619  BP:  102/66   Pulse:  92   Resp:  18   Temp:  99.2 F (37.3 C) 98.1 F (36.7 C)  TempSrc:  Oral Oral  Height:   (1.702 m)   PainSc: 7     Ct Abdomen Pelvis W Contrast  Result Date: 12/03/2017 CLINICAL DATA:  Vaginal swelling.  Nausea vomiting and dizziness. EXAM: CT ABDOMEN AND  PELVIS WITH CONTRAST TECHNIQUE: Multidetector CT imaging of the abdomen and pelvis was performed using the standard protocol following bolus administration of intravenous contrast. CONTRAST:  ISOVUE-300 IOPAMIDOL (ISOVUE-300) INJECTION 61% COMPARISON:  None. FINDINGS: Lower chest: No acute abnormality. Hepatobiliary: No focal liver abnormality is seen. No gallstones, gallbladder wall thickening, or biliary dilatation. Pancreas: Unremarkable. No pancreatic ductal dilatation or surrounding inflammatory changes. Spleen: 2 subcentimeter hypoattenuated lesion in the spleen,  both subcapsular. Adrenals/Urinary Tract: Bilateral normal adrenal glands. Benign-appearing 12 mm right renal cyst in the superior pole of the right kidney. 1-2 mm nonobstructive left lower pole renal calculus. Normal appearance of the ureters and urinary bladder. Stomach/Bowel: Stomach is within normal limits. Appendix appears normal. No evidence of bowel wall thickening, distention, or inflammatory changes. Vascular/Lymphatic: No significant vascular findings are present. No enlarged abdominal or pelvic lymph nodes. Reproductive: Status post hysterectomy. No adnexal masses. 1.8 cm complex cystic structure on the left vulva. Other: No abdominal wall hernia or abnormality. No abdominopelvic ascites. Musculoskeletal: No acute or significant osseous findings. IMPRESSION: Two subcentimeter subcapsular hypoattenuated lesions within the spleen with uncertain clinical significance. In the absence of malignancy capable of producing metastatic lesions, these likely represent benign lesions, such as complicated cysts or abscesses. One 2 mm nonobstructive left lower pole renal calculus. 1.8 cm complex cystic structure on the left vulva may explain patient's symptoms and likely represents a complicated cyst versus a small abscess. Correlation with clinical exam is recommended. Electronically Signed   By: Ted Mcalpine M.D.   On: 12/03/2017 16:22    MAU Course  Procedures  MDM MSE EXAM Labs14:00 HPI,  MSE, VSS, lab results reported to Dr. Jackelyn Knife.  Order given for CT of Abd and pelvis CT with Contrastof Abd/pelvis Saline Lock for CT  Discussed CT results with Dr. Jackelyn Knife.  Order given to d/c home with Augmentin  bid X 1 week and call office for f/u appt with Dr. Mindi Slicker at the end of this week.  Warm soaks 2 X day  Assessment and Plan  A: Left labial cyst vs abscess      Lower abdominal pain]      Nausea      P: Patient instructed on antibiotic use, warm soaks 2 X day, call for f/u with Dr. Mindi Slicker for the end of this week.  Call office for worsening sxs  Dennison Mascot Oluwasemilore Pascuzzi 12/03/2017, 2:06 PM

## 2017-12-19 NOTE — Progress Notes (Signed)
Subjective:    Patient ID: Karen Gross, female    DOB: 18-Apr-1966, 52 y.o.   MRN: [id number]  HPI:  Karen Gross presents for CPE She has lost another 2 lbs since her last OV in Feb 2019 She has been off Phentermine for one month- medication vacation. She continues to walk daily, however denies any other formal exercise  She has slacked odd water intake, estimates only 60 oz day She has been trying to watch saturated fat/sugar/CHO intake She denies acute complaints today  Healthcare Maintenance: PAP- Hx of hysterectomy Mammogram- UTD, records requested Colonoscopy- due next year Immunizations- UTD  Patient Care Team    Relationship Specialty Notifications Start End  William Hamburger D, NP PCP - General Family Medicine  12/27/16   Nita Sells, MD  Dermatology  12/27/16     Patient Active Problem List   Diagnosis Date Noted  . Healthcare maintenance 09/27/2017  . Excessive cerumen in ear canal, bilateral 04/18/2017  . GERD (gastroesophageal reflux disease) 12/27/2016  . Obesity (BMI 35.0-39.9 without comorbidity) 12/27/2016  . Hypothyroidism 10/23/2015  . Migraine 10/23/2015     Past Medical History:  Diagnosis Date  . Anxiety   . Bartholin cyst   . Hypothyroidism   . Seizures (HCC)    Stress Induced     Past Surgical History:  Procedure Laterality Date  . ABDOMINAL HYSTERECTOMY    . KNEE ARTHROSCOPY Right      Family History  Problem Relation Age of Onset  . Breast cancer Mother   . Heart disease Mother   . Breast cancer Paternal Grandmother      Social History   Substance and Sexual Activity  Drug Use No     Social History   Substance and Sexual Activity  Alcohol Use Yes   Comment: once a month- wine     Social History   Tobacco Use  Smoking Status Never Smoker  Smokeless Tobacco Never Used     Outpatient Encounter Medications as of 12/20/2017  Medication Sig  . ARIPiprazole (ABILIFY) 10 MG tablet Take 10 mg by mouth daily.  [name]  levothyroxine (SYNTHROID, LEVOTHROID) 100 MCG tablet Take 1 tablet (100 mcg total) by mouth daily before breakfast.  . omeprazole (PRILOSEC) 20 MG capsule Take 20 mg by mouth daily.  [name] topiramate (TOPAMAX) 100 MG tablet Take 1 tablet (100 mg total) by mouth 2 (two) times daily.  . phentermine (ADIPEX-P) 37.5 MG tablet Take 37.5 mg by mouth daily before breakfast.  . [DISCONTINUED] amoxicillin-clavulanate (AUGMENTIN) 875-125 MG tablet Take 1 tablet by mouth 2 (two) times daily.   No facility-administered encounter medications on file as of 12/20/2017.     Allergies: Patient has no known allergies.  Body mass index is 31.41 kg/m.  Blood pressure 100/66, pulse 78, height  (1.676 m), weight 194 lb 9.6 oz (88.3 kg), SpO2 98 %.     Review of Systems  Constitutional: Positive for fatigue. Negative for activity change, appetite change, chills, diaphoresis, fever and unexpected weight change.  HENT: Positive for congestion and postnasal drip.   Eyes: Negative for visual disturbance.  Respiratory: Negative for cough, chest tightness, shortness of breath, wheezing and stridor.   Cardiovascular: Negative for chest pain, palpitations and leg swelling.  Gastrointestinal: Negative for abdominal distention, abdominal pain, blood in stool, constipation, diarrhea, nausea and vomiting.  Endocrine: Negative for cold intolerance, heat intolerance, polydipsia, polyphagia and polyuria.  Genitourinary: Negative for difficulty urinating, flank pain and hematuria.  Musculoskeletal: Positive for  arthralgias, gait problem, joint swelling and myalgias. Negative for back pain, neck pain and neck stiffness.  Skin: Positive for color change. Negative for pallor, rash and wound.       Slow healing hematoma RLE, r/t falling on her daughter's bed  Neurological: Negative for dizziness and headaches.  Hematological: Does not bruise/bleed easily.  Psychiatric/Behavioral: Negative for confusion, decreased  concentration, dysphoric mood, hallucinations, self-injury, sleep disturbance and suicidal ideas. The patient is not nervous/anxious and is not hyperactive.        Objective:   Physical Exam  Constitutional: She is oriented to person, place, and time. She appears well-developed and well-nourished. No distress.  HENT:  Head: Normocephalic and atraumatic.  Right Ear: External ear normal. Tympanic membrane is not erythematous and not bulging. No decreased hearing is noted.  Left Ear: External ear normal. Tympanic membrane is not erythematous and not bulging. No decreased hearing is noted.  Nose: No mucosal edema or rhinorrhea. Right sinus exhibits no maxillary sinus tenderness and no frontal sinus tenderness. Left sinus exhibits no maxillary sinus tenderness and no frontal sinus tenderness.  Mouth/Throat: Uvula is midline, oropharynx is clear and moist and mucous membranes are normal.  Eyes: Pupils are equal, round, and reactive to light. Conjunctivae and EOM are normal.  Neck: Normal range of motion. Neck supple.  Cardiovascular: Normal rate, regular rhythm, normal heart sounds and intact distal pulses.  No murmur heard. Pulmonary/Chest: Effort normal and breath sounds normal. No stridor. No respiratory distress. She has no decreased breath sounds. She has no wheezes. She has no rhonchi. She has no rales. She exhibits no mass and no tenderness. Right breast exhibits no inverted nipple, no mass, no nipple discharge, no skin change and no tenderness. Left breast exhibits no inverted nipple, no mass, no nipple discharge, no skin change and no tenderness.  Abdominal: Soft. Bowel sounds are normal. She exhibits no distension and no mass. There is no tenderness. There is no rebound and no guarding. No hernia.  Musculoskeletal: Normal range of motion. She exhibits tenderness.  Lymphadenopathy:    She has no cervical adenopathy.  Neurological: She is alert and oriented to person, place, and time.  Coordination normal.  Skin: Skin is warm and dry. Capillary refill takes less than 2 seconds. No rash noted. She is not diaphoretic. No erythema. No pallor.  Psychiatric: She has a normal mood and affect. Her behavior is normal. Judgment and thought content normal.  Nursing note and vitals reviewed.     Assessment & Plan:   1. Healthcare maintenance   2. Obesity (BMI 35.0-39.9 without comorbidity)     Healthcare maintenance Please continue all medications as directed. You have lost 2 lbs since last office visit in Feb 2019- GREAT JOB! Increase water intake, strive for at least 100 ounces/day.   Follow Heart Healthy diet Increase regular exercise.  Recommend at least 30 minutes daily, 5 days per week of walking, jogging, biking, swimming, YouTube/Pinterest workout videos. We will re-start Phentermine in 3-4 weeks, please call clinic. Final 3 months of Phentermine will be provided.  Medication is meant to assist is lifestyle change/weight loss, not a permanent regime. Follow-up in 3 months.  Obesity (BMI 35.0-39.9 without comorbidity) Body mass index is 31.41 kg/m.  Current wt 194 Increase water intake, strive for at least 100 ounces/day.   Follow Heart Healthy diet Increase regular exercise.  Recommend at least 30 minutes daily, 5 days per week of walking, jogging, biking, swimming, YouTube/Pinterest workout videos. Will re-start Phentermine in  3/4 weeks, for a final 3 month course Rx is meant to augment TLC    FOLLOW-UP:  Return in about 3 months (around 03/22/2018) for Regular Follow Up, Medical Weight Loss.

## 2017-12-20 ENCOUNTER — Encounter: Payer: Self-pay | Admitting: Adult Health

## 2017-12-20 ENCOUNTER — Ambulatory Visit (INDEPENDENT_AMBULATORY_CARE_PROVIDER_SITE_OTHER): Payer: BC Managed Care – PPO | Admitting: Adult Health

## 2017-12-20 VITALS — BP 100/66 | HR 78 | Ht 66.0 in | Wt 194.6 lb

## 2017-12-20 DIAGNOSIS — E669 Obesity, unspecified: Secondary | ICD-10-CM

## 2017-12-20 DIAGNOSIS — Z0000 Encounter for general adult medical examination without abnormal findings: Secondary | ICD-10-CM

## 2017-12-20 NOTE — Assessment & Plan Note (Signed)
Please continue all medications as directed. You have lost 2 lbs since last office visit in Feb 2019- GREAT JOB! Increase water intake, strive for at least 100 ounces/day.   Follow Heart Healthy diet Increase regular exercise.  Recommend at least 30 minutes daily, 5 days per week of walking, jogging, biking, swimming, YouTube/Pinterest workout videos. We will re-start Phentermine in 3-4 weeks, please call clinic. Final 3 months of Phentermine will be provided.  Medication is meant to assist is lifestyle change/weight loss, not a permanent regime. Follow-up in 3 months.

## 2017-12-20 NOTE — Patient Instructions (Signed)
Preventive Care for Adults, Female  A healthy lifestyle and preventive care can promote health and wellness. Preventive health guidelines for women include the following key practices.   A routine yearly physical is a good way to check with your health care provider about your health and preventive screening. It is a chance to share any concerns and updates on your health and to receive a thorough exam.   Visit your dentist for a routine exam and preventive care every 6 months. Brush your teeth twice a day and floss once a day. Good oral hygiene prevents tooth decay and gum disease.   The frequency of eye exams is based on your age, health, family medical history, use of contact lenses, and other factors. Follow your health care provider's recommendations for frequency of eye exams.   Eat a healthy diet. Foods like vegetables, fruits, whole grains, low-fat dairy products, and lean protein foods contain the nutrients you need without too many calories. Decrease your intake of foods high in solid fats, added sugars, and salt. Eat the right amount of calories for you.Get information about a proper diet from your health care provider, if necessary.   Regular physical exercise is one of the most important things you can do for your health. Most adults should get at least 150 minutes of moderate-intensity exercise (any activity that increases your heart rate and causes you to sweat) each week. In addition, most adults need muscle-strengthening exercises on 2 or more days a week.   Maintain a healthy weight. The body mass index (BMI) is a screening tool to identify possible weight problems. It provides an estimate of body fat based on height and weight. Your health care provider can find your BMI, and can help you achieve or maintain a healthy weight.For adults 20 years and older:   - A BMI below 18.5 is considered underweight.   - A BMI of 18.5 to 24.9 is normal.   - A BMI of 25 to 29.9 is  considered overweight.   - A BMI of 30 and above is considered obese.   Maintain normal blood lipids and cholesterol levels by exercising and minimizing your intake of trans and saturated fats.  Eat a balanced diet with plenty of fruit and vegetables. Blood tests for lipids and cholesterol should begin at age 20 and be repeated every 5 years minimum.  If your lipid or cholesterol levels are high, you are over 40, or you are at high risk for heart disease, you may need your cholesterol levels checked more frequently.Ongoing high lipid and cholesterol levels should be treated with medicines if diet and exercise are not working.   If you smoke, find out from your health care provider how to quit. If you do not use tobacco, do not start.   Lung cancer screening is recommended for adults aged 55-80 years who are at high risk for developing lung cancer because of a history of smoking. A yearly low-dose CT scan of the lungs is recommended for people who have at least a 30-pack-year history of smoking and are a current smoker or have quit within the past 15 years. A pack year of smoking is smoking an average of 1 pack of cigarettes a day for 1 year (for example: 1 pack a day for 30 years or 2 packs a day for 15 years). Yearly screening should continue until the smoker has stopped smoking for at least 15 years. Yearly screening should be stopped for people who develop a   health problem that would prevent them from having lung cancer treatment.   If you are pregnant, do not drink alcohol. If you are breastfeeding, be very cautious about drinking alcohol. If you are not pregnant and choose to drink alcohol, do not have more than 1 drink per day. One drink is considered to be 12 ounces (355 mL) of beer, 5 ounces (148 mL) of wine, or 1.5 ounces (44 mL) of liquor.   Avoid use of street drugs. Do not share needles with anyone. Ask for help if you need support or instructions about stopping the use of  drugs.   High blood pressure causes heart disease and increases the risk of stroke. Your blood pressure should be checked at least yearly.  Ongoing high blood pressure should be treated with medicines if weight loss and exercise do not work.   If you are 69-55 years old, ask your health care provider if you should take aspirin to prevent strokes.   Diabetes screening involves taking a blood sample to check your fasting blood sugar level. This should be done once every 3 years, after age 38, if you are within normal weight and without risk factors for diabetes. Testing should be considered at a younger age or be carried out more frequently if you are overweight and have at least 1 risk factor for diabetes.   Breast cancer screening is essential preventive care for women. You should practice "breast self-awareness."  This means understanding the normal appearance and feel of your breasts and may include breast self-examination.  Any changes detected, no matter how small, should be reported to a health care provider.  Women in their 80s and 30s should have a clinical breast exam (CBE) by a health care provider as part of a regular health exam every 1 to 3 years.  After age 66, women should have a CBE every year.  Starting at age 1, women should consider having a mammogram (breast X-ray test) every year.  Women who have a family history of breast cancer should talk to their health care provider about genetic screening.  Women at a high risk of breast cancer should talk to their health care providers about having an MRI and a mammogram every year.   -Breast cancer gene (BRCA)-related cancer risk assessment is recommended for women who have family members with BRCA-related cancers. BRCA-related cancers include breast, ovarian, tubal, and peritoneal cancers. Having family members with these cancers may be associated with an increased risk for harmful changes (mutations) in the breast cancer genes BRCA1 and  BRCA2. Results of the assessment will determine the need for genetic counseling and BRCA1 and BRCA2 testing.   The Pap test is a screening test for cervical cancer. A Pap test can show cell changes on the cervix that might become cervical cancer if left untreated. A Pap test is a procedure in which cells are obtained and examined from the lower end of the uterus (cervix).   - Women should have a Pap test starting at age 57.   - Between ages 90 and 70, Pap tests should be repeated every 2 years.   - Beginning at age 63, you should have a Pap test every 3 years as long as the past 3 Pap tests have been normal.   - Some women have medical problems that increase the chance of getting cervical cancer. Talk to your health care provider about these problems. It is especially important to talk to your health care provider if a  new problem develops soon after your last Pap test. In these cases, your health care provider may recommend more frequent screening and Pap tests.   - The above recommendations are the same for women who have or have not gotten the vaccine for human papillomavirus (HPV).   - If you had a hysterectomy for a problem that was not cancer or a condition that could lead to cancer, then you no longer need Pap tests. Even if you no longer need a Pap test, a regular exam is a good idea to make sure no other problems are starting.   - If you are between ages 36 and 66 years, and you have had normal Pap tests going back 10 years, you no longer need Pap tests. Even if you no longer need a Pap test, a regular exam is a good idea to make sure no other problems are starting.   - If you have had past treatment for cervical cancer or a condition that could lead to cancer, you need Pap tests and screening for cancer for at least 20 years after your treatment.   - If Pap tests have been discontinued, risk factors (such as a new sexual partner) need to be reassessed to determine if screening should  be resumed.   - The HPV test is an additional test that may be used for cervical cancer screening. The HPV test looks for the virus that can cause the cell changes on the cervix. The cells collected during the Pap test can be tested for HPV. The HPV test could be used to screen women aged 70 years and older, and should be used in women of any age who have unclear Pap test results. After the age of 67, women should have HPV testing at the same frequency as a Pap test.   Colorectal cancer can be detected and often prevented. Most routine colorectal cancer screening begins at the age of 57 years and continues through age 26 years. However, your health care provider may recommend screening at an earlier age if you have risk factors for colon cancer. On a yearly basis, your health care provider may provide home test kits to check for hidden blood in the stool.  Use of a small camera at the end of a tube, to directly examine the colon (sigmoidoscopy or colonoscopy), can detect the earliest forms of colorectal cancer. Talk to your health care provider about this at age 23, when routine screening begins. Direct exam of the colon should be repeated every 5 -10 years through age 49 years, unless early forms of pre-cancerous polyps or small growths are found.   People who are at an increased risk for hepatitis B should be screened for this virus. You are considered at high risk for hepatitis B if:  -You were born in a country where hepatitis B occurs often. Talk with your health care provider about which countries are considered high risk.  - Your parents were born in a high-risk country and you have not received a shot to protect against hepatitis B (hepatitis B vaccine).  - You have HIV or AIDS.  - You use needles to inject street drugs.  - You live with, or have sex with, someone who has Hepatitis B.  - You get hemodialysis treatment.  - You take certain medicines for conditions like cancer, organ  transplantation, and autoimmune conditions.   Hepatitis C blood testing is recommended for all people born from [birth year] through 1965 and any individual  with known risks for hepatitis C.   Practice safe sex. Use condoms and avoid high-risk sexual practices to reduce the spread of sexually transmitted infections (STIs). STIs include gonorrhea, chlamydia, syphilis, trichomonas, herpes, HPV, and human immunodeficiency virus (HIV). Herpes, HIV, and HPV are viral illnesses that have no cure. They can result in disability, cancer, and death. Sexually active women aged 25 years and younger should be checked for chlamydia. Older women with new or multiple partners should also be tested for chlamydia. Testing for other STIs is recommended if you are sexually active and at increased risk.   Osteoporosis is a disease in which the bones lose minerals and strength with aging. This can result in serious bone fractures or breaks. The risk of osteoporosis can be identified using a bone density scan. Women ages 65 years and over and women at risk for fractures or osteoporosis should discuss screening with their health care providers. Ask your health care provider whether you should take a calcium supplement or vitamin D to There are also several preventive steps women can take to avoid osteoporosis and resulting fractures or to keep osteoporosis from worsening. -->Recommendations include:  Eat a balanced diet high in fruits, vegetables, calcium, and vitamins.  Get enough calcium. The recommended total intake of is 1,200 mg daily; for best absorption, if taking supplements, divide doses into 250-500 mg doses throughout the day. Of the two types of calcium, calcium carbonate is best absorbed when taken with food but calcium citrate can be taken on an empty stomach.  Get enough vitamin D. NAMS and the National Osteoporosis Foundation recommend at least 1,000 IU per day for women age 50 and over who are at risk of vitamin D  deficiency. Vitamin D deficiency can be caused by inadequate sun exposure (for example, those who live in northern latitudes).  Avoid alcohol and smoking. Heavy alcohol intake (more than 7 drinks per week) increases the risk of falls and hip fracture and women smokers tend to lose bone more rapidly and have lower bone mass than nonsmokers. Stopping smoking is one of the most important changes women can make to improve their health and decrease risk for disease.  Be physically active every day. Weight-bearing exercise (for example, fast walking, hiking, jogging, and weight training) may strengthen bones or slow the rate of bone loss that comes with aging. Balancing and muscle-strengthening exercises can reduce the risk of falling and fracture.  Consider therapeutic medications. Currently, several types of effective drugs are available. Healthcare providers can recommend the type most appropriate for each woman.  Eliminate environmental factors that may contribute to accidents. Falls cause nearly 90% of all osteoporotic fractures, so reducing this risk is an important bone-health strategy. Measures include ample lighting, removing obstructions to walking, using nonskid rugs on floors, and placing mats and/or grab bars in showers.  Be aware of medication side effects. Some common medicines make bones weaker. These include a type of steroid drug called glucocorticoids used for arthritis and asthma, some antiseizure drugs, certain sleeping pills, treatments for endometriosis, and some cancer drugs. An overactive thyroid gland or using too much thyroid hormone for an underactive thyroid can also be a problem. If you are taking these medicines, talk to your doctor about what you can do to help protect your bones.reduce the rate of osteoporosis.    Menopause can be associated with physical symptoms and risks. Hormone replacement therapy is available to decrease symptoms and risks. You should talk to your  health care provider   about whether hormone replacement therapy is right for you.   Use sunscreen. Apply sunscreen liberally and repeatedly throughout the day. You should seek shade when your shadow is shorter than you. Protect yourself by wearing long sleeves, pants, a wide-brimmed hat, and sunglasses year round, whenever you are outdoors.   Once a month, do a whole body skin exam, using a mirror to look at the skin on your back. Tell your health care provider of new moles, moles that have irregular borders, moles that are larger than a pencil eraser, or moles that have changed in shape or color.   -Stay current with required vaccines (immunizations).   Influenza vaccine. All adults should be immunized every year.  Tetanus, diphtheria, and acellular pertussis (Td, Tdap) vaccine. Pregnant women should receive 1 dose of Tdap vaccine during each pregnancy. The dose should be obtained regardless of the length of time since the last dose. Immunization is preferred during the 27th 36th week of gestation. An adult who has not previously received Tdap or who does not know her vaccine status should receive 1 dose of Tdap. This initial dose should be followed by tetanus and diphtheria toxoids (Td) booster doses every 10 years. Adults with an unknown or incomplete history of completing a 3-dose immunization series with Td-containing vaccines should begin or complete a primary immunization series including a Tdap dose. Adults should receive a Td booster every 10 years.  Varicella vaccine. An adult without evidence of immunity to varicella should receive 2 doses or a second dose if she has previously received 1 dose. Pregnant females who do not have evidence of immunity should receive the first dose after pregnancy. This first dose should be obtained before leaving the health care facility. The second dose should be obtained 4 8 weeks after the first dose.  Human papillomavirus (HPV) vaccine. Females aged 13 26  years who have not received the vaccine previously should obtain the 3-dose series. The vaccine is not recommended for use in pregnant females. However, pregnancy testing is not needed before receiving a dose. If a female is found to be pregnant after receiving a dose, no treatment is needed. In that case, the remaining doses should be delayed until after the pregnancy. Immunization is recommended for any person with an immunocompromised condition through the age of 26 years if she did not get any or all doses earlier. During the 3-dose series, the second dose should be obtained 4 8 weeks after the first dose. The third dose should be obtained 24 weeks after the first dose and 16 weeks after the second dose.  Zoster vaccine. One dose is recommended for adults aged 60 years or older unless certain conditions are present.  Measles, mumps, and rubella (MMR) vaccine. Adults born before 1957 generally are considered immune to measles and mumps. Adults born in 1957 or later should have 1 or more doses of MMR vaccine unless there is a contraindication to the vaccine or there is laboratory evidence of immunity to each of the three diseases. A routine second dose of MMR vaccine should be obtained at least 28 days after the first dose for students attending postsecondary schools, health care workers, or international travelers. People who received inactivated measles vaccine or an unknown type of measles vaccine during 1963 1967 should receive 2 doses of MMR vaccine. People who received inactivated mumps vaccine or an unknown type of mumps vaccine before 1979 and are at high risk for mumps infection should consider immunization with 2 doses of   MMR vaccine. For females of childbearing age, rubella immunity should be determined. If there is no evidence of immunity, females who are not pregnant should be vaccinated. If there is no evidence of immunity, females who are pregnant should delay immunization until after pregnancy.  Unvaccinated health care workers born before [birth year] who lack laboratory evidence of measles, mumps, or rubella immunity or laboratory confirmation of disease should consider measles and mumps immunization with 2 doses of MMR vaccine or rubella immunization with 1 dose of MMR vaccine.  Pneumococcal 13-valent conjugate (PCV13) vaccine. When indicated, a person who is uncertain of her immunization history and has no record of immunization should receive the PCV13 vaccine. An adult aged 54 years or older who has certain medical conditions and has not been previously immunized should receive 1 dose of PCV13 vaccine. This PCV13 should be followed with a dose of pneumococcal polysaccharide (PPSV23) vaccine. The PPSV23 vaccine dose should be obtained at least 8 weeks after the dose of PCV13 vaccine. An adult aged 58 years or older who has certain medical conditions and previously received 1 or more doses of PPSV23 vaccine should receive 1 dose of PCV13. The PCV13 vaccine dose should be obtained 1 or more years after the last PPSV23 vaccine dose.  Pneumococcal polysaccharide (PPSV23) vaccine. When PCV13 is also indicated, PCV13 should be obtained first. All adults aged 58 years and older should be immunized. An adult younger than age 65 years who has certain medical conditions should be immunized. Any person who resides in a nursing home or long-term care facility should be immunized. An adult smoker should be immunized. People with an immunocompromised condition and certain other conditions should receive both PCV13 and PPSV23 vaccines. People with human immunodeficiency virus (HIV) infection should be immunized as soon as possible after diagnosis. Immunization during chemotherapy or radiation therapy should be avoided. Routine use of PPSV23 vaccine is not recommended for American Indians, [place] Natives, or people younger than 65 years unless there are medical conditions that require PPSV23 vaccine. When indicated,  people who have unknown immunization and have no record of immunization should receive PPSV23 vaccine. One-time revaccination 5 years after the first dose of PPSV23 is recommended for people aged 70 64 years who have chronic kidney failure, nephrotic syndrome, asplenia, or immunocompromised conditions. People who received 1 2 doses of PPSV23 before age 32 years should receive another dose of PPSV23 vaccine at age 96 years or later if at least 5 years have passed since the previous dose. Doses of PPSV23 are not needed for people immunized with PPSV23 at or after age 55 years.  Meningococcal vaccine. Adults with asplenia or persistent complement component deficiencies should receive 2 doses of quadrivalent meningococcal conjugate (MenACWY-D) vaccine. The doses should be obtained at least 2 months apart. Microbiologists working with certain meningococcal bacteria, [employer] recruits, people at risk during an outbreak, and people who travel to or live in countries with a high rate of meningitis should be immunized. A first-year college student up through age 58 years who is living in a residence hall should receive a dose if she did not receive a dose on or after her 16th birthday. Adults who have certain high-risk conditions should receive one or more doses of vaccine.  Hepatitis A vaccine. Adults who wish to be protected from this disease, have certain high-risk conditions, work with hepatitis A-infected animals, work in hepatitis A research labs, or travel to or work in countries with a high rate of hepatitis A should be  immunized. Adults who were previously unvaccinated and who anticipate close contact with an international adoptee during the first 60 days after arrival in the [place] from a country with a high rate of hepatitis A should be immunized.  Hepatitis B vaccine.  Adults who wish to be protected from this disease, have certain high-risk conditions, may be exposed to blood or other infectious  body fluids, are household contacts or sex partners of hepatitis B positive people, are clients or workers in certain care facilities, or travel to or work in countries with a high rate of hepatitis B should be immunized.  Haemophilus influenzae type b (Hib) vaccine. A previously unvaccinated person with asplenia or sickle cell disease or having a scheduled splenectomy should receive 1 dose of Hib vaccine. Regardless of previous immunization, a recipient of a hematopoietic stem cell transplant should receive a 3-dose series 6 12 months after her successful transplant. Hib vaccine is not recommended for adults with HIV infection.  Preventive Services / Frequency Ages 6 to 39years  Blood pressure check.** / Every 1 to 2 years.  Lipid and cholesterol check.** / Every 5 years beginning at age 39.  Clinical breast exam.** / Every 3 years for women in their 61s and 62s.  BRCA-related cancer risk assessment.** / For women who have family members with a BRCA-related cancer (breast, ovarian, tubal, or peritoneal cancers).  Pap test.** / Every 2 years from ages 47 through 85. Every 3 years starting at age 34 through age 12 or 74 with a history of 3 consecutive normal Pap tests.  HPV screening.** / Every 3 years from ages 46 through ages 43 to 54 with a history of 3 consecutive normal Pap tests.  Hepatitis C blood test.** / For any individual with known risks for hepatitis C.  Skin self-exam. / Monthly.  Influenza vaccine. / Every year.  Tetanus, diphtheria, and acellular pertussis (Tdap, Td) vaccine.** / Consult your health care provider. Pregnant women should receive 1 dose of Tdap vaccine during each pregnancy. 1 dose of Td every 10 years.  Varicella vaccine.** / Consult your health care provider. Pregnant females who do not have evidence of immunity should receive the first dose after pregnancy.  HPV vaccine. / 3 doses over 6 months, if 64 and younger. The vaccine is not recommended for use in  pregnant females. However, pregnancy testing is not needed before receiving a dose.  Measles, mumps, rubella (MMR) vaccine.** / You need at least 1 dose of MMR if you were born in 1957 or later. You may also need a 2nd dose. For females of childbearing age, rubella immunity should be determined. If there is no evidence of immunity, females who are not pregnant should be vaccinated. If there is no evidence of immunity, females who are pregnant should delay immunization until after pregnancy.  Pneumococcal 13-valent conjugate (PCV13) vaccine.** / Consult your health care provider.  Pneumococcal polysaccharide (PPSV23) vaccine.** / 1 to 2 doses if you smoke cigarettes or if you have certain conditions.  Meningococcal vaccine.** / 1 dose if you are age 71 to 39 years and a Market researcher living in a residence hall, or have one of several medical conditions, you need to get vaccinated against meningococcal disease. You may also need additional booster doses.  Hepatitis A vaccine.** / Consult your health care provider.  Hepatitis B vaccine.** / Consult your health care provider.  Haemophilus influenzae type b (Hib) vaccine.** / Consult your health care provider.  Ages 55 to 64years  Blood pressure check.** / Every 1 to 2 years.  Lipid and cholesterol check.** / Every 5 years beginning at age 20 years.  Lung cancer screening. / Every year if you are aged 55 80 years and have a 30-pack-year history of smoking and currently smoke or have quit within the past 15 years. Yearly screening is stopped once you have quit smoking for at least 15 years or develop a health problem that would prevent you from having lung cancer treatment.  Clinical breast exam.** / Every year after age 40 years.  BRCA-related cancer risk assessment.** / For women who have family members with a BRCA-related cancer (breast, ovarian, tubal, or peritoneal cancers).  Mammogram.** / Every year beginning at age 40  years and continuing for as long as you are in good health. Consult with your health care provider.  Pap test.** / Every 3 years starting at age 30 years through age 65 or 70 years with a history of 3 consecutive normal Pap tests.  HPV screening.** / Every 3 years from ages 30 years through ages 65 to 70 years with a history of 3 consecutive normal Pap tests.  Fecal occult blood test (FOBT) of stool. / Every year beginning at age 50 years and continuing until age 75 years. You may not need to do this test if you get a colonoscopy every 10 years.  Flexible sigmoidoscopy or colonoscopy.** / Every 5 years for a flexible sigmoidoscopy or every 10 years for a colonoscopy beginning at age 50 years and continuing until age 75 years.  Hepatitis C blood test.** / For all people born from 1945 through 1965 and any individual with known risks for hepatitis C.  Skin self-exam. / Monthly.  Influenza vaccine. / Every year.  Tetanus, diphtheria, and acellular pertussis (Tdap/Td) vaccine.** / Consult your health care provider. Pregnant women should receive 1 dose of Tdap vaccine during each pregnancy. 1 dose of Td every 10 years.  Varicella vaccine.** / Consult your health care provider. Pregnant females who do not have evidence of immunity should receive the first dose after pregnancy.  Zoster vaccine.** / 1 dose for adults aged 60 years or older.  Measles, mumps, rubella (MMR) vaccine.** / You need at least 1 dose of MMR if you were born in 1957 or later. You may also need a 2nd dose. For females of childbearing age, rubella immunity should be determined. If there is no evidence of immunity, females who are not pregnant should be vaccinated. If there is no evidence of immunity, females who are pregnant should delay immunization until after pregnancy.  Pneumococcal 13-valent conjugate (PCV13) vaccine.** / Consult your health care provider.  Pneumococcal polysaccharide (PPSV23) vaccine.** / 1 to 2 doses if  you smoke cigarettes or if you have certain conditions.  Meningococcal vaccine.** / Consult your health care provider.  Hepatitis A vaccine.** / Consult your health care provider.  Hepatitis B vaccine.** / Consult your health care provider.  Haemophilus influenzae type b (Hib) vaccine.** / Consult your health care provider.  Ages 65 years and over  Blood pressure check.** / Every 1 to 2 years.  Lipid and cholesterol check.** / Every 5 years beginning at age 20 years.  Lung cancer screening. / Every year if you are aged 55 80 years and have a 30-pack-year history of smoking and currently smoke or have quit within the past 15 years. Yearly screening is stopped once you have quit smoking for at least 15 years or develop a health problem that   would prevent you from having lung cancer treatment.  Clinical breast exam.** / Every year after age 103 years.  BRCA-related cancer risk assessment.** / For women who have family members with a BRCA-related cancer (breast, ovarian, tubal, or peritoneal cancers).  Mammogram.** / Every year beginning at age 36 years and continuing for as long as you are in good health. Consult with your health care provider.  Pap test.** / Every 3 years starting at age 5 years through age 85 or 10 years with 3 consecutive normal Pap tests. Testing can be stopped between 65 and 70 years with 3 consecutive normal Pap tests and no abnormal Pap or HPV tests in the past 10 years.  HPV screening.** / Every 3 years from ages 93 years through ages 70 or 45 years with a history of 3 consecutive normal Pap tests. Testing can be stopped between 65 and 70 years with 3 consecutive normal Pap tests and no abnormal Pap or HPV tests in the past 10 years.  Fecal occult blood test (FOBT) of stool. / Every year beginning at age 8 years and continuing until age 45 years. You may not need to do this test if you get a colonoscopy every 10 years.  Flexible sigmoidoscopy or colonoscopy.** /  Every 5 years for a flexible sigmoidoscopy or every 10 years for a colonoscopy beginning at age 69 years and continuing until age 68 years.  Hepatitis C blood test.** / For all people born from [birth year] through 1963 and any individual with known risks for hepatitis C.  Osteoporosis screening.** / A one-time screening for women ages 7 years and over and women at risk for fractures or osteoporosis.  Skin self-exam. / Monthly.  Influenza vaccine. / Every year.  Tetanus, diphtheria, and acellular pertussis (Tdap/Td) vaccine.** / 1 dose of Td every 10 years.  Varicella vaccine.** / Consult your health care provider.  Zoster vaccine.** / 1 dose for adults aged 5 years or older.  Pneumococcal 13-valent conjugate (PCV13) vaccine.** / Consult your health care provider.  Pneumococcal polysaccharide (PPSV23) vaccine.** / 1 dose for all adults aged 74 years and older.  Meningococcal vaccine.** / Consult your health care provider.  Hepatitis A vaccine.** / Consult your health care provider.  Hepatitis B vaccine.** / Consult your health care provider.  Haemophilus influenzae type b (Hib) vaccine.** / Consult your health care provider. ** Family history and personal history of risk and conditions may change your health care provider's recommendations. Document Released: 09/12/2001 Document Revised: 05/07/2013  [facility] Patient Information 2014 [place], [place].   EXERCISE AND DIET:  We recommended that you start or continue a regular exercise program for good health. Regular exercise means any activity that makes your heart beat faster and makes you sweat.  We recommend exercising at least 30 minutes per day at least 3 days a week, preferably 5.  We also recommend a diet low in fat and sugar / carbohydrates.  Inactivity, poor dietary choices and obesity can cause diabetes, heart attack, stroke, and kidney damage, among others.     ALCOHOL AND SMOKING:  Women should limit their alcohol intake to no  more than 7 drinks/beers/glasses of wine (combined, not each!) per week. Moderation of alcohol intake to this level decreases your risk of breast cancer and liver damage.  ( And of course, no recreational drugs are part of a healthy lifestyle.)  Also, you should not be smoking at all or even being exposed to second hand smoke. Most people know smoking can  cause cancer, and various heart and lung diseases, but did you know it also contributes to weakening of your bones?  Aging of your skin?  Yellowing of your teeth and nails?   CALCIUM AND VITAMIN D:  Adequate intake of calcium and Vitamin D are recommended.  The recommendations for exact amounts of these supplements seem to change often, but generally speaking 600 mg of calcium (either carbonate or citrate) and 800 units of Vitamin D per day seems prudent. Certain women may benefit from higher intake of Vitamin D.  If you are among these women, your doctor will have told you during your visit.     PAP SMEARS:  Pap smears, to check for cervical cancer or precancers,  have traditionally been done yearly, although recent scientific advances have shown that most women can have pap smears less often.  However, every woman still should have a physical exam from her gynecologist or primary care physician every year. It will include a breast check, inspection of the vulva and vagina to check for abnormal growths or skin changes, a visual exam of the cervix, and then an exam to evaluate the size and shape of the uterus and ovaries.  And after 50 years of age, a rectal exam is indicated to check for rectal cancers. We will also provide age appropriate advice regarding health maintenance, like when you should have certain vaccines, screening for sexually transmitted diseases, bone density testing, colonoscopy, mammograms, etc.    MAMMOGRAMS:  All women over 65 years old should have a yearly mammogram. Many facilities now offer a "3D" mammogram, which may cost  around $50 extra out of pocket. If possible,  we recommend you accept the option to have the 3D mammogram performed.  It both reduces the number of women who will be called back for extra views which then turn out to be normal, and it is better than the routine mammogram at detecting truly abnormal areas.     COLONOSCOPY:  Colonoscopy to screen for colon cancer is recommended for all women at age 26.  We know, you hate the idea of the prep.  We agree, BUT, having colon cancer and not knowing it is worse!!  Colon cancer so often starts as a polyp that can be seen and removed at colonscopy, which can quite literally save your life!  And if your first colonoscopy is normal and you have no family history of colon cancer, most women don't have to have it again for 10 years.  Once every ten years, you can do something that may end up saving your life, right?  We will be happy to help you get it scheduled when you are ready.  Be sure to check your insurance coverage so you understand how much it will cost.  It may be covered as a preventative service at no cost, but you should check your particular policy.    Mediterranean Diet A Mediterranean diet refers to food and lifestyle choices that are based on the traditions of countries located on the [employer]. This way of eating has been shown to help prevent certain conditions and improve outcomes for people who have chronic diseases, like kidney disease and heart disease. What are tips for following this plan? Lifestyle  Cook and eat meals together with your family, when possible.  Drink enough fluid to keep your urine clear or pale yellow.  Be physically active every day. This includes: ? Aerobic exercise like running or swimming. ? Leisure activities like  gardening, walking, or housework.  Get 7-8 hours of sleep each night.  If recommended by your health care provider, drink red wine in moderation. This means 1 glass a day for nonpregnant women  and 2 glasses a day for men. A glass of wine equals 5 oz (150 mL). Reading food labels  Check the serving size of packaged foods. For foods such as rice and pasta, the serving size refers to the amount of cooked product, not dry.  Check the total fat in packaged foods. Avoid foods that have saturated fat or trans fats.  Check the ingredients list for added sugars, such as corn syrup. Shopping  At the grocery store, buy most of your food from the areas near the walls of the store. This includes: ? Fresh fruits and vegetables (produce). ? Grains, beans, nuts, and seeds. Some of these may be available in unpackaged forms or large amounts (in bulk). ? Fresh seafood. ? Poultry and eggs. ? Low-fat dairy products.  Buy whole ingredients instead of prepackaged foods.  Buy fresh fruits and vegetables in-season from local farmers markets.  Buy frozen fruits and vegetables in resealable bags.  If you do not have access to quality fresh seafood, buy precooked frozen shrimp or canned fish, such as tuna, salmon, or sardines.  Buy small amounts of raw or cooked vegetables, salads, or olives from the deli or salad bar at your store.  Stock your pantry so you always have certain foods on hand, such as olive oil, canned tuna, canned tomatoes, rice, pasta, and beans. Cooking  Cook foods with extra-virgin olive oil instead of using butter or other vegetable oils.  Have meat as a side dish, and have vegetables or grains as your main dish. This means having meat in small portions or adding small amounts of meat to foods like pasta or stew.  Use beans or vegetables instead of meat in common dishes like chili or lasagna.  Experiment with different cooking methods. Try roasting or broiling vegetables instead of steaming or sauteing them.  Add frozen vegetables to soups, stews, pasta, or rice.  Add nuts or seeds for added healthy fat at each meal. You can add these to yogurt, salads, or vegetable  dishes.  Marinate fish or vegetables using olive oil, lemon juice, garlic, and fresh herbs. Meal planning  Plan to eat 1 vegetarian meal one day each week. Try to work up to 2 vegetarian meals, if possible.  Eat seafood 2 or more times a week.  Have healthy snacks readily available, such as: ? Vegetable sticks with hummus. ? [place] yogurt. ? Fruit and nut trail mix.  Eat balanced meals throughout the week. This includes: ? Fruit: 2-3 servings a day ? Vegetables: 4-5 servings a day ? Low-fat dairy: 2 servings a day ? Fish, poultry, or lean meat: 1 serving a day ? Beans and legumes: 2 or more servings a week ? Nuts and seeds: 1-2 servings a day ? Whole grains: 6-8 servings a day ? Extra-virgin olive oil: 3-4 servings a day  Limit red meat and sweets to only a few servings a month What are my food choices?  Mediterranean diet ? Recommended ? Grains: Whole-grain pasta. Brown rice. Bulgar wheat. Polenta. Couscous. Whole-wheat bread. [place]. ? Vegetables: Artichokes. Beets. Broccoli. Cabbage. Carrots. Eggplant. Green beans. Chard. Kale. Spinach. Onions. Leeks. Peas. Squash. Tomatoes. Peppers. Radishes. ? Fruits: Apples. Apricots. Avocado. Berries. Bananas. Cherries. Dates. Figs. Grapes. Lemons. Melon. Oranges. Peaches. Plums. Pomegranate. ? Meats and other protein  foods: Beans. Almonds. Sunflower seeds. Pine nuts. Peanuts. [place]. Salmon. Scallops. Shrimp. [place]. Tilapia. Clams. Oysters. Eggs. ? Dairy: Low-fat milk. Cheese. Greek yogurt. ? Beverages: Water. Red wine. Herbal tea. ? Fats and oils: Extra virgin olive oil. Avocado oil. Grape seed oil. ? Sweets and desserts: [place] yogurt with honey. Baked apples. Poached pears. Trail mix. ? Seasoning and other foods: Basil. Cilantro. Coriander. Cumin. Mint. Parsley. Sage. Rosemary. Tarragon. Garlic. Oregano. Thyme. Pepper. Balsalmic vinegar. Tahini. Hummus. Tomato sauce. Olives. Mushrooms. ? Limit these ? Grains: Prepackaged pasta or  rice dishes. Prepackaged cereal with added sugar. ? Vegetables: Deep fried potatoes (french fries). ? Fruits: Fruit canned in syrup. ? Meats and other protein foods: Beef. Pork. Lamb. Poultry with skin. Hot dogs. Berniece Salines. ? Dairy: Ice cream. Sour cream. Whole milk. ? Beverages: Juice. Sugar-sweetened soft drinks. Beer. Liquor and spirits. ? Fats and oils: Butter. Canola oil. Vegetable oil. Beef fat (tallow). Lard. ? Sweets and desserts: Cookies. Cakes. Pies. Candy. ? Seasoning and other foods: Mayonnaise. Premade sauces and marinades. ? The items listed may not be a complete list. Talk with your dietitian about what dietary choices are right for you. Summary  The Mediterranean diet includes both food and lifestyle choices.  Eat a variety of fresh fruits and vegetables, beans, nuts, seeds, and whole grains.  Limit the amount of red meat and sweets that you eat.  Talk with your health care provider about whether it is safe for you to drink red wine in moderation. This means 1 glass a day for nonpregnant women and 2 glasses a day for men. A glass of wine equals 5 oz (150 mL). This information is not intended to replace advice given to you by your health care provider. Make sure you discuss any questions you have with your health care provider. Document Released: 03/09/2016 Document Revised: 04/11/2016 Document Reviewed: 03/09/2016 Elsevier Interactive Patient Education  2018 [employer].   Exercising to [employer] Exercising can help you to lose weight. In order to lose weight through exercise, you need to do vigorous-intensity exercise. You can tell that you are exercising with vigorous intensity if you are breathing very hard and fast and cannot hold a conversation while exercising. Moderate-intensity exercise helps to maintain your current weight. You can tell that you are exercising at a moderate level if you have a higher heart rate and faster breathing, but you are still able to hold a  conversation. How often should I exercise? Choose an activity that you enjoy and set realistic goals. Your health care provider can help you to make an activity plan that works for you. Exercise regularly as directed by your health care provider. This may include:  Doing resistance training twice each week, such as: ? Push-ups. ? Sit-ups. ? Lifting weights. ? Using resistance bands.  Doing a given intensity of exercise for a given amount of time. Choose from these options: ? 150 minutes of moderate-intensity exercise every week. ? 75 minutes of vigorous-intensity exercise every week. ? A mix of moderate-intensity and vigorous-intensity exercise every week.  Children, pregnant women, people who are out of shape, people who are overweight, and older adults may need to consult a health care provider for individual recommendations. If you have any sort of medical condition, be sure to consult your health care provider before starting a new exercise program. What are some activities that can help me to lose weight?  Walking at a rate of at least 4.5 miles an hour.  Jogging or  running at a rate of 5 miles per hour.  Biking at a rate of at least 10 miles per hour.  Lap swimming.  Roller-skating or in-line skating.  Cross-country skiing.  Vigorous competitive sports, such as football, basketball, and soccer.  Jumping rope.  Aerobic dancing. How can I be more active in my day-to-day activities?  Use the stairs instead of the elevator.  Take a walk during your lunch break.  If you drive, park your car farther away from work or school.  If you take public transportation, get off one stop early and walk the rest of the way.  Make all of your phone calls while standing up and walking around.  Get up, stretch, and walk around every 30 minutes throughout the day. What guidelines should I follow while exercising?  Do not exercise so much that you hurt yourself, feel dizzy, or get  very short of breath.  Consult your health care provider prior to starting a new exercise program.  Wear comfortable clothes and shoes with good support.  Drink plenty of water while you exercise to prevent dehydration or heat stroke. Body water is lost during exercise and must be replaced.  Work out until you breathe faster and your heart beats faster. This information is not intended to replace advice given to you by your health care provider. Make sure you discuss any questions you have with your health care provider. Document Released: 08/19/2010 Document Revised: 12/23/2015 Document Reviewed: 12/18/2013 Elsevier Interactive Patient Education  [employer].  Please continue all medications as directed. You have lost 2 lbs since last office visit in Feb 2019- GREAT JOB! Increase water intake, strive for at least 100 ounces/day.   Follow Heart Healthy diet Increase regular exercise.  Recommend at least 30 minutes daily, 5 days per week of walking, jogging, biking, swimming, YouTube/Pinterest workout videos. We will re-start Phentermine in 3-4 weeks, please call clinic. Final 3 months of Phentermine will be provided.  Medication is meant to assist is lifestyle change/weight loss, not a permanent regime. Follow-up in 3 months. NICE TO SEE YOU!

## 2017-12-20 NOTE — Assessment & Plan Note (Signed)
>>  ASSESSMENT AND PLAN FOR BMI 35.0-35.9,ADULT WRITTEN ON 12/20/2017  8:49 AM BY DANFORD, KATY D, NP  Body mass index is 31.41 kg/m.  Current wt 194 Increase water intake, strive for at least 100 ounces/day.   Follow Heart Healthy diet Increase regular exercise.  Recommend at least 30 minutes daily, 5 days per week of walking, jogging, biking, swimming, YouTube/Pinterest workout videos. Will re-start Phentermine in 3/4 weeks, for a final 3 month course Rx is meant to augment TLC

## 2017-12-20 NOTE — Assessment & Plan Note (Signed)
Body mass index is 31.41 kg/m.  Current wt 194 Increase water intake, strive for at least 100 ounces/day.   Follow Heart Healthy diet Increase regular exercise.  Recommend at least 30 minutes daily, 5 days per week of walking, jogging, biking, swimming, YouTube/Pinterest workout videos. Will re-start Phentermine in 3/4 weeks, for a final 3 month course Rx is meant to augment TLC

## 2018-01-02 ENCOUNTER — Other Ambulatory Visit: Payer: Self-pay | Admitting: Adult Health

## 2018-01-21 ENCOUNTER — Other Ambulatory Visit: Payer: Self-pay | Admitting: Adult Health

## 2018-01-21 MED ORDER — PHENTERMINE HCL 37.5 MG PO TABS: 37.5000 mg | ORAL_TABLET | Freq: Every day | ORAL | 0 refills | Status: DC

## 2018-01-21 NOTE — Telephone Encounter (Signed)
Pt informed.  T. Amarrion Pastorino, CMA 

## 2018-01-21 NOTE — Telephone Encounter (Signed)
Patient called states was told to call office for Rx refill on weight loss med--  ----- Pt did not know the name of Rx but states to sent to:  Preferred Pharmacies      [address] [place] [id number] [phone] (Phone) [phone] (Fax)     Forwarding message to medical assistant--glh

## 2018-01-21 NOTE — Telephone Encounter (Signed)
Good Afternoon [name], I sent in refill  This will be 1/3 final rx's of phentermine  Thanks! Orpha BurKaty

## 2018-02-07 ENCOUNTER — Other Ambulatory Visit: Payer: Self-pay

## 2018-02-11 ENCOUNTER — Other Ambulatory Visit: Payer: Self-pay | Admitting: Adult Health

## 2018-02-11 MED ORDER — PHENTERMINE HCL 37.5 MG PO TABS: 37.5000 mg | ORAL_TABLET | Freq: Every day | ORAL | 0 refills | Status: DC

## 2018-02-11 NOTE — Telephone Encounter (Signed)
Patient called in panic, states she is leaving town for a week & needs her diet Rx refill asap ( [facility] for:  phentermine (ADIPEX-P) 37.5 MG tablet [[id number]][[id number]]   Order Details  Dose: 37.5 mg Route: Oral Frequency: Daily before breakfast  Dispense Quantity: 30 tablet Refills: 0 Fills remaining: --        Sig: Take 1 tablet (37.5 mg total) by mouth daily before breakfast.          ------- patient is leaving town for a week & will be out during that time, she will requesting [facility] before 12:30.  ---forwarding follow request to medical assistant.  --Fausto Skillernglh

## 2018-02-26 ENCOUNTER — Other Ambulatory Visit: Payer: Self-pay | Admitting: Adult Health

## 2018-02-26 ENCOUNTER — Telehealth: Payer: Self-pay | Admitting: Adult Health

## 2018-02-26 MED ORDER — LORAZEPAM 0.5 MG PO TABS: 0.5000 mg | ORAL_TABLET | Freq: Two times a day (BID) | ORAL | 0 refills | Status: DC | PRN

## 2018-02-26 NOTE — Telephone Encounter (Signed)
Short rx of low dose [place]azepam [place] [facility] [place] [place]. Thanks! Karen BurKaty

## 2018-02-26 NOTE — Telephone Encounter (Signed)
Please review.  T. Nelson, CMA °

## 2018-02-26 NOTE — Telephone Encounter (Signed)
Patient is sch Thursday with Derm to have a spot on lip removed and is requesting something for the day of to help with nerves to get her by. If approved [facility]. Please advise

## 2018-02-26 NOTE — Progress Notes (Unsigned)
Low dose, short rx for Lorazepam sent in for acute anxiety r/t to [facility] [place] [facility] [place]- no aberrancies noted She has completed course of Phentermine- no RFs will be provided

## 2018-03-11 NOTE — Progress Notes (Signed)
Subjective:    Patient ID: Karen Gross, female    DOB: 04/23/1966, 52 y.o.   MRN: [id number]  HPI:  Ms. Karen Gross presents for medical wt loss. She has completed two 3 month course on phentermine 37.5mg  QD She has lost another 2 lbs since last OV in May 2019 She continues to drink >100 oz water/day and eats a diet high in fruits, vegetables, and lean protein. She walks 60mins 4 days/week She denies tobacco/ETOH She would like to loss at least another 15 lbs, goal wt in 180s She is also concerned about chronic Omeprazole 20mg  QD, she has been taking >10 years When she goes off PPI she reports sig GERD sx's She has never seen GI  Patient Care Team    Relationship Specialty Notifications Start End  William Hamburgeranford, Exander Shaul D, NP PCP - General Family Medicine  12/27/16   Nita SellsHall, John, MD  Dermatology  12/27/16     Patient Active Problem List   Diagnosis Date Noted  . Healthcare maintenance 09/27/2017  . Excessive cerumen in ear canal, bilateral 04/18/2017  . GERD (gastroesophageal reflux disease) 12/27/2016  . BMI 31.0-31.9,adult 12/27/2016  . Hypothyroidism 10/23/2015  . Migraine 10/23/2015     Past Medical History:  Diagnosis Date  . Anxiety   . Bartholin cyst   . Hypothyroidism   . Seizures (HCC)    Stress Induced     Past Surgical History:  Procedure Laterality Date  . ABDOMINAL HYSTERECTOMY    . KNEE ARTHROSCOPY Right      Family History  Problem Relation Age of Onset  . Breast cancer Mother   . Heart disease Mother   . Breast cancer Paternal Grandmother      Social History   Substance and Sexual Activity  Drug Use No     Social History   Substance and Sexual Activity  Alcohol Use Yes   Comment: once a month- wine     Social History   Tobacco Use  Smoking Status Never Smoker  Smokeless Tobacco Never Used     Outpatient Encounter Medications as of 03/12/2018  Medication Sig  . ARIPiprazole (ABILIFY) 10 MG tablet Take 10 mg by mouth daily.  [name].  levothyroxine (SYNTHROID, LEVOTHROID) 100 MCG tablet TAKE 1 TABLET BY MOUTH DAILY BEFORE BREAKFAST.  [name]. Liraglutide -Weight Management (SAXENDA) 18 MG/3ML SOPN Inject 0.6 mg into the skin daily for 7 days, THEN 1.2 mg daily for 7 days, THEN 1.8 mg daily for 7 days, THEN 2.4 mg daily for 7 days, THEN 3 mg daily for 7 days.  [name]. omeprazole (PRILOSEC) 20 MG capsule Take 20 mg by mouth daily.  [name]. topiramate (TOPAMAX) 100 MG tablet TAKE 2 TABLETS BY MOUTH IN THE MORNING AND 1 TABLET IN THE EVENING  . [DISCONTINUED] LORazepam (ATIVAN) 0.5 MG tablet Take 1 tablet (0.5 mg total) by mouth 2 (two) times daily as needed for anxiety.  . [DISCONTINUED] phentermine (ADIPEX-P) 37.5 MG tablet Take 1 tablet (37.5 mg total) by mouth daily before breakfast.  . [DISCONTINUED] topiramate (TOPAMAX) 100 MG tablet Take 1 tablet (100 mg total) by mouth 2 (two) times daily.   No facility-administered encounter medications on file as of 03/12/2018.     Allergies: Patient has no known allergies.  Body mass index is 31.41 kg/m.  Blood pressure 92/61, pulse 72, height 5\' 6"  (1.676 m), weight 194 lb 9.6 oz (88.3 kg), SpO2 100 %.   Review of Systems  Constitutional: Positive for fatigue. Negative for activity change,  appetite change, chills, diaphoresis, fever and unexpected weight change.  HENT: Negative for congestion.   Eyes: Negative for visual disturbance.  Respiratory: Negative for cough, chest tightness, shortness of breath, wheezing and stridor.   Cardiovascular: Negative for chest pain, palpitations and leg swelling.  Gastrointestinal: Negative for abdominal distention, abdominal pain, blood in stool, constipation, diarrhea, nausea, rectal pain and vomiting.  Endocrine: Negative for cold intolerance, heat intolerance, polydipsia, polyphagia and polyuria.  Genitourinary: Negative for difficulty urinating and flank pain.  Musculoskeletal: Negative for arthralgias, gait problem, joint swelling and myalgias.   Neurological: Negative for dizziness, seizures and headaches.  Hematological: Does not bruise/bleed easily.  Psychiatric/Behavioral: Negative for behavioral problems, confusion, decreased concentration, dysphoric mood, hallucinations, self-injury, sleep disturbance and suicidal ideas. The patient is not nervous/anxious and is not hyperactive.        Objective:   Physical Exam  Constitutional: She is oriented to person, place, and time. She appears well-developed and well-nourished. No distress.  HENT:  Head: Normocephalic and atraumatic.  Right Ear: External ear normal.  Left Ear: External ear normal.  Nose: Nose normal.  Mouth/Throat: Oropharynx is clear and moist.  Eyes: Pupils are equal, round, and reactive to light. Conjunctivae and EOM are normal.  Cardiovascular: Normal rate, regular rhythm, normal heart sounds and intact distal pulses.  No murmur heard. Pulmonary/Chest: Effort normal and breath sounds normal. No stridor. No respiratory distress. She has no wheezes. She has no rales. She exhibits no tenderness.  Neurological: She is alert and oriented to person, place, and time.  Skin: Skin is warm and dry. Capillary refill takes less than 2 seconds. No rash noted. She is not diaphoretic. No erythema. No pallor.  Psychiatric: She has a normal mood and affect. Her behavior is normal. Judgment and thought content normal.  Nursing note and vitals reviewed.     Assessment & Plan:   1. Gastroesophageal reflux disease, esophagitis presence not specified   2. Other migraine without status migrainosus, not intractable   3. BMI 31.0-31.9,adult   4. Healthcare maintenance     GERD (gastroesophageal reflux disease) Take Omeprazole 20mg  every other day Referral to GI placed  Migraine Well controlled on Topiramate 100mg -2 tabs QAM, 1 tab QHS  BMI 31.0-31.9,adult She has completed two 3 month courses of Phentermine Started on Liraglutide per taper schedule Increase intensity and  frequency of exercise Continue excellent hydration and healthy eating   Healthcare maintenance Start Liraglutide per taper schedule. Increase intensity and frequency of exercise. Continue excellent hydration and healthy eating. GREAT JOB on your overall healthy lifestyle and weight loss. Referral placed to Gastroenterologist, re: GERD and Colonoscopy. Follow-up in 6 months, sooner if needed.    FOLLOW-UP:  Return in about 6 months (around 09/12/2018) for Regular Follow Up, Obesity, migraine.

## 2018-03-12 ENCOUNTER — Ambulatory Visit (INDEPENDENT_AMBULATORY_CARE_PROVIDER_SITE_OTHER): Payer: BC Managed Care – PPO | Admitting: Adult Health

## 2018-03-12 ENCOUNTER — Encounter: Payer: Self-pay | Admitting: Adult Health

## 2018-03-12 VITALS — BP 92/61 | HR 72 | Ht 66.0 in | Wt 194.6 lb

## 2018-03-12 DIAGNOSIS — Z6831 Body mass index (BMI) 31.0-31.9, adult: Secondary | ICD-10-CM | POA: Diagnosis not present

## 2018-03-12 DIAGNOSIS — G43809 Other migraine, not intractable, without status migrainosus: Secondary | ICD-10-CM

## 2018-03-12 DIAGNOSIS — Z0000 Encounter for general adult medical examination without abnormal findings: Secondary | ICD-10-CM | POA: Diagnosis not present

## 2018-03-12 DIAGNOSIS — Z1211 Encounter for screening for malignant neoplasm of colon: Secondary | ICD-10-CM

## 2018-03-12 DIAGNOSIS — K219 Gastro-esophageal reflux disease without esophagitis: Secondary | ICD-10-CM

## 2018-03-12 MED ORDER — LIRAGLUTIDE -WEIGHT MANAGEMENT 18 MG/3ML ~~LOC~~ SOPN: PEN_INJECTOR | SUBCUTANEOUS | 3 refills | Status: AC

## 2018-03-12 NOTE — Assessment & Plan Note (Signed)
Start Liraglutide per taper schedule. Increase intensity and frequency of exercise. Continue excellent hydration and healthy eating. GREAT JOB on your overall healthy lifestyle and weight loss. Referral placed to Gastroenterologist, re: GERD and Colonoscopy. Follow-up in 6 months, sooner if needed.

## 2018-03-12 NOTE — Assessment & Plan Note (Signed)
>>  ASSESSMENT AND PLAN FOR BMI 35.0-35.9,ADULT WRITTEN ON 03/12/2018  9:57 AM BY DANFORD, KATY D, NP  She has completed two 3 month courses of Phentermine Started on Liraglutide per taper schedule Increase intensity and frequency of exercise Continue excellent hydration and healthy eating

## 2018-03-12 NOTE — Addendum Note (Signed)
Addended by: Stan HeadNELSON, Alverto Shedd S on: 03/12/2018 10:06 AM   Modules accepted: Orders

## 2018-03-12 NOTE — Assessment & Plan Note (Signed)
Take Omeprazole 20mg  every other day Referral to GI placed

## 2018-03-12 NOTE — Assessment & Plan Note (Signed)
She has completed two 3 month courses of Phentermine Started on Liraglutide per taper schedule Increase intensity and frequency of exercise Continue excellent hydration and healthy eating

## 2018-03-12 NOTE — Patient Instructions (Signed)
Exercising to Lose Weight Exercising can help you to lose weight. In order to lose weight through exercise, you need to do vigorous-intensity exercise. You can tell that you are exercising with vigorous intensity if you are breathing very hard and fast and cannot hold a conversation while exercising. Moderate-intensity exercise helps to maintain your current weight. You can tell that you are exercising at a moderate level if you have a higher heart rate and faster breathing, but you are still able to hold a conversation. How often should I exercise? Choose an activity that you enjoy and set realistic goals. Your health care provider can help you to make an activity plan that works for you. Exercise regularly as directed by your health care provider. This may include:  Doing resistance training twice each week, such as: ? Push-ups. ? Sit-ups. ? Lifting weights. ? Using resistance bands.  Doing a given intensity of exercise for a given amount of time. Choose from these options: ? 150 minutes of moderate-intensity exercise every week. ? 75 minutes of vigorous-intensity exercise every week. ? A mix of moderate-intensity and vigorous-intensity exercise every week.  Children, pregnant women, people who are out of shape, people who are overweight, and older adults may need to consult a health care provider for individual recommendations. If you have any sort of medical condition, be sure to consult your health care provider before starting a new exercise program. What are some activities that can help me to lose weight?  Walking at a rate of at least 4.5 miles an hour.  Jogging or running at a rate of 5 miles per hour.  Biking at a rate of at least 10 miles per hour.  Lap swimming.  Roller-skating or in-line skating.  Cross-country skiing.  Vigorous competitive sports, such as football, basketball, and soccer.  Jumping rope.  Aerobic dancing. How can I be more active in my day-to-day  activities?  Use the stairs instead of the elevator.  Take a walk during your lunch break.  If you drive, park your car farther away from work or school.  If you take public transportation, get off one stop early and walk the rest of the way.  Make all of your phone calls while standing up and walking around.  Get up, stretch, and walk around every 30 minutes throughout the day. What guidelines should I follow while exercising?  Do not exercise so much that you hurt yourself, feel dizzy, or get very short of breath.  Consult your health care provider prior to starting a new exercise program.  Wear comfortable clothes and shoes with good support.  Drink plenty of water while you exercise to prevent dehydration or heat stroke. Body water is lost during exercise and must be replaced.  Work out until you breathe faster and your heart beats faster. This information is not intended to replace advice given to you by your health care provider. Make sure you discuss any questions you have with your health care provider. Document Released: 08/19/2010 Document Revised: 12/23/2015 Document Reviewed: 12/18/2013 Elsevier Interactive Patient Education  2018 [employer]   Start Liraglutide per taper schedule. Increase intensity and frequency of exercise. Continue excellent hydration and healthy eating. GREAT JOB on your overall healthy lifestyle and weight loss. Referral placed to Gastroenterologist, re: GERD and Colonoscopy. Follow-up in 6 months, sooner if needed. HAPPY EARLY BIRTHDAY!

## 2018-03-12 NOTE — Assessment & Plan Note (Signed)
Well controlled on Topiramate 100mg -2 tabs QAM, 1 tab QHS

## 2018-03-13 ENCOUNTER — Encounter: Payer: Self-pay | Admitting: Gastroenterology

## 2018-03-18 ENCOUNTER — Other Ambulatory Visit: Payer: Self-pay | Admitting: Obstetrics and Gynecology

## 2018-03-18 DIAGNOSIS — Z1231 Encounter for screening mammogram for malignant neoplasm of breast: Secondary | ICD-10-CM

## 2018-03-20 NOTE — Progress Notes (Signed)
Subjective:    Patient ID: Karen Gross, female    DOB: 08/24/1965, 52 y.o.   MRN: [id number]  HPI:  Karen Gross presents with post nasal gtt, morning non-productive cough, and frontal HA- constant dull ache, rated 8/10. She reports sx's present for 48 hrs. She denies fever/night sweats/chillsN/V/D/body aches She denies CP/dyspnea/dizziness/palpitations/cough She reports taking OTC Tylenol Cold/Flu that helped minimally and OTC Sudafed that "did nothing for me". 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 Acute viral sinusitis 03/21/2018  . Healthcare maintenance 09/27/2017  . Excessive cerumen in ear canal, bilateral 04/18/2017  . GERD (gastroesophageal reflux disease) 12/27/2016  . BMI 31.0-31.9,adult 12/27/2016  . Hypothyroidism 10/23/2015  . Migraine 10/23/2015     Past Medical History:  Diagnosis Date  . Anxiety   . Bartholin cyst   . Hypothyroidism   . Seizures (HCC)    Stress Induced     Past Surgical History:  Procedure Laterality Date  . ABDOMINAL HYSTERECTOMY    . KNEE ARTHROSCOPY Right      Family History  Problem Relation Age of Onset  . Breast cancer Mother   . Heart disease Mother   . Breast cancer Paternal Grandmother      Social History   Substance and Sexual Activity  Drug Use No     Social History   Substance and Sexual Activity  Alcohol Use Yes   Comment: once a month- wine     Social History   Tobacco Use  Smoking Status Never Smoker  Smokeless Tobacco Never Used     Outpatient Encounter Medications as of 03/21/2018  Medication Sig  . ARIPiprazole (ABILIFY) 10 MG tablet Take 10 mg by mouth daily.  [name]. levothyroxine (SYNTHROID, LEVOTHROID) 100 MCG  tablet TAKE 1 TABLET BY MOUTH DAILY BEFORE BREAKFAST.  [name]. Liraglutide -Weight Management (SAXENDA) 18 MG/3ML SOPN Inject 0.6 mg into the skin daily for 7 days, THEN 1.2 mg daily for 7 days, THEN 1.8 mg daily for 7 days, THEN 2.4 mg daily for 7 days, THEN 3 mg daily for 7 days.  [name]. omeprazole (PRILOSEC) 20 MG capsule Take 20 mg by mouth daily.  [name]. topiramate (TOPAMAX) 100 MG tablet Take 100 mg by mouth daily.  . [DISCONTINUED] topiramate (TOPAMAX) 100 MG tablet TAKE 2 TABLETS BY MOUTH IN THE MORNING AND 1 TABLET IN THE EVENING   No facility-administered encounter medications on file as of 03/21/2018.     Allergies: Patient has no known allergies.  Body mass index is 31.34 kg/m.  Temperature 98.2 F (36.8 C), temperature source Oral, height 5\' 6"  (1.676 m), weight 194 lb 3.2 oz (88.1 kg), SpO2 100 %.  Review of Systems  Constitutional: Positive for activity change, appetite change and fatigue. Negative for chills, diaphoresis, fever and unexpected weight change.  HENT: Positive for congestion, postnasal drip, sinus pressure and sore throat. Negative for ear discharge, ear pain, rhinorrhea, sinus pain, tinnitus, trouble swallowing and voice change.   Eyes: Negative for visual disturbance.  Respiratory: Positive for cough. Negative for chest tightness, shortness of breath, wheezing and stridor.   Cardiovascular: Negative for chest pain, palpitations and leg swelling.  Gastrointestinal: Negative for abdominal distention,  abdominal pain, blood in stool, constipation, diarrhea, nausea and vomiting.  Endocrine: Negative for cold intolerance, heat intolerance, polydipsia, polyphagia and polyuria.  Genitourinary: Negative for difficulty urinating and flank pain.  Neurological: Positive for headaches. Negative for dizziness, weakness and light-headedness.  Hematological: Does not bruise/bleed easily.  Psychiatric/Behavioral: Negative for behavioral problems, confusion, decreased concentration,  dysphoric mood, hallucinations, self-injury, sleep disturbance and suicidal ideas. The patient is not nervous/anxious and is not hyperactive.        Objective:   Physical Exam  Constitutional: She is oriented to person, place, and time. She appears well-developed and well-nourished. She appears ill.  HENT:  Head: Normocephalic and atraumatic.  Right Ear: External ear normal. Tympanic membrane is bulging. Tympanic membrane is not erythematous. No decreased hearing is noted.  Left Ear: External ear normal. Tympanic membrane is bulging. Tympanic membrane is not erythematous. No decreased hearing is noted.  Nose: Nose normal. No mucosal edema or rhinorrhea. Right sinus exhibits no maxillary sinus tenderness and no frontal sinus tenderness. Left sinus exhibits no maxillary sinus tenderness and no frontal sinus tenderness.  Mouth/Throat: Uvula is midline and mucous membranes are normal. Posterior oropharyngeal erythema present. No oropharyngeal exudate, posterior oropharyngeal edema or tonsillar abscesses.  Eyes: Pupils are equal, round, and reactive to light. Conjunctivae and EOM are normal.  Neck: Normal range of motion. Neck supple.  Cardiovascular: Normal rate, regular rhythm, normal heart sounds and intact distal pulses.  No murmur heard. Pulmonary/Chest: Effort normal and breath sounds normal. No stridor. No respiratory distress. She has no wheezes. She has no rales. She exhibits no tenderness.  Lymphadenopathy:    She has no cervical adenopathy.  Neurological: She is alert and oriented to person, place, and time.  Skin: Skin is warm and dry. Capillary refill takes less than 2 seconds. No rash noted. No erythema. No pallor.  Psychiatric: She has a normal mood and affect. Her behavior is normal. Judgment and thought content normal.  Nursing note and vitals reviewed.     Assessment & Plan:   1. Acute viral sinusitis     Acute viral sinusitis  Since symptoms have only been present for  48 hrs, antibiotics are not indicated. Recommend that you increase fluids/rest/vit c - 2,000mg /day. Alterate OTC Acetaminophen and Ibuprofen for aches/pain. Recommend OTC Antihistamine (ie, Claritin, Allegra)-take daily for at least 7-10 days. If symptoms persist/worsen for another week, please call clinic and we will send in antibiotic.    FOLLOW-UP:  Return if symptoms worsen or fail to improve.

## 2018-03-21 ENCOUNTER — Ambulatory Visit: Payer: BC Managed Care – PPO | Admitting: Adult Health

## 2018-03-21 ENCOUNTER — Encounter: Payer: Self-pay | Admitting: Adult Health

## 2018-03-21 VITALS — Temp 98.2°F | Ht 66.0 in | Wt 194.2 lb

## 2018-03-21 DIAGNOSIS — B9789 Other viral agents as the cause of diseases classified elsewhere: Secondary | ICD-10-CM | POA: Diagnosis not present

## 2018-03-21 DIAGNOSIS — J019 Acute sinusitis, unspecified: Secondary | ICD-10-CM | POA: Diagnosis not present

## 2018-03-21 NOTE — Patient Instructions (Signed)

## 2018-03-21 NOTE — Assessment & Plan Note (Signed)
  Since symptoms have only been present for 48 hrs, antibiotics are not indicated. Recommend that you increase fluids/rest/vit c - 2,000mg /day. Alterate OTC Acetaminophen and Ibuprofen for aches/pain. Recommend OTC Antihistamine (ie, Claritin, Allegra)-take daily for at least 7-10 days. If symptoms persist/worsen for another week, please call clinic and we will send in antibiotic.

## 2018-03-25 ENCOUNTER — Telehealth: Payer: Self-pay | Admitting: Adult Health

## 2018-03-25 ENCOUNTER — Other Ambulatory Visit: Payer: Self-pay | Admitting: Adult Health

## 2018-03-25 MED ORDER — AMOXICILLIN-POT CLAVULANATE 875-125 MG PO TABS: 1.0000 | ORAL_TABLET | Freq: Two times a day (BID) | ORAL | 0 refills | Status: DC

## 2018-03-25 NOTE — Telephone Encounter (Signed)
Patient was seen last week for acute OV and was advised that if she didn't feel better to call our office for an antibiotic. Please advise and if approved send to [facility]

## 2018-03-25 NOTE — Telephone Encounter (Signed)
Good Morning [name], Can you please call [name] and share- I sent in Augmentin rx Thanks! Orpha BurKaty

## 2018-03-25 NOTE — Telephone Encounter (Signed)
Pt informed.  T. Rukaya Kleinschmidt, CMA 

## 2018-03-25 NOTE — Telephone Encounter (Signed)
Please advise.  T. Nelson, CMA 

## 2018-04-04 ENCOUNTER — Ambulatory Visit: Payer: BC Managed Care – PPO | Admitting: Gastroenterology

## 2018-04-04 ENCOUNTER — Encounter: Payer: Self-pay | Admitting: Gastroenterology

## 2018-04-04 VITALS — BP 100/62 | HR 76 | Ht 66.0 in | Wt 193.0 lb

## 2018-04-04 DIAGNOSIS — K219 Gastro-esophageal reflux disease without esophagitis: Secondary | ICD-10-CM | POA: Diagnosis not present

## 2018-04-04 DIAGNOSIS — Z1211 Encounter for screening for malignant neoplasm of colon: Secondary | ICD-10-CM

## 2018-04-04 NOTE — Progress Notes (Signed)
04/04/2018 Karen Gross [id number] 12/04/1965   HISTORY OF PRESENT ILLNESS:  This is a 52 year old female who is new to our office.  Is here today to discuss screening colonoscopy and also GERD treatment.  She has never had colonoscopy in the past.  Says that she moves her bowels well without issues.  No blood in stool.  In regards to GERD, she has been on omeprazole for years.  She is currently taking it every other day and doing well with that.  Has no symptoms as long as she takes it regularly.  If she misses 3-4 days then she starts to have symptoms again.  She has tried ranitidine in the past without relief.  No dysphagia.   Past Medical History:  Diagnosis Date  . Anxiety   . Bartholin cyst   . Hypothyroidism   . Seizures (HCC)    Stress Induced   Past Surgical History:  Procedure Laterality Date  . ABDOMINAL HYSTERECTOMY    . KNEE ARTHROSCOPY Right     reports that she has never smoked. She has never used smokeless tobacco. She reports that she drinks alcohol. She reports that she does not use drugs. family history includes Breast cancer in her mother and paternal grandmother; Heart disease in her mother. No Known Allergies    Outpatient Encounter Medications as of 04/04/2018  Medication Sig  . ARIPiprazole (ABILIFY) 10 MG tablet Take 10 mg by mouth daily.  [name] levothyroxine (SYNTHROID, LEVOTHROID) 100 MCG tablet TAKE 1 TABLET BY MOUTH DAILY BEFORE BREAKFAST.  [name] Liraglutide -Weight Management (SAXENDA) 18 MG/3ML SOPN Inject 0.6 mg into the skin daily for 7 days, THEN 1.2 mg daily for 7 days, THEN 1.8 mg daily for 7 days, THEN 2.4 mg daily for 7 days, THEN 3 mg daily for 7 days.  [name] omeprazole (PRILOSEC) 20 MG capsule Take 20 mg by mouth daily.  [name] topiramate (TOPAMAX) 100 MG tablet Take 100 mg by mouth daily.  . [DISCONTINUED] amoxicillin-clavulanate (AUGMENTIN) 875-125 MG tablet Take 1 tablet by mouth 2 (two) times daily.   No facility-administered encounter medications  on file as of 04/04/2018.      REVIEW OF SYSTEMS  : All other systems reviewed and negative except where noted in the History of Present Illness.   PHYSICAL EXAM: BP 100/62   Pulse 76   Ht 5\' 6"  (1.676 m)   Wt 193 lb (87.5 kg)   BMI 31.15 kg/m  General: Well developed white female in no acute distress Head: Normocephalic and atraumatic Eyes:  Sclerae anicteric, conjunctiva pink. Ears: Normal auditory acuity Lungs: Clear throughout to auscultation; no increased WOB. Heart: Regular rate and rhythm; no M/R/G. Abdomen: Soft, non-distended.  BS present.  Non-tender.   Rectal:  Will be done at the time of colonoscopy. Musculoskeletal: Symmetrical with no gross deformities  Skin: No lesions on visible extremities Extremities: No edema  Neurological: Alert oriented x 4, grossly non-focal Psychological:  Alert and cooperative. Normal mood and affect  ASSESSMENT AND PLAN: *Screening for CRC:  Will schedule for screening colonoscopy with Dr. Leone Payor.  The only prep she is agreeable to do is Miralax so was scheduled with Dr. Leone Payor for this reason. *GERD:  Well controlled on PPI.  Has tried ranitidine in the past without relief.  Now actually on PPI every other day and doing well.  Will continue.  Discussed starting daily calcium and vitamin D supplement with long-term PPI use.  **The risks, benefits, and alternatives  to colonoscopy were discussed with the patient and she consents to proceed.    CC:  Julaine Fusi, NP

## 2018-04-04 NOTE — Patient Instructions (Signed)
It has been recommended to you by your physician that you have a(n) colonoscopy with Dr. Leone Payor completed. Per your request, we did not schedule the procedure(s) today. Please contact our office at [phone] should you decide to have the procedure completed.

## 2018-04-08 ENCOUNTER — Other Ambulatory Visit: Payer: Self-pay | Admitting: Adult Health

## 2018-04-15 ENCOUNTER — Other Ambulatory Visit: Payer: Self-pay | Admitting: Adult Health

## 2018-04-16 ENCOUNTER — Other Ambulatory Visit: Payer: Self-pay

## 2018-04-22 ENCOUNTER — Ambulatory Visit: Payer: BC Managed Care – PPO

## 2018-04-30 ENCOUNTER — Encounter: Payer: Self-pay | Admitting: Adult Health

## 2018-04-30 ENCOUNTER — Other Ambulatory Visit: Payer: Self-pay | Admitting: Obstetrics and Gynecology

## 2018-04-30 ENCOUNTER — Ambulatory Visit: Payer: BC Managed Care – PPO | Admitting: Adult Health

## 2018-04-30 VITALS — BP 109/68 | HR 66 | Temp 98.2°F | Ht 66.0 in | Wt 193.5 lb

## 2018-04-30 DIAGNOSIS — Z1231 Encounter for screening mammogram for malignant neoplasm of breast: Secondary | ICD-10-CM

## 2018-04-30 DIAGNOSIS — M791 Myalgia, unspecified site: Secondary | ICD-10-CM

## 2018-04-30 DIAGNOSIS — R059 Cough, unspecified: Secondary | ICD-10-CM

## 2018-04-30 DIAGNOSIS — R509 Fever, unspecified: Secondary | ICD-10-CM | POA: Diagnosis not present

## 2018-04-30 DIAGNOSIS — R05 Cough: Secondary | ICD-10-CM | POA: Diagnosis not present

## 2018-04-30 DIAGNOSIS — J40 Bronchitis, not specified as acute or chronic: Secondary | ICD-10-CM | POA: Diagnosis not present

## 2018-04-30 LAB — POCT INFLUENZA A/B
INFLUENZA B, POC: NEGATIVE
Influenza A, POC: NEGATIVE

## 2018-04-30 MED ORDER — AMOXICILLIN-POT CLAVULANATE 875-125 MG PO TABS
1.0000 | ORAL_TABLET | Freq: Two times a day (BID) | ORAL | 0 refills | Status: DC
Start: 2018-04-30 — End: 2018-08-14

## 2018-04-30 MED ORDER — FLUTICASONE PROPIONATE 50 MCG/ACT NA SUSP: 2.0000 | Freq: Every day | NASAL | 6 refills | Status: DC

## 2018-04-30 MED ORDER — HYDROCOD POLST-CPM POLST ER 10-8 MG/5ML PO SUER: 5.0000 mL | Freq: Two times a day (BID) | ORAL | 0 refills | Status: DC | PRN

## 2018-04-30 NOTE — Assessment & Plan Note (Addendum)
[place] Controlled Substance Database reviewed- no aberrancies noted. Flu test Neg Please take Augmetin as directed. Please take Tussionex and Flonase as needed. Increase fluids/rest/vit c-2,000mg /day. Alternate OTC Acetaminophen and Ibuprofen for pain/fever. Work excuse provided, okay to return Monday May 06, 2018. If symptoms persist after antibiotic complete then please call clinic.

## 2018-04-30 NOTE — Progress Notes (Signed)
Subjective:    Patient ID: Karen Gross, female    DOB: 22-Mar-1966, 52 y.o.   MRN: [id number]  HPI:  Ms. Eshbach presents today with  Productive cough (yellow/mucus), clear nasal drainage, chills, frontal HA (8/10), body aches, and fatigue that has progressively worsened >2 weeks. She has been taking OTC Tylenol Cold and pushing fluids with only minimal sx relief. She reports being exposed to numerous acutely ill students the last two weeks.  Patient Care Team    Relationship Specialty Notifications Start End  William Hamburger D, NP PCP - General Family Medicine  12/27/16   Nita Sells, MD  Dermatology  12/27/16     Patient Active Problem List   Diagnosis Date Noted  . Bronchitis 04/30/2018  . Acute viral sinusitis 03/21/2018  . Special screening for malignant neoplasms, colon 09/27/2017  . Excessive cerumen in ear canal, bilateral 04/18/2017  . Gastroesophageal reflux disease 12/27/2016  . BMI 31.0-31.9,adult 12/27/2016  . Hypothyroidism 10/23/2015  . Migraine 10/23/2015     Past Medical History:  Diagnosis Date  . Anxiety   . Bartholin cyst   . Hypothyroidism   . Seizures (HCC)    Stress Induced     Past Surgical History:  Procedure Laterality Date  . ABDOMINAL HYSTERECTOMY    . KNEE ARTHROSCOPY Right      Family History  Problem Relation Age of Onset  . Breast cancer Mother   . Heart disease Mother   . Breast cancer Paternal Grandmother   . Colon cancer Neg Hx   . Stomach cancer Neg Hx      Social History   Substance and Sexual Activity  Drug Use No     Social History   Substance and Sexual Activity  Alcohol Use Yes   Comment: once a month- wine     Social History   Tobacco Use  Smoking Status Never Smoker  Smokeless Tobacco Never Used     Outpatient Encounter Medications as of 04/30/2018  Medication Sig  . ARIPiprazole (ABILIFY) 10 MG tablet TAKE 1 TABLET BY MOUTH DAILY.  . BD PEN NEEDLE NANO U/F 32G X 4 MM MISC Use as directed to  administer Saxenda  . levothyroxine (SYNTHROID, LEVOTHROID) 100 MCG tablet TAKE 1 TABLET BY MOUTH DAILY BEFORE BREAKFAST.  [name] Liraglutide -Weight Management (SAXENDA) 18 MG/3ML SOPN Use as directed  . omeprazole (PRILOSEC) 20 MG capsule Take 20 mg by mouth daily.  [name] topiramate (TOPAMAX) 100 MG tablet Take 100 mg by mouth daily.  [name] amoxicillin-clavulanate (AUGMENTIN) 875-125 MG tablet Take 1 tablet by mouth 2 (two) times daily.  . chlorpheniramine-HYDROcodone (TUSSIONEX) 10-8 MG/5ML SUER Take 5 mLs by mouth every 12 (twelve) hours as needed.  . fluticasone (FLONASE) 50 MCG/ACT nasal spray Place 2 sprays into both nostrils daily.   No facility-administered encounter medications on file as of 04/30/2018.     Allergies: Patient has no known allergies.  Body mass index is 31.23 kg/m.  Blood pressure 109/68, pulse 66, temperature 98.2 F (36.8 C), temperature source Oral, height 5\' 6"  (1.676 m), weight 193 lb 8 oz (87.8 kg), SpO2 100 %.   Review of Systems  Constitutional: Positive for activity change, appetite change, chills, diaphoresis and fatigue. Negative for fever and unexpected weight change.  HENT: Positive for congestion, postnasal drip, rhinorrhea, sinus pressure, sinus pain, sneezing, sore throat and voice change.   Eyes: Negative for visual disturbance.  Respiratory: Positive for cough and chest tightness. Negative for shortness of breath and stridor.  Cardiovascular: Negative for chest pain, palpitations and leg swelling.  Gastrointestinal: Negative for abdominal pain, blood in stool, constipation, diarrhea, nausea and vomiting.  Endocrine: Positive for cold intolerance and heat intolerance. Negative for polydipsia, polyphagia and polyuria.  Genitourinary: Negative for dysuria and flank pain.  Musculoskeletal: Positive for arthralgias and myalgias.  Neurological: Positive for headaches. Negative for dizziness.  Hematological: Does not bruise/bleed easily.   Psychiatric/Behavioral: Positive for sleep disturbance.       Objective:   Physical Exam  Constitutional: She is oriented to person, place, and time. She appears well-developed and well-nourished. She appears ill.  HENT:  Head: Normocephalic and atraumatic.  Right Ear: External ear normal. Tympanic membrane is bulging. Tympanic membrane is not erythematous. No decreased hearing is noted.  Left Ear: External ear normal. Tympanic membrane is bulging. Tympanic membrane is not erythematous. No decreased hearing is noted.  Nose: Mucosal edema and rhinorrhea present. Right sinus exhibits maxillary sinus tenderness and frontal sinus tenderness. Left sinus exhibits maxillary sinus tenderness and frontal sinus tenderness.  Mouth/Throat: Mucous membranes are normal. Normal dentition. Posterior oropharyngeal edema and posterior oropharyngeal erythema present. No oropharyngeal exudate or tonsillar abscesses. Tonsils are 0 on the right. Tonsils are 0 on the left. No tonsillar exudate.  Eyes: Pupils are equal, round, and reactive to light. Conjunctivae and EOM are normal.  Neck: Normal range of motion. Neck supple.  Cardiovascular: Normal rate, regular rhythm, normal heart sounds and intact distal pulses.  No murmur heard. Pulmonary/Chest: Effort normal and breath sounds normal. No stridor. No respiratory distress. She has no wheezes. She has no rales.  Lymphadenopathy:    She has no cervical adenopathy.  Neurological: She is alert and oriented to person, place, and time.  Skin: Skin is warm and dry. Capillary refill takes less than 2 seconds. No rash noted. No erythema. No pallor.  Psychiatric: She has a normal mood and affect. Her behavior is normal. Judgment and thought content normal.  Vitals reviewed.         Assessment & Plan:   1. Fever, unspecified fever cause   2. Cough   3. Myalgia   4. Bronchitis     Bronchitis [place] Controlled Substance Database reviewed- no  aberrancies noted. Flu test Neg Please take Augmetin as directed. Please take Tussionex and Flonase as needed. Increase fluids/rest/vit c-2,000mg /day. Alternate OTC Acetaminophen and Ibuprofen for pain/fever. Work excuse provided, okay to return Monday May 06, 2018. If symptoms persist after antibiotic complete then please call clinic.  FOLLOW-UP:  Return if symptoms worsen or fail to improve.

## 2018-04-30 NOTE — Patient Instructions (Addendum)
Acute Bronchitis, Adult Acute bronchitis is sudden (acute) swelling of the air tubes (bronchi) in the lungs. Acute bronchitis causes these tubes to fill with mucus, which can make it hard to breathe. It can also cause coughing or wheezing. In adults, acute bronchitis usually goes away within 2 weeks. A cough caused by bronchitis may last up to 3 weeks. Smoking, allergies, and asthma can make the condition worse. Repeated episodes of bronchitis may cause further lung problems, such as chronic obstructive pulmonary disease (COPD). What are the causes? This condition can be caused by germs and by substances that irritate the lungs, including:  Cold and flu viruses. This condition is most often caused by the same virus that causes a cold.  Bacteria.  Exposure to tobacco smoke, dust, fumes, and air pollution.  What increases the risk? This condition is more likely to develop in people who:  Have close contact with someone with acute bronchitis.  Are exposed to lung irritants, such as tobacco smoke, dust, fumes, and vapors.  Have a weak immune system.  Have a respiratory condition such as asthma.  What are the signs or symptoms? Symptoms of this condition include:  A cough.  Coughing up clear, yellow, or green mucus.  Wheezing.  Chest congestion.  Shortness of breath.  A fever.  Body aches.  Chills.  A sore throat.  How is this diagnosed? This condition is usually diagnosed with a physical exam. During the exam, your health care provider may order tests, such as chest X-rays, to rule out other conditions. He or she may also:  Test a sample of your mucus for bacterial infection.  Check the level of oxygen in your blood. This is done to check for pneumonia.  Do a chest X-ray or lung function testing to rule out pneumonia and other conditions.  Perform blood tests.  Your health care provider will also ask about your symptoms and medical history. How is this  treated? Most cases of acute bronchitis clear up over time without treatment. Your health care provider may recommend:  Drinking more fluids. Drinking more makes your mucus thinner, which may make it easier to breathe.  Taking a medicine for a fever or cough.  Taking an antibiotic medicine.  Using an inhaler to help improve shortness of breath and to control a cough.  Using a cool mist vaporizer or humidifier to make it easier to breathe.  Follow these instructions at home: Medicines  Take over-the-counter and prescription medicines only as told by your health care provider.  If you were prescribed an antibiotic, take it as told by your health care provider. Do not stop taking the antibiotic even if you start to feel better. General instructions  Get plenty of rest.  Drink enough fluids to keep your urine clear or pale yellow.  Avoid smoking and secondhand smoke. Exposure to cigarette smoke or irritating chemicals will make bronchitis worse. If you smoke and you need help quitting, ask your health care provider. Quitting smoking will help your lungs heal faster.  Use an inhaler, cool mist vaporizer, or humidifier as told by your health care provider.  Keep all follow-up visits as told by your health care provider. This is important. How is this prevented? To lower your risk of getting this condition again:  Wash your hands often with soap and water. If soap and water are not available, use hand sanitizer.  Avoid contact with people who have cold symptoms.  Try not to touch your hands to your   mouth, nose, or eyes.  Make sure to get the flu shot every year.  Contact a health care provider if:  Your symptoms do not improve in 2 weeks of treatment. Get help right away if:  You cough up blood.  You have chest pain.  You have severe shortness of breath.  You become dehydrated.  You faint or keep feeling like you are going to faint.  You keep vomiting.  You have a  severe headache.  Your fever or chills gets worse. This information is not intended to replace advice given to you by your health care provider. Make sure you discuss any questions you have with your health care provider. Document Released: 08/24/2004 Document Revised: 02/09/2016 Document Reviewed: 01/05/2016 Elsevier Interactive Patient Education  2018 [employer].   Sinusitis, Adult Sinusitis is soreness and inflammation of your sinuses. Sinuses are hollow spaces in the bones around your face. Your sinuses are located:  Around your eyes.  In the middle of your forehead.  Behind your nose.  In your cheekbones.  Your sinuses and nasal passages are lined with a stringy fluid (mucus). Mucus normally drains out of your sinuses. When your nasal tissues become inflamed or swollen, the mucus can become trapped or blocked so air cannot flow through your sinuses. This allows bacteria, viruses, and funguses to grow, which leads to infection. Sinusitis can develop quickly and last for 7?10 days (acute) or for more than 12 weeks (chronic). Sinusitis often develops after a cold. What are the causes? This condition is caused by anything that creates swelling in the sinuses or stops mucus from draining, including:  Allergies.  Asthma.  Bacterial or viral infection.  Abnormally shaped bones between the nasal passages.  Nasal growths that contain mucus (nasal polyps).  Narrow sinus openings.  Pollutants, such as chemicals or irritants in the air.  A foreign object stuck in the nose.  A fungal infection. This is rare.  What increases the risk? The following factors may make you more likely to develop this condition:  Having allergies or asthma.  Having had a recent cold or respiratory tract infection.  Having structural deformities or blockages in your nose or sinuses.  Having a weak immune system.  Doing a lot of swimming or diving.  Overusing nasal  sprays.  Smoking.  What are the signs or symptoms? The main symptoms of this condition are pain and a feeling of pressure around the affected sinuses. Other symptoms include:  Upper toothache.  Earache.  Headache.  Bad breath.  Decreased sense of smell and taste.  A cough that may get worse at night.  Fatigue.  Fever.  Thick drainage from your nose. The drainage is often green and it may contain pus (purulent).  Stuffy nose or congestion.  Postnasal drip. This is when extra mucus collects in the throat or back of the nose.  Swelling and warmth over the affected sinuses.  Sore throat.  Sensitivity to light.  How is this diagnosed? This condition is diagnosed based on symptoms, a medical history, and a physical exam. To find out if your condition is acute or chronic, your health care provider may:  Look in your nose for signs of nasal polyps.  Tap over the affected sinus to check for signs of infection.  View the inside of your sinuses using an imaging device that has a light attached (endoscope).  If your health care provider suspects that you have chronic sinusitis, you may also:  Be tested for allergies.  Have a sample of mucus taken from your nose (nasal culture) and checked for bacteria.  Have a mucus sample examined to see if your sinusitis is related to an allergy.  If your sinusitis does not respond to treatment and it lasts longer than 8 weeks, you may have an MRI or CT scan to check your sinuses. These scans also help to determine how severe your infection is. In rare cases, a bone biopsy may be done to rule out more serious types of fungal sinus disease. How is this treated? Treatment for sinusitis depends on the cause and whether your condition is chronic or acute. If a virus is causing your sinusitis, your symptoms will go away on their own within 10 days. You may be given medicines to relieve your symptoms, including:  Topical nasal decongestants.  They shrink swollen nasal passages and let mucus drain from your sinuses.  Antihistamines. These drugs block inflammation that is triggered by allergies. This can help to ease swelling in your nose and sinuses.  Topical nasal corticosteroids. These are nasal sprays that ease inflammation and swelling in your nose and sinuses.  Nasal saline washes. These rinses can help to get rid of thick mucus in your nose.  If your condition is caused by bacteria, you will be given an antibiotic medicine. If your condition is caused by a fungus, you will be given an antifungal medicine. Surgery may be needed to correct underlying conditions, such as narrow nasal passages. Surgery may also be needed to remove polyps. Follow these instructions at home: Medicines  Take, use, or apply over-the-counter and prescription medicines only as told by your health care provider. These may include nasal sprays.  If you were prescribed an antibiotic medicine, take it as told by your health care provider. Do not stop taking the antibiotic even if you start to feel better. Hydrate and Humidify  Drink enough water to keep your urine clear or pale yellow. Staying hydrated will help to thin your mucus.  Use a cool mist humidifier to keep the humidity level in your home above 50%.  Inhale steam for 10-15 minutes, 3-4 times a day or as told by your health care provider. You can do this in the bathroom while a hot shower is running.  Limit your exposure to cool or dry air. Rest  Rest as much as possible.  Sleep with your head raised (elevated).  Make sure to get enough sleep each night. General instructions  Apply a warm, moist washcloth to your face 3-4 times a day or as told by your health care provider. This will help with discomfort.  Wash your hands often with soap and water to reduce your exposure to viruses and other germs. If soap and water are not available, use hand sanitizer.  Do not smoke. Avoid being  around people who are smoking (secondhand smoke).  Keep all follow-up visits as told by your health care provider. This is important. Contact a health care provider if:  You have a fever.  Your symptoms get worse.  Your symptoms do not improve within 10 days. Get help right away if:  You have a severe headache.  You have persistent vomiting.  You have pain or swelling around your face or eyes.  You have vision problems.  You develop confusion.  Your neck is stiff.  You have trouble breathing. This information is not intended to replace advice given to you by your health care provider. Make sure you discuss any questions you  have with your health care provider. Document Released: 07/17/2005 Document Revised: 03/12/2016 Document Reviewed: 05/12/2015 Elsevier Interactive Patient Education  2018 [employer].  Please take Augmetin as directed. Please take Tussionex and Flonase as needed. Increase fluids/rest/vit c-2,000mg /day. Alternate OTC Acetaminophen and Ibuprofen for pain/fever. Work excuse provided, okay to return Monday May 06, 2018. If symptoms persist after antibiotic complete then please call clinic. FEEL BETTER!

## 2018-06-03 ENCOUNTER — Other Ambulatory Visit: Payer: Self-pay | Admitting: Adult Health

## 2018-06-10 ENCOUNTER — Ambulatory Visit
Admission: RE | Admit: 2018-06-10 | Discharge: 2018-06-10 | Disposition: A | Discharge status: Home / Self Care | Payer: BC Managed Care – PPO | Source: Ambulatory Visit | Attending: Obstetrics and Gynecology | Admitting: Obstetrics and Gynecology

## 2018-06-10 DIAGNOSIS — Z1231 Encounter for screening mammogram for malignant neoplasm of breast: Secondary | ICD-10-CM

## 2018-07-05 ENCOUNTER — Other Ambulatory Visit: Payer: Self-pay | Admitting: Adult Health

## 2018-08-01 ENCOUNTER — Telehealth: Payer: Self-pay | Admitting: Adult Health

## 2018-08-01 NOTE — Telephone Encounter (Signed)
Rep from Pharmaceutical  company called to see if assistance needed to extend pt's use of :    Liraglutide -Weight Management (SAXENDA) 18 MG/3ML SOPN [[id number]]   Order Details  Dose: -- Route: -- Frequency: --  Dispense Quantity: -- Refills: -- Fills remaining: --        Sig: Use as directed       Written Date: -- Expiration Date: -- Ordering Date: 04/30/18   Start Date: -- End Date: --    Source:  Received from: [facility]       Ordering Provider: -- DEA #:  -- NPI:  --   Authorizing Provider: [provider] DEA #:  -- NPI:  [id number]   Ordering User:  Stan Head, CMA        Rep left Phone# [phone] & Ref#   [id number] to   --_Forwarding message to medical assistant to call if assistance needed.  --glh

## 2018-08-05 NOTE — Telephone Encounter (Signed)
Spoke with pt who states that she is no longer using this medication.  Informed representative at [facility] of this information.  Tiajuana Amass, CMA

## 2018-08-14 ENCOUNTER — Ambulatory Visit (INDEPENDENT_AMBULATORY_CARE_PROVIDER_SITE_OTHER): Payer: BC Managed Care – PPO | Admitting: Adult Health

## 2018-08-14 ENCOUNTER — Encounter: Payer: Self-pay | Admitting: Adult Health

## 2018-08-14 VITALS — BP 103/68 | HR 67 | Temp 98.4°F | Ht 66.0 in | Wt 193.4 lb

## 2018-08-14 DIAGNOSIS — E039 Hypothyroidism, unspecified: Secondary | ICD-10-CM | POA: Diagnosis not present

## 2018-08-14 DIAGNOSIS — R079 Chest pain, unspecified: Secondary | ICD-10-CM | POA: Diagnosis not present

## 2018-08-14 DIAGNOSIS — I440 Atrioventricular block, first degree: Secondary | ICD-10-CM

## 2018-08-14 DIAGNOSIS — H6123 Impacted cerumen, bilateral: Secondary | ICD-10-CM | POA: Diagnosis not present

## 2018-08-14 MED ORDER — PREDNISONE 20 MG PO TABS: ORAL_TABLET | ORAL | 0 refills | Status: DC

## 2018-08-14 NOTE — Assessment & Plan Note (Signed)
Both ears flushed, excessive cerumen easily removed

## 2018-08-14 NOTE — Assessment & Plan Note (Signed)
Possibly costochondritis  Prednisone taper provided

## 2018-08-14 NOTE — Progress Notes (Signed)
Subjective:    Patient ID: Karen Gross, female    DOB: Dec 26, 1965, 53 y.o.   MRN: [id number]  HPI:  Ms. Toole presents two issues- 1) Excessive bil ear cerumen. Denies decrease in hearing or otalgia. Denies imbalance. 2) Mid sternal "internal soreness" that began >1 month ago. She denies acute injury/trauma prior to onset of pain Pain is intermittent rated between 4/10- 7/10 and will worsen when bending forward, first standing up, or "picking up anything". She denies chest pain with deep breathing or laying flat. She denies pain radiating to back/beck/arm She denies N/V/dyspepsia with pain She denies trouble swallowing She denies pain increasing when eating She denies pressure/squeezing/fullness of chest She denies diaphoresis or dizziness with chest pain She denies this ever occurring before She feels that her GERD has been well controlled for months Only change in activity is playing basketball "with my kids the last 3 weeks".  Of Note- She had syncopal event with loss of bladder control 06/18/18 after giving blood EMS called and she was triaged EKG- SR with 1st AV Block, VSS, BG 126 EKG today SR with 1st AV Block Only family cardiac hx- mother had several stents placed in her early 77s She has has jx of anxiety  CMP and TSH drawn today  Patient Care Team    Relationship Specialty Notifications Start End  Delanie Tirrell, Orpha Bur D, NP PCP - General Family Medicine  12/27/16   Nita Sells, MD  Dermatology  12/27/16     Patient Active Problem List   Diagnosis Date Noted  . AV block, 1st degree 08/14/2018  . Chest pain 08/14/2018  . Bronchitis 04/30/2018  . Acute viral sinusitis 03/21/2018  . Special screening for malignant neoplasms, colon 09/27/2017  . Excessive cerumen in ear canal, bilateral 04/18/2017  . Gastroesophageal reflux disease 12/27/2016  . BMI 31.0-31.9,adult 12/27/2016  . Hypothyroidism 10/23/2015  . Migraine 10/23/2015     Past Medical History:   Diagnosis Date  . Anxiety   . Bartholin cyst   . Hypothyroidism   . Seizures (HCC)    Stress Induced     Past Surgical History:  Procedure Laterality Date  . ABDOMINAL HYSTERECTOMY    . KNEE ARTHROSCOPY Right      Family History  Problem Relation Age of Onset  . Breast cancer Mother   . Heart disease Mother   . Breast cancer Paternal Grandmother   . Colon cancer Neg Hx   . Stomach cancer Neg Hx      Social History   Substance and Sexual Activity  Drug Use No     Social History   Substance and Sexual Activity  Alcohol Use Yes   Comment: once a month- wine     Social History   Tobacco Use  Smoking Status Never Smoker  Smokeless Tobacco Never Used     Outpatient Encounter Medications as of 08/14/2018  Medication Sig  . ARIPiprazole (ABILIFY) 10 MG tablet TAKE 1 TABLET BY MOUTH DAILY.  . BD PEN NEEDLE NANO U/F 32G X 4 MM MISC Use as directed to administer Saxenda  . fluticasone (FLONASE) 50 MCG/ACT nasal spray Place 2 sprays into both nostrils daily.  [name] levothyroxine (SYNTHROID, LEVOTHROID) 100 MCG tablet TAKE 1 TABLET BY MOUTH DAILY BEFORE BREAKFAST.  [name] omeprazole (PRILOSEC) 20 MG capsule Take 20 mg by mouth daily.  [name] topiramate (TOPAMAX) 100 MG tablet Take 1 tablet (100 mg total) by mouth 2 (two) times daily.  . predniSONE (DELTASONE) 20 MG tablet 1  tab every 12 hrs for 3 days, then 1 tab daily for 3 days.  . [DISCONTINUED] amoxicillin-clavulanate (AUGMENTIN) 875-125 MG tablet Take 1 tablet by mouth 2 (two) times daily.  . [DISCONTINUED] chlorpheniramine-HYDROcodone (TUSSIONEX) 10-8 MG/5ML SUER Take 5 mLs by mouth every 12 (twelve) hours as needed.  . [DISCONTINUED] Liraglutide -Weight Management (SAXENDA) 18 MG/3ML SOPN Use as directed  . [DISCONTINUED] topiramate (TOPAMAX) 100 MG tablet Take 100 mg by mouth daily.   No facility-administered encounter medications on file as of 08/14/2018.     Allergies: Patient has no known allergies.  Body mass  index is 31.22 kg/m.  Blood pressure 103/68, pulse 67, temperature 98.4 F (36.9 C), temperature source Oral, height 5\' 6"  (1.676 m), weight 193 lb 6.4 oz (87.7 kg), SpO2 100 %.     Review of Systems  Constitutional: Positive for fatigue. Negative for activity change, appetite change, chills, diaphoresis, fever and unexpected weight change.  HENT: Negative for ear discharge, ear pain and hearing loss.   Respiratory: Negative for chest tightness, shortness of breath, wheezing and stridor.   Cardiovascular: Positive for chest pain. Negative for palpitations and leg swelling.  Endocrine: Negative for cold intolerance, heat intolerance, polydipsia, polyphagia and polyuria.  Musculoskeletal: Negative for neck pain and neck stiffness.  Skin: Negative for color change, pallor, rash and wound.  Neurological: Negative for dizziness, light-headedness and headaches.  Hematological: Does not bruise/bleed easily.  Psychiatric/Behavioral: Negative for agitation, behavioral problems, confusion, decreased concentration, dysphoric mood, hallucinations, self-injury, sleep disturbance and suicidal ideas. The patient is not nervous/anxious and is not hyperactive.        Objective:   Physical Exam Exam conducted with a chaperone present.  Constitutional:      General: She is not in acute distress.    Appearance: She is not toxic-appearing or diaphoretic.  HENT:     Head: Normocephalic and atraumatic.     Right Ear: There is impacted cerumen. Tympanic membrane is not erythematous or bulging.     Left Ear: There is impacted cerumen. Tympanic membrane is not erythematous or bulging.     Ears:     Comments: Both ears flushed- cerumen easily flushed out     Mouth/Throat:     Mouth: Mucous membranes are moist.  Eyes:     Extraocular Movements: Extraocular movements intact.     Conjunctiva/sclera: Conjunctivae normal.     Pupils: Pupils are equal, round, and reactive to light.  Cardiovascular:     Rate  and Rhythm: Normal rate.     Pulses: Normal pulses.     Heart sounds: Normal heart sounds. No murmur. No friction rub. No gallop.   Pulmonary:     Effort: Pulmonary effort is normal. No respiratory distress.     Breath sounds: Normal breath sounds. No stridor. No wheezing, rhonchi or rales.  Chest:     Chest wall: No mass, lacerations, tenderness, crepitus or edema.     Comments: Sternum- not tender to touch, no ecchymosis, swelling noted  Skin:    General: Skin is warm and dry.     Capillary Refill: Capillary refill takes less than 2 seconds.  Neurological:     Mental Status: She is alert and oriented to person, place, and time.     Coordination: Coordination normal.  Psychiatric:        Mood and Affect: Mood normal.        Behavior: Behavior normal.        Thought Content: Thought content normal.  Judgment: Judgment normal.       Assessment & Plan:   1. Chest pain, unspecified type   2. AV block, 1st degree   3. Hypothyroidism, unspecified type   4. Excessive cerumen in ear canal, bilateral     Excessive cerumen in ear canal, bilateral Both ears flushed, excessive cerumen easily removed   AV block, 1st degree Stay well hydrated and take Prednisone taper as directed. 06/18/18-Syncopal event after giving blood, EMS called  EMS EKG- SR with 1st AV Block- scanned in system. EKG today- SR with 1st AV block- referral to Cardiology placed. Continue to avoid tobacco/alcohol. Eat a heart health diet. Family hx of CAD- mother, stents placed in her early 8370s  Chest pain Possibly costochondritis  Prednisone taper provided   Pt was in the office today for 25+ minutes, I spent >75% of time in face to face counseling of patient's various medical conditions and in coordination of care.  FOLLOW-UP:  Return if symptoms worsen or fail to improve.

## 2018-08-14 NOTE — Patient Instructions (Signed)
First-Degree Atrioventricular Block    What is atrioventricular block?  Atrioventricular (AV) block, also called heart block, is a problem with the system that controls how often the heart beats (heart rate) and the pattern of heart beats (heart rhythm). In this condition, the signals that travel from the heart’s upper chambers (atria) to its lower chambers (ventricles) move too slowly or are interrupted. There are several types of heart block:  · First-degree.  · Second-degree.  · Third-degree or complete.  What is first-degree heart block?  First-degree AV block is the least serious type of heart block. In this condition, the signals that control heart rate move too slowly. As a result, the heart may beat more slowly than normal. First-degree AV block can increase your risk of developing a type of irregular heartbeat called atrial fibrillation.  What are the causes?  This condition may be caused by:  · Any condition that damages the system that controls the heart’s rate and rhythm, such as a heart attack.  · Overstimulation of the nerve that slows down heart rate (vagus nerve). This cause is common among well-conditioned athletes.  · Some medicines that slow down heart rate, such as beta blockers or calcium channel blockers.  · Surgery that damages the heart.  Some people are born with this condition (congenital heart block), but most people develop it over time.  What increases the risk?  The risk for this condition increases with age. You are also more likely to develop this condition if you have:  · A history of heart attack.  · Heart failure.  · Coronary heart disease.  · Inflammation of heart muscle (myocarditis).  · Disease of heart muscle (cardiomyopathy).  · Infection of the heart valves (endocarditis).  · Infections or diseases that affect the heart. These include:  ? Lyme disease.  ? Sarcoidosis.  ? Hemochromatosis.  ? Rheumatic fever.  ? Muscle disorders including Lev disease and Lenegre  disease.  Babies are more likely to be born with heart block if:  · The mother has an autoimmune disease, such as lupus.  · The baby is born with a heart defect that affects the heart’s structure.  · A parent was born with a heart defect.  What are the signs or symptoms?  This condition usually does not cause any symptoms.  How is this diagnosed?  This condition may be diagnosed based on:  · A physical exam.  · Your medical history.  · A measurement of your pulse or heartbeat.  · Tests. These may include:  ? An electrocardiogram (ECG). This test is done to check for problems with electrical activity in the heart.  ? A Holter monitor or event monitor test. This test involves wearing a portable device that monitors your heart rate over time.  ? An electrophysiology (EP) study. This test involves having long, thin tubes (catheters) placed in the heart. This test records electrical signals in the heart.  How is this treated?  Usually, treatment is not needed for this condition. In some cases, treatment involves:  · Treating an underlying condition, such as heart disease.  · Changing or stopping any heart medicines that can cause heart block.  Follow these instructions at home:    · Take over-the-counter and prescription medicines only as told by your health care provider.  · Work with your health care provider to control lifestyle choices that increase your risk for heart disease. You may need to:  ? Get regular exercise. Each week,   proteins like poultry and eggs. Your health care provider or diet and nutrition specialist (dietitian) can help you make healthy choices. ? Maintain a healthy weight. ? Limit  alcohol intake to no more than 1 drink per day for nonpregnant women and 2 drinks per day for men. One drink equals 12 oz of beer, 5 oz of wine, or 1 oz of hard liquor.  Do not use any products that contain nicotine or tobacco, such as cigarettes and e-cigarettes. If you need help quitting, ask your health care provider.  Keep all follow-up visits as told by your health care provider. This is important. Contact a health care provider if:  You feel like your heart is skipping beats.  You feel more tired than normal.  You have swelling in your hands, feet, or lower legs. Get help right away if:  Your symptoms change or they get worse.  You develop new symptoms.  You have chest pain, especially if the pain: ? Feels like crushing or pressure. ? Spreads to your arms, back, neck, or jaw.  You feel short of breath.  You feel light-headed or weak.  You faint. Summary  First-degree AV block is the least serious type of heart block. In this condition, the signals that control heart rate move too slowly. As a result, the heart may beat more slowly than normal.  Usually, treatment is not needed for this condition. In some cases, you may need to change or stop medications that may be making the condition worse.  Healthy lifestyle choices such as exercising regularly, eating a healthy diet, and limiting alcohol are good for your heart. This information is not intended to replace advice given to you by your health care provider. Make sure you discuss any questions you have with your health care provider. Document Released: 06/29/2008 Document Revised: 03/03/2016 Document Reviewed: 03/03/2016 Elsevier Interactive Patient Education  2019 [employer]   Costochondritis  Costochondritis is swelling and irritation (inflammation) of the tissue (cartilage) that connects your ribs to your breastbone (sternum). This causes pain in the front of your chest. The pain usually starts gradually and  involves more than one rib. What are the causes? The exact cause of this condition is not always known. It results from stress on the cartilage where your ribs attach to your sternum. The cause of this stress could be:  Chest injury (trauma).  Exercise or activity, such as lifting.  Severe coughing. What increases the risk? You may be at higher risk for this condition if you:  Are female.  Are 39?51 years old.  Recently started a new exercise or work activity.  Have low levels of vitamin D.  Have a condition that makes you cough frequently. What are the signs or symptoms? The main symptom of this condition is chest pain. The pain:  Usually starts gradually and can be sharp or dull.  Gets worse with deep breathing, coughing, or exercise.  Gets better with rest.  May be worse when you press on the sternum-rib connection (tenderness). How is this diagnosed? This condition is diagnosed based on your symptoms, medical history, and a physical exam. Your health care provider will check for tenderness when pressing on your sternum. This is the most important finding. You may also have tests to rule out other causes of chest pain. These may include:  A chest X-ray to check for lung problems.  An electrocardiogram (ECG) to see if you have a heart problem that could be causing the pain.  An imaging scan to rule out a chest or rib fracture. How is this treated? This condition usually goes away on its own over time. Your health care provider may prescribe an NSAID to reduce pain and inflammation. Your health care provider may also suggest that you:  Rest and avoid activities that make pain worse.  Apply heat or cold to the area to reduce pain and inflammation.  Do exercises to stretch your chest muscles. If these treatments do not help, your health care provider may inject a numbing medicine at the sternum-rib connection to help relieve the pain. Follow these instructions at  home:  Avoid activities that make pain worse. This includes any activities that use chest, abdominal, and side muscles.  If directed, put ice on the painful area: ? Put ice in a plastic bag. ? Place a towel between your skin and the bag. ? Leave the ice on for 20 minutes, 2-3 times a day.  If directed, apply heat to the affected area as often as told by your health care provider. Use the heat source that your health care provider recommends, such as a moist heat pack or a heating pad. ? Place a towel between your skin and the heat source. ? Leave the heat on for 20-30 minutes. ? Remove the heat if your skin turns bright red. This is especially important if you are unable to feel pain, heat, or cold. You may have a greater risk of getting burned.  Take over-the-counter and prescription medicines only as told by your health care provider.  Return to your normal activities as told by your health care provider. Ask your health care provider what activities are safe for you.  Keep all follow-up visits as told by your health care provider. This is important. Contact a health care provider if:  You have chills or a fever.  Your pain does not go away or it gets worse.  You have a cough that does not go away (is persistent). Get help right away if:  You have shortness of breath. This information is not intended to replace advice given to you by your health care provider. Make sure you discuss any questions you have with your health care provider. Document Released: 04/26/2005 Document Revised: 04/18/2017 Document Reviewed: 11/10/2015 Elsevier Interactive Patient Education  2019 [employer]  Stay well hydrated and take Prednisone taper as directed. Thank you for sharing the 06/18/18 EMS EKG- SR with 1st AV Block- scanned in system. EKG today- SR with 1st AV block- referral to Cardiology placed. Continue to avoid tobacco/alcohol. Eat a heart health diet. Please call clinic with  questions/concerns. We will call you when lab results are available. FEEL BETTER!

## 2018-08-14 NOTE — Assessment & Plan Note (Signed)
Stay well hydrated and take Prednisone taper as directed. 06/18/18-Syncopal event after giving blood, EMS called  EMS EKG- SR with 1st AV Block- scanned in system. EKG today- SR with 1st AV block- referral to Cardiology placed. Continue to avoid tobacco/alcohol. Eat a heart health diet. Family hx of CAD- mother, stents placed in her early 70s

## 2018-08-15 LAB — COMPREHENSIVE METABOLIC PANEL
A/G RATIO: 1.9 (ref 1.2–2.2)
ALBUMIN: 4.3 g/dL (ref 3.5–5.5)
ALK PHOS: 112 IU/L (ref 39–117)
ALT: 18 IU/L (ref 0–32)
AST: 19 IU/L (ref 0–40)
BILIRUBIN TOTAL: 0.2 mg/dL (ref 0.0–1.2)
BUN / CREAT RATIO: 15 (ref 9–23)
BUN: 13 mg/dL (ref 6–24)
CHLORIDE: 106 mmol/L (ref 96–106)
CO2: 22 mmol/L (ref 20–29)
Calcium: 9.3 mg/dL (ref 8.7–10.2)
Creatinine, Ser: 0.87 mg/dL (ref 0.57–1.00)
GFR calc non Af Amer: 78 mL/min/{1.73_m2} (ref >59)
GFR, EST AFRICAN AMERICAN: 90 mL/min/{1.73_m2} (ref >59)
GLUCOSE: 76 mg/dL (ref 65–99)
Globulin, Total: 2.3 g/dL (ref 1.5–4.5)
POTASSIUM: 4.4 mmol/L (ref 3.5–5.2)
Sodium: 143 mmol/L (ref 134–144)
TOTAL PROTEIN: 6.6 g/dL (ref 6.0–8.5)

## 2018-08-15 LAB — TSH: TSH: 3.9 u[IU]/mL (ref 0.450–4.500)

## 2018-08-23 ENCOUNTER — Ambulatory Visit: Payer: BC Managed Care – PPO | Admitting: Cardiology

## 2018-09-09 NOTE — Progress Notes (Signed)
Subjective:    Patient ID: Karen Gross, female    DOB: 1966/06/09, 53 y.o.   MRN: [id number]  HPI: 09/12/2018 OV: Karen Gross is here for 6 month f/u: Migraine, Obesity, Depression, hypothyroidism She reports mood stable, denies thoughts of harming herself/others She denies increase in sadness or hopelessness She reports being on Abilify 10mg  for "maybe over five years ago" She was previously trialed on several on SSRIs that did not control her depression and eventually Abilify was found to be the most successful to control depression and stabilize overall mood She denies every being told she was Bipolar. She is also on Topiramate 100mg  BID for seizure control, denies any seizure activity in last 5 years. She continues to struggle with weight- continues to walk 1 mile "during 3rd block" but no additional exercise above that. 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 Discussed that she could walk or perform body weight exercises while she watches her children play basketball, baseball, softball Her diet is excellent- primarily fruits/vegetables/lean proteins She continues to abstain from tobacco/vape/ETOH use She denies current CP/dyspnea/dizziness/palpiations Upcoming cards appt 10/25/2018  Patient Care Team    Relationship Specialty Notifications Start End  William Hamburger D, NP PCP - General Family Medicine  12/27/16   Nita Sells, MD  Dermatology  12/27/16     Patient Active Problem List   Diagnosis Date Noted  . Depression, recurrent (HCC) 09/13/2018  . Hx of partial seizures 09/13/2018  . Healthcare maintenance 09/12/2018  . AV block, 1st degree 08/14/2018  . Chest pain 08/14/2018  . Bronchitis 04/30/2018  . Acute viral sinusitis 03/21/2018  . Special screening for malignant  neoplasms, colon 09/27/2017  . Excessive cerumen in ear canal, bilateral 04/18/2017  . Gastroesophageal reflux disease 12/27/2016  . BMI 31.0-31.9,adult 12/27/2016  . Hypothyroidism 10/23/2015  . Migraine 10/23/2015     Past Medical History:  Diagnosis Date  . Anxiety   . Bartholin cyst   . Hypothyroidism   . Seizures (HCC)    Stress Induced     Past Surgical History:  Procedure Laterality Date  . ABDOMINAL HYSTERECTOMY    . KNEE ARTHROSCOPY Right      Family History  Problem Relation Age of Onset  . Breast cancer Mother   . Heart disease Mother   . Breast cancer Paternal Grandmother   . Colon cancer Neg Hx   . Stomach cancer Neg Hx      Social History   Substance and Sexual Activity  Drug Use No     Social History   Substance and Sexual Activity  Alcohol Use Yes   Comment: once a month- wine     Social History   Tobacco Use  Smoking Status Never Smoker  Smokeless Tobacco Never Used     Outpatient Encounter Medications as of 09/12/2018  Medication Sig  . ARIPiprazole (ABILIFY) 10 MG tablet TAKE 1 TABLET BY MOUTH DAILY.  [name] levothyroxine (SYNTHROID, LEVOTHROID) 100 MCG tablet TAKE 1 TABLET BY MOUTH DAILY BEFORE BREAKFAST.  [name] omeprazole (PRILOSEC) 20 MG capsule Take 20 mg by mouth daily.  . predniSONE (DELTASONE) 20 MG tablet 1 tab every 12 hrs for 3 days, then 1 tab daily for 3 days.  [name] topiramate (TOPAMAX) 100 MG tablet Take 1 tablet (100 mg total) by mouth 2 (two) times daily.  . [  DISCONTINUED] levothyroxine (SYNTHROID, LEVOTHROID) 100 MCG tablet TAKE 1 TABLET BY MOUTH DAILY BEFORE BREAKFAST.  . [DISCONTINUED] BD PEN NEEDLE NANO U/F 32G X 4 MM MISC Use as directed to administer Saxenda  . [DISCONTINUED] fluticasone (FLONASE) 50 MCG/ACT nasal spray Place 2 sprays into both nostrils daily.   No facility-administered encounter medications on file as of 09/12/2018.     Allergies: Patient has no known allergies.  Body mass index is 32.07  kg/m.  Blood pressure 99/63, pulse 68, temperature 98.6 F (37 C), temperature source Oral, height 5\' 6"  (1.676 m), weight 198 lb 11.2 oz (90.1 kg), SpO2 97 %.   Review of Systems  Constitutional: Positive for fatigue. Negative for activity change, appetite change, chills, diaphoresis, fever and unexpected weight change.  HENT: Negative for congestion.   Eyes: Negative for visual disturbance.  Respiratory: Negative for cough, chest tightness, shortness of breath, wheezing and stridor.   Cardiovascular: Negative for chest pain, palpitations and leg swelling.  Gastrointestinal: Negative for abdominal distention, abdominal pain, blood in stool, constipation, diarrhea, nausea, rectal pain and vomiting.  Endocrine: Negative for cold intolerance, heat intolerance, polydipsia, polyphagia and polyuria.  Genitourinary: Negative for difficulty urinating and flank pain.  Musculoskeletal: Negative for arthralgias, gait problem, joint swelling and myalgias.  Neurological: Negative for dizziness, seizures and headaches.  Hematological: Does not bruise/bleed easily.  Psychiatric/Behavioral: Negative for behavioral problems, confusion, decreased concentration, dysphoric mood, hallucinations, self-injury, sleep disturbance and suicidal ideas. The patient is not nervous/anxious and is not hyperactive.        Objective:   Physical Exam Vitals signs and nursing note reviewed.  Constitutional:      General: She is not in acute distress.    Appearance: She is well-developed. She is not diaphoretic.  HENT:     Head: Normocephalic and atraumatic.     Right Ear: External ear normal.     Left Ear: External ear normal.     Nose: Nose normal.  Eyes:     Extraocular Movements: Extraocular movements intact.     Conjunctiva/sclera: Conjunctivae normal.     Pupils: Pupils are equal, round, and reactive to light.  Cardiovascular:     Rate and Rhythm: Normal rate and regular rhythm.     Heart sounds: Normal  heart sounds. No murmur.  Pulmonary:     Effort: Pulmonary effort is normal. No respiratory distress.     Breath sounds: Normal breath sounds. No stridor. No wheezing or rales.  Chest:     Chest wall: No tenderness.  Skin:    General: Skin is warm and dry.     Capillary Refill: Capillary refill takes less than 2 seconds.     Coloration: Skin is not pale.     Findings: No erythema or rash.  Neurological:     Mental Status: She is alert and oriented to person, place, and time.  Psychiatric:        Behavior: Behavior normal.        Thought Content: Thought content normal.        Judgment: Judgment normal.       Assessment & Plan:   1. Hypothyroidism, unspecified type   2. Healthcare maintenance   3. AV block, 1st degree   4. Depression, recurrent (HCC)   5. Hx of partial seizures     Healthcare maintenance Continue all medications as directed.   Follow Mediterranean diet Increase regular exercise.  Recommend at least 30 minutes daily, 5 days per week of walking, jogging, biking,  swimming, YouTube/Pinterest workout videos. Please keep you cardiology appr next month. Please schedule complete physical with fasting labs in 3 months.  Hypothyroidism 08/14/2018 TSH-WNL Continue Levothyroxine 100mcg QD  AV block, 1st degree Cards appt next month Denies current cardiac sx's   Depression, recurrent (HCC) She reports mood stable, denies thoughts of harming herself/others She denies increase in sadness or hopelessness She reports being on Abilify 10mg  for "maybe over five years ago" She was previously trialed on several on SSRIs that did not control her depression and eventually Abilify was found to be the most successful to control depression and stabilize overall mood She denies every being told she was Bipolar.   Hx of partial seizures She is also on Topiramate 100mg  BID for seizure control, denies any seizure activity in last 5 years.    FOLLOW-UP:  Return in about 3  months (around 12/11/2018) for CPE, Fasting Labs.

## 2018-09-12 ENCOUNTER — Ambulatory Visit (INDEPENDENT_AMBULATORY_CARE_PROVIDER_SITE_OTHER): Payer: BC Managed Care – PPO | Admitting: Adult Health

## 2018-09-12 ENCOUNTER — Encounter: Payer: Self-pay | Admitting: Adult Health

## 2018-09-12 VITALS — BP 99/63 | HR 68 | Temp 98.6°F | Ht 66.0 in | Wt 198.7 lb

## 2018-09-12 DIAGNOSIS — Z0000 Encounter for general adult medical examination without abnormal findings: Secondary | ICD-10-CM | POA: Diagnosis not present

## 2018-09-12 DIAGNOSIS — I440 Atrioventricular block, first degree: Secondary | ICD-10-CM

## 2018-09-12 DIAGNOSIS — E039 Hypothyroidism, unspecified: Secondary | ICD-10-CM | POA: Diagnosis not present

## 2018-09-12 DIAGNOSIS — F339 Major depressive disorder, recurrent, unspecified: Secondary | ICD-10-CM

## 2018-09-12 DIAGNOSIS — Z8669 Personal history of other diseases of the nervous system and sense organs: Secondary | ICD-10-CM

## 2018-09-12 MED ORDER — LEVOTHYROXINE SODIUM 100 MCG PO TABS
ORAL_TABLET | ORAL | 2 refills | Status: DC
Start: 2018-09-12 — End: 2019-03-12

## 2018-09-12 NOTE — Patient Instructions (Addendum)
Mediterranean Diet A Mediterranean diet refers to food and lifestyle choices that are based on the traditions of countries located on the [employer]. This way of eating has been shown to help prevent certain conditions and improve outcomes for people who have chronic diseases, like kidney disease and heart disease. What are tips for following this plan? Lifestyle  Cook and eat meals together with your family, when possible.  Drink enough fluid to keep your urine clear or pale yellow.  Be physically active every day. This includes: ? Aerobic exercise like running or swimming. ? Leisure activities like gardening, walking, or housework.  Get 7-8 hours of sleep each night.  If recommended by your health care provider, drink red wine in moderation. This means 1 glass a day for nonpregnant women and 2 glasses a day for men. A glass of wine equals 5 oz (150 mL). Reading food labels   Check the serving size of packaged foods. For foods such as rice and pasta, the serving size refers to the amount of cooked product, not dry.  Check the total fat in packaged foods. Avoid foods that have saturated fat or trans fats.  Check the ingredients list for added sugars, such as corn syrup. Shopping  At the grocery store, buy most of your food from the areas near the walls of the store. This includes: ? Fresh fruits and vegetables (produce). ? Grains, beans, nuts, and seeds. Some of these may be available in unpackaged forms or large amounts (in bulk). ? Fresh seafood. ? Poultry and eggs. ? Low-fat dairy products.  Buy whole ingredients instead of prepackaged foods.  Buy fresh fruits and vegetables in-season from local farmers markets.  Buy frozen fruits and vegetables in resealable bags.  If you do not have access to quality fresh seafood, buy precooked frozen shrimp or canned fish, such as tuna, salmon, or sardines.  Buy small amounts of raw or cooked vegetables, salads, or olives from  the deli or salad bar at your store.  Stock your pantry so you always have certain foods on hand, such as olive oil, canned tuna, canned tomatoes, rice, pasta, and beans. Cooking  Cook foods with extra-virgin olive oil instead of using butter or other vegetable oils.  Have meat as a side dish, and have vegetables or grains as your main dish. This means having meat in small portions or adding small amounts of meat to foods like pasta or stew.  Use beans or vegetables instead of meat in common dishes like chili or lasagna.  Experiment with different cooking methods. Try roasting or broiling vegetables instead of steaming or sauteing them.  Add frozen vegetables to soups, stews, pasta, or rice.  Add nuts or seeds for added healthy fat at each meal. You can add these to yogurt, salads, or vegetable dishes.  Marinate fish or vegetables using olive oil, lemon juice, garlic, and fresh herbs. Meal planning   Plan to eat 1 vegetarian meal one day each week. Try to work up to 2 vegetarian meals, if possible.  Eat seafood 2 or more times a week.  Have healthy snacks readily available, such as: ? Vegetable sticks with hummus. ? [place] yogurt. ? Fruit and nut trail mix.  Eat balanced meals throughout the week. This includes: ? Fruit: 2-3 servings a day ? Vegetables: 4-5 servings a day ? Low-fat dairy: 2 servings a day ? Fish, poultry, or lean meat: 1 serving a day ? Beans and legumes: 2 or more servings a week ?  Nuts and seeds: 1-2 servings a day ? Whole grains: 6-8 servings a day ? Extra-virgin olive oil: 3-4 servings a day  Limit red meat and sweets to only a few servings a month What are my food choices?  Mediterranean diet ? Recommended ? Grains: Whole-grain pasta. Brown rice. Bulgar wheat. Polenta. Couscous. Whole-wheat bread. Orpah Cobb. ? Vegetables: Artichokes. Beets. Broccoli. Cabbage. Carrots. Eggplant. Green beans. Chard. Kale. Spinach. Onions. Leeks. Peas. Squash.  Tomatoes. Peppers. Radishes. ? Fruits: Apples. Apricots. Avocado. Berries. Bananas. Cherries. Dates. Figs. Grapes. Lemons. Melon. Oranges. Peaches. Plums. Pomegranate. ? Meats and other protein foods: Beans. Almonds. Sunflower seeds. Pine nuts. Peanuts. Cod. Salmon. Scallops. Shrimp. Tuna. Tilapia. Clams. Oysters. Eggs. ? Dairy: Low-fat milk. Cheese. Greek yogurt. ? Beverages: Water. Red wine. Herbal tea. ? Fats and oils: Extra virgin olive oil. Avocado oil. Grape seed oil. ? Sweets and desserts: [place] yogurt with honey. Baked apples. Poached pears. Trail mix. ? Seasoning and other foods: Basil. Cilantro. Coriander. Cumin. Mint. Parsley. Sage. Rosemary. Tarragon. Garlic. Oregano. Thyme. Pepper. Balsalmic vinegar. Tahini. Hummus. Tomato sauce. Olives. Mushrooms. ? Limit these ? Grains: Prepackaged pasta or rice dishes. Prepackaged cereal with added sugar. ? Vegetables: Deep fried potatoes (french fries). ? Fruits: Fruit canned in syrup. ? Meats and other protein foods: Beef. Pork. Lamb. Poultry with skin. Hot dogs. Tomasa Blase. ? Dairy: Ice cream. Sour cream. Whole milk. ? Beverages: Juice. Sugar-sweetened soft drinks. Beer. Liquor and spirits. ? Fats and oils: Butter. Canola oil. Vegetable oil. Beef fat (tallow). Lard. ? Sweets and desserts: Cookies. Cakes. Pies. Candy. ? Seasoning and other foods: Mayonnaise. Premade sauces and marinades. ? The items listed may not be a complete list. Talk with your dietitian about what dietary choices are right for you. Summary  The Mediterranean diet includes both food and lifestyle choices.  Eat a variety of fresh fruits and vegetables, beans, nuts, seeds, and whole grains.  Limit the amount of red meat and sweets that you eat.  Talk with your health care provider about whether it is safe for you to drink red wine in moderation. This means 1 glass a day for nonpregnant women and 2 glasses a day for men. A glass of wine equals 5 oz (150 mL). This information  is not intended to replace advice given to you by your health care provider. Make sure you discuss any questions you have with your health care provider. Document Released: 03/09/2016 Document Revised: 04/11/2016 Document Reviewed: 03/09/2016 Elsevier Interactive Patient Education  2019 [employer].  Continue all medications as directed.   Follow Mediterranean diet Increase regular exercise.  Recommend at least 30 minutes daily, 5 days per week of walking, jogging, biking, swimming, YouTube/Pinterest workout videos. Please keep you cardiology appr next month. Please schedule complete physical with fasting labs in 3 months. GREAT TO SEE YOU!

## 2018-09-12 NOTE — Assessment & Plan Note (Addendum)
Continue all medications as directed.   Follow Mediterranean diet Increase regular exercise.  Recommend at least 30 minutes daily, 5 days per week of walking, jogging, biking, swimming, YouTube/Pinterest workout videos. Please keep you cardiology appr next month. Please schedule complete physical with fasting labs in 3 months.

## 2018-09-13 DIAGNOSIS — F339 Major depressive disorder, recurrent, unspecified: Secondary | ICD-10-CM | POA: Insufficient documentation

## 2018-09-13 DIAGNOSIS — Z8669 Personal history of other diseases of the nervous system and sense organs: Secondary | ICD-10-CM | POA: Insufficient documentation

## 2018-09-13 NOTE — Assessment & Plan Note (Signed)
08/14/2018 TSH-WNL Continue Levothyroxine QD

## 2018-09-13 NOTE — Assessment & Plan Note (Signed)
She reports mood stable, denies thoughts of harming herself/others She denies increase in sadness or hopelessness She reports being on Abilify 10mg  for "maybe over five years ago" She was previously trialed on several on SSRIs that did not control her depression and eventually Abilify was found to be the most successful to control depression and stabilize overall mood She denies every being told she was Bipolar.

## 2018-09-13 NOTE — Assessment & Plan Note (Signed)
Cards appt next month Denies current cardiac sx's

## 2018-09-13 NOTE — Assessment & Plan Note (Signed)
She is also on Topiramate 100mg  BID for seizure control, denies any seizure activity in last 5 years.

## 2018-09-20 ENCOUNTER — Other Ambulatory Visit: Payer: Self-pay | Admitting: Adult Health

## 2018-10-17 ENCOUNTER — Other Ambulatory Visit: Payer: Self-pay | Admitting: Adult Health

## 2018-10-24 ENCOUNTER — Telehealth: Payer: Self-pay | Admitting: *Deleted

## 2018-10-24 NOTE — Telephone Encounter (Signed)
   Primary Cardiologist: No primary care provider on file.   Pt contacted.  History and symptoms reviewed.  Pt will f/u with [facility] provider as scheduled.  Pt. advised that we are restricting visitors at this time and request that only patients present for check-in prior to their appointment.  All other visitors should remain in their car.  If necessary, only one visitor may come with the patient, into the building. For everyone's safety, all patients and visitor entering our practice area should expect to be screened again prior to entering our waiting area.  Deliah Goody, RN  10/24/2018 2:06 PM

## 2018-10-25 ENCOUNTER — Telehealth: Payer: Self-pay

## 2018-10-25 ENCOUNTER — Encounter: Payer: Self-pay | Admitting: Cardiovascular Disease

## 2018-10-25 ENCOUNTER — Ambulatory Visit: Payer: BC Managed Care – PPO | Admitting: Cardiovascular Disease

## 2018-10-25 ENCOUNTER — Other Ambulatory Visit: Payer: Self-pay

## 2018-10-25 VITALS — BP 108/77 | HR 62 | Ht 67.0 in | Wt 197.4 lb

## 2018-10-25 DIAGNOSIS — R0789 Other chest pain: Secondary | ICD-10-CM

## 2018-10-25 NOTE — Assessment & Plan Note (Signed)
Karen Gross was referred to me by William Hamburger NP for atypical chest pain.  She has no cardiac risk factors other than family history with mother who had stents in her 70s.  She is never had a heart attack or stroke.  She developed musculoskeletal chest pain 20 half months ago that occurs when pressure is applied to the lower sternum or when she moves in certain positions.  It is localized, is not effort related and does not radiate.  I have a low suspicion that this is ischemically mediated.  I am going to get a coronary calcium score to further evaluate.

## 2018-10-25 NOTE — Patient Instructions (Signed)
Medication Instructions:  Your physician recommends that you continue on your current medications as directed. Please refer to the Current Medication list given to you today.  If you need a refill on your cardiac medications before your next appointment, please call your pharmacy.   Lab work: NONE If you have labs (blood work) drawn today and your tests are completely normal, you will receive your results only by: [name] MyChart Message (if you have MyChart) OR . A paper copy in the mail If you have any lab test that is abnormal or we need to change your treatment, we will call you to review the results.  Testing/Procedures: Your physician recommends the following:    Coronary Calcium Scan A coronary calcium scan is an imaging test used to look for deposits of calcium and other fatty materials (plaques) in the inner lining of the blood vessels of the heart (coronary arteries). These deposits of calcium and plaques can partly clog and narrow the coronary arteries without producing any symptoms or warning signs. This puts a person at risk for a heart attack. This test can detect these deposits before symptoms develop. Tell a health care provider about:  Any allergies you have.  All medicines you are taking, including vitamins, herbs, eye drops, creams, and over-the-counter medicines.  Any problems you or family members have had with anesthetic medicines.  Any blood disorders you have.  Any surgeries you have had.  Any medical conditions you have.  Whether you are pregnant or may be pregnant. What are the risks? Generally, this is a safe procedure. However, problems may occur, including:  Harm to a pregnant woman and her unborn baby. This test involves the use of radiation. Radiation exposure can be dangerous to a pregnant woman and her unborn baby. If you are pregnant, you generally should not have this procedure done.  Slight increase in the risk of cancer. This is because of the  radiation involved in the test. What happens before the procedure? No preparation is needed for this procedure. What happens during the procedure?   You will undress and remove any jewelry around your neck or chest.  You will put on a hospital gown.  Sticky electrodes will be placed on your chest. The electrodes will be connected to an electrocardiogram (ECG) machine to record a tracing of the electrical activity of your heart.  A CT scanner will take pictures of your heart. During this time, you will be asked to lie still and hold your breath for 2-3 seconds while a picture of your heart is being taken. The procedure may vary among health care providers and hospitals. What happens after the procedure?  You can get dressed.  You can return to your normal activities.  It is up to you to get the results of your test. Ask your health care provider, or the department that is doing the test, when your results will be ready. Summary  A coronary calcium scan is an imaging test used to look for deposits of calcium and other fatty materials (plaques) in the inner lining of the blood vessels of the heart (coronary arteries).  Generally, this is a safe procedure. Tell your health care provider if you are pregnant or may be pregnant.  No preparation is needed for this procedure.  A CT scanner will take pictures of your heart.  You can return to your normal activities after the scan is done. This information is not intended to replace advice given to you by your  health care provider. Make sure you discuss any questions you have with your health care provider. Document Released: 01/13/2008 Document Revised: 06/05/2016 Document Reviewed: 06/05/2016 Elsevier Interactive Patient Education  2019 [employer].   Follow-Up: At [facility], you and your health needs are our priority.  As part of our continuing mission to provide you with exceptional heart care, we have created designated  Provider Care Teams.  These Care Teams include your primary Cardiologist (physician) and Advanced Practice Providers (APPs -  Physician Assistants and Nurse Practitioners) who all work together to provide you with the care you need, when you need it. . You may schedule a follow up appointment AS NEEDED PENDING THE RESULTS OF YOUR CORONARY CALCIUM SCAN. You may see Dr. Allyson Sabal or one of the following Advanced Practice Providers on your designated Care Team:   . Corine Shelter, [place] . Azalee Course, PA-C . Micah Flesher, PA-C . Joni Reining, DNP . Theodore Demark, PA-C . Judy Pimple, PA-C . Marjie Skiff, PA-C

## 2018-10-25 NOTE — Progress Notes (Signed)
10/25/2018 Karen Gross   [birth year]  [id number]  Primary Physician Karen Gross, Karen Blossom, NP Primary Cardiologist: Karen Gess MD Karen Gross, [place], [place]  HPI:  Karen Gross is a 53 y.o. mild to moderately overweight married Caucasian female accompanied by her partner Karen Gross today.  They have 2 children between them.  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  She was referred by Karen Hamburger NP for evaluation of atypical chest pain.  She has no cardiac risk factors other than a mother who had stents in her 62s.  She is never had a heart attack or stroke.  She does have a history of seizure disorders was hot happens quite infrequently.  She developed musculoskeletal skeletal type chest pain 2-1/2 months ago.  Is reproduced with pressure on her lower sternum and with certain positions although it is not effort related.  There are no other associated symptoms.   Current Meds  Medication Sig  . ARIPiprazole (ABILIFY) 10 MG tablet TAKE 1 TABLET BY MOUTH DAILY.  [name] levothyroxine (SYNTHROID, LEVOTHROID) 100 MCG tablet TAKE 1 TABLET BY MOUTH DAILY BEFORE BREAKFAST.  [name] omeprazole (PRILOSEC) 20 MG capsule Take 20 mg by mouth daily.  [name] topiramate (TOPAMAX) 100 MG tablet TAKE 1 TABLET BY MOUTH 2 TIMES DAILY.     No Known Allergies  Social History   Socioeconomic History  . Marital status: Single    Spouse name: Not on file  . Number of children: Not on file  . Years of education: Not on file  . Highest education level: Not on file  Occupational History  . Not on file  Social Needs  . Financial resource strain: Not on file  . Food insecurity:    Worry: Not on file    Inability: Not on file  . Transportation needs:    Medical: Not on file    Non-medical: Not on file  Tobacco Use  . Smoking status: Never Smoker  . Smokeless tobacco: Never Used  Substance and Sexual Activity  . Alcohol use: Yes    Comment: once a month- wine  . Drug use: No  . Sexual activity: Yes   Birth control/protection: None  Lifestyle  . Physical activity:    Days per week: Not on file    Minutes per session: Not on file  . Stress: Not on file  Relationships  . Social connections:    Talks on phone: Not on file    Gets together: Not on file    Attends religious service: Not on file    Active member of club or organization: Not on file    Attends meetings of clubs or organizations: Not on file    Relationship status: Not on file  . Intimate partner violence:    Fear of current or ex partner: Not on file    Emotionally abused: Not on file    Physically abused: Not on file    Forced sexual activity: Not on file  Other Topics Concern  . Not on file  Social History Narrative  . Not on file     Review of Systems: General: negative for chills, fever, night sweats or weight changes.  Cardiovascular: negative for chest pain, dyspnea on exertion, edema, orthopnea, palpitations, paroxysmal nocturnal dyspnea or shortness of breath Dermatological: negative for rash Respiratory: negative for cough or wheezing Urologic: negative for hematuria Abdominal: negative for nausea, vomiting, diarrhea, bright red blood per rectum, melena, or hematemesis Neurologic: negative  for visual changes, syncope, or dizziness All other systems reviewed and are otherwise negative except as noted above.    Pulse 62, height 5\' 7"  (1.702 m), weight 197 lb 6.4 oz (89.5 kg).  General appearance: alert and no distress Neck: no adenopathy, no carotid bruit, no JVD, supple, symmetrical, trachea midline and thyroid not enlarged, symmetric, no tenderness/mass/nodules Lungs: clear to auscultation bilaterally Heart: regular rate and rhythm, S1, S2 normal, no murmur, click, rub or gallop Extremities: extremities normal, atraumatic, no cyanosis or edema Pulses: 2+ and symmetric Skin: Skin color, texture, turgor normal. No rashes or lesions Neurologic: Alert and oriented X 3, normal strength and tone. Normal  symmetric reflexes. Normal coordination and gait  EKG normal sinus rhythm at 62 without ST or T wave changes.  I personally reviewed this EKG.  ASSESSMENT AND PLAN:   Chest pain Karen Gross was referred to me by Karen Hamburger NP for atypical chest pain.  She has no cardiac risk factors other than family history with mother who had stents in her 20s.  She is never had a heart attack or stroke.  She developed musculoskeletal chest pain 20 half months ago that occurs when pressure is applied to the lower sternum or when she moves in certain positions.  It is localized, is not effort related and does not radiate.  I have a low suspicion that this is ischemically mediated.  I am going to get a coronary calcium score to further evaluate.      Karen Gess MD FACP,FACC,FAHA, [facility] 10/25/2018 12:03 PM

## 2019-01-06 ENCOUNTER — Telehealth: Payer: Self-pay | Admitting: *Deleted

## 2019-01-06 ENCOUNTER — Other Ambulatory Visit: Payer: Self-pay | Admitting: Adult Health

## 2019-01-06 NOTE — Telephone Encounter (Signed)

## 2019-01-07 ENCOUNTER — Telehealth: Payer: Self-pay

## 2019-01-07 ENCOUNTER — Other Ambulatory Visit: Payer: Self-pay

## 2019-01-07 ENCOUNTER — Ambulatory Visit (INDEPENDENT_AMBULATORY_CARE_PROVIDER_SITE_OTHER)
Admission: RE | Admit: 2019-01-07 | Discharge: 2019-01-07 | Disposition: A | Discharge status: Home / Self Care | Payer: BC Managed Care – PPO | Source: Ambulatory Visit | Attending: Cardiovascular Disease | Admitting: Cardiovascular Disease

## 2019-01-07 DIAGNOSIS — R0789 Other chest pain: Secondary | ICD-10-CM

## 2019-01-07 NOTE — Telephone Encounter (Signed)
Please call pt to schedule CPE with fasting labs.  No further refills until pt is seen.  Charyl Bigger, CMA

## 2019-02-03 ENCOUNTER — Other Ambulatory Visit: Payer: Self-pay | Admitting: Adult Health

## 2019-02-12 ENCOUNTER — Telehealth: Payer: Self-pay | Admitting: Adult Health

## 2019-02-12 NOTE — Telephone Encounter (Signed)
Called patient to set up provider required CPE for Rx refill---left message--pt not available a call back was requested.  --FYI to medical asst.  --glh

## 2019-02-25 ENCOUNTER — Ambulatory Visit: Payer: BC Managed Care – PPO | Admitting: Adult Health

## 2019-02-26 ENCOUNTER — Encounter: Payer: Self-pay | Admitting: Adult Health

## 2019-02-26 ENCOUNTER — Other Ambulatory Visit: Payer: Self-pay

## 2019-02-26 ENCOUNTER — Ambulatory Visit (INDEPENDENT_AMBULATORY_CARE_PROVIDER_SITE_OTHER): Payer: BC Managed Care – PPO | Admitting: Adult Health

## 2019-02-26 DIAGNOSIS — Z0000 Encounter for general adult medical examination without abnormal findings: Secondary | ICD-10-CM

## 2019-02-26 DIAGNOSIS — F339 Major depressive disorder, recurrent, unspecified: Secondary | ICD-10-CM | POA: Diagnosis not present

## 2019-02-26 MED ORDER — TOPIRAMATE 100 MG PO TABS: ORAL_TABLET | ORAL | 3 refills | Status: DC

## 2019-02-26 NOTE — Progress Notes (Signed)
Virtual Visit via Telephone Note  I connected with Karen Gross on 02/26/19 at  2:00 PM EDT by telephone and verified that I am speaking with the correct person using two identifiers.  Location: Patient: Home Provider: In Clinic   I discussed the limitations, risks, security and privacy concerns of performing an evaluation and management service by telephone and the availability of in person appointments. I also discussed with the patient that there may be a patient responsible charge related to this service. The patient expressed understanding and agreed to proceed.   History of Present Illness: 09/12/2018 OV: Ms. Karen Gross is here for 6 month f/u: Migraine, Obesity, Depression, hypothyroidism She reports mood stable, denies thoughts of harming herself/others She denies increase in sadness or hopelessness She reports being on Abilify 10mg  for "maybe over five years ago" She was previously trialed on several on SSRIs that did not control her depression and eventually Abilify was found to be the most successful to control depression and stabilize overall mood She denies every being told she was Bipolar. She is also on Topiramate 100mg  BID for seizure control, denies any seizure activity in last 5 years. She continues to struggle with weight- continues to walk 1 mile "during 3rd block" but no additional exercise above that. 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 Discussed that she could walk or perform body weight exercises while she watches her children play basketball, baseball, softball Her diet is excellent- primarily fruits/vegetables/lean proteins She continues to abstain from tobacco/vape/ETOH use She denies current CP/dyspnea/dizziness/palpiations Upcoming cards appt 10/25/2018 02/26/2019  OV: Ms. Karen Gross calls in today regular f/u: migraine HA, Hypothyroidism, Recurrent Depression She reports being migraine free >2 years She estimates to drink >100 oz water/day She follows heart healthy diet and walks as often as she can- but reports frequent travel due to her children's sports has made it difficult this summer She denies acute cardiac sx's She reports quality sleep She reports stable mood, denies SI/HI- currently on Abilify 10mg  QD- this was only rx that worked per pt  TSH 08/14/2018-WNL,3.900  Patient Care Team    Relationship Specialty Notifications Start End  [place], Orpha BurKaty D, NP PCP - General Family Medicine  12/27/16   Nita SellsHall, John, MD  Dermatology  12/27/16     Patient Active Problem List   Diagnosis Date Noted  . Depression, recurrent (HCC) 09/13/2018  . Hx of partial seizures 09/13/2018  . Healthcare maintenance 09/12/2018  . AV block, 1st degree 08/14/2018  . Chest pain 08/14/2018  . Bronchitis 04/30/2018  . Acute viral sinusitis 03/21/2018  . Special screening for malignant neoplasms, colon 09/27/2017  . Excessive cerumen in ear canal, bilateral 04/18/2017  . Gastroesophageal reflux disease 12/27/2016  . BMI 31.0-31.9,adult 12/27/2016  . Hypothyroidism 10/23/2015  . Migraine 10/23/2015     Past Medical History:  Diagnosis Date  . Anxiety   . Bartholin cyst   . Hypothyroidism   . Seizures (HCC)    Stress Induced     Past Surgical History:  Procedure Laterality Date  . ABDOMINAL HYSTERECTOMY    . KNEE ARTHROSCOPY Right      Family History  Problem Relation Age of Onset  . Breast cancer Mother   . Heart disease Mother   . Breast cancer Paternal Grandmother   . Colon cancer Neg Hx   .  Stomach cancer Neg Hx      Social History   Substance and Sexual Activity  Drug Use No     Social History   Substance and Sexual Activity  Alcohol Use Yes   Comment: once a month- wine     Social History   Tobacco Use  Smoking Status Never  Smoker  Smokeless Tobacco Never Used     Outpatient Encounter Medications as of 02/26/2019  Medication Sig  . ARIPiprazole (ABILIFY) 10 MG tablet TAKE 1 TABLET BY MOUTH DAILY.  [name] levothyroxine (SYNTHROID, LEVOTHROID) 100 MCG tablet TAKE 1 TABLET BY MOUTH DAILY BEFORE BREAKFAST.  [name] omeprazole (PRILOSEC) 20 MG capsule Take 20 mg by mouth daily.  [name] topiramate (TOPAMAX) 100 MG tablet 1 tablet by mouth twice daily  . [DISCONTINUED] topiramate (TOPAMAX) 100 MG tablet TAKE 1 TABLET BY MOUTH 2 TIMES DAILY. PATIENT MUST HAVE OFFICE VISIT PRIOR TO ANY FURTHER REFILLS   No facility-administered encounter medications on file as of 02/26/2019.     Allergies: Patient has no known allergies.  Body mass index is 32.19 kg/m.  Blood pressure 110/70, pulse 76, temperature 98.6 F (37 C), temperature source Oral, height 5\' 7"  (1.702 m), weight 205 lb 8 oz (93.2 kg). Review of Systems: General:   Denies fever, chills, unexplained weight loss.  Optho/Auditory:   Denies visual changes, blurred vision/LOV Respiratory:   Denies SOB, DOE more than baseline levels.  Cardiovascular:   Denies chest pain, palpitations, new onset peripheral edema  Gastrointestinal:   Denies nausea, vomiting, diarrhea.  Genitourinary: Denies dysuria, freq/ urgency, flank pain or discharge from genitals.  Endocrine:     Denies hot or cold intolerance, polyuria, polydipsia. Musculoskeletal:   Denies unexplained myalgias, joint swelling, unexplained arthralgias, gait problems.  Skin:  Denies rash, suspicious lesions Neurological:     Denies dizziness, unexplained weakness, numbness  Psychiatric/Behavioral:   Denies mood changes, suicidal or homicidal ideations, hallucinations This patient does not have sx concerning for COVID-19 Infection (ie; fever, chills, cough, new or worsening shortness of breath).  Observations/Objective: No acute distress noted during the telephone conversation  Assessment and Plan: Continue all  medications as directed. Continue excellent water intake, follow heart healthy diet Increase daily walking, strive for 38mins/day Continue to abstain from tobacco/ETOH use Continue to social distance and wear mask when in public  Follow Up Instructions: Fasting labs 1-2 weeks OV 6 months   I discussed the assessment and treatment plan with the patient. The patient was provided an opportunity to ask questions and all were answered. The patient agreed with the plan and demonstrated an understanding of the instructions.   The patient was advised to call back or seek an in-person evaluation if the symptoms worsen or if the condition fails to improve as anticipated.  I provided 12 minutes of non-face-to-face time during this encounter.   Esaw Grandchild, NP

## 2019-02-26 NOTE — Assessment & Plan Note (Signed)
Assessment and Plan: Continue all medications as directed. Continue excellent water intake, follow heart healthy diet Increase daily walking, strive for 49mins/day Continue to abstain from tobacco/ETOH use Continue to social distance and wear mask when in public  Follow Up Instructions: Fasting labs 1-2 weeks OV 6 months   I discussed the assessment and treatment plan with the patient. The patient was provided an opportunity to ask questions and all were answered. The patient agreed with the plan and demonstrated an understanding of the instructions.

## 2019-02-26 NOTE — Assessment & Plan Note (Signed)
She reports stable mood, denies SI/HI- currently on Abilify 10mg  QD- this was only rx that worked per pt

## 2019-03-10 NOTE — Telephone Encounter (Signed)
Completed 01/07/2019

## 2019-03-11 ENCOUNTER — Other Ambulatory Visit: Payer: BC Managed Care – PPO

## 2019-03-11 ENCOUNTER — Other Ambulatory Visit: Payer: Self-pay

## 2019-03-11 DIAGNOSIS — Z0000 Encounter for general adult medical examination without abnormal findings: Secondary | ICD-10-CM

## 2019-03-11 DIAGNOSIS — E039 Hypothyroidism, unspecified: Secondary | ICD-10-CM

## 2019-03-12 ENCOUNTER — Ambulatory Visit (INDEPENDENT_AMBULATORY_CARE_PROVIDER_SITE_OTHER): Payer: BC Managed Care – PPO | Admitting: Adult Health

## 2019-03-12 ENCOUNTER — Encounter: Payer: Self-pay | Admitting: Adult Health

## 2019-03-12 ENCOUNTER — Ambulatory Visit: Payer: BC Managed Care – PPO | Admitting: Adult Health

## 2019-03-12 VITALS — BP 106/71 | HR 66 | Temp 98.4°F | Ht 67.0 in | Wt 215.2 lb

## 2019-03-12 DIAGNOSIS — E039 Hypothyroidism, unspecified: Secondary | ICD-10-CM

## 2019-03-12 DIAGNOSIS — Z0000 Encounter for general adult medical examination without abnormal findings: Secondary | ICD-10-CM

## 2019-03-12 LAB — LIPID PANEL
Chol/HDL Ratio: 3.3 ratio (ref 0.0–4.4)
Cholesterol, Total: 204 mg/dL — ABNORMAL HIGH (ref 100–199)
HDL: 61 mg/dL (ref >39)
LDL Calculated: 129 mg/dL — ABNORMAL HIGH (ref 0–99)
Triglycerides: 69 mg/dL (ref 0–149)
VLDL Cholesterol Cal: 14 mg/dL (ref 5–40)

## 2019-03-12 LAB — HEMOGLOBIN A1C
Est. average glucose Bld gHb Est-mCnc: 105 mg/dL
Hgb A1c MFr Bld: 5.3 % (ref 4.8–5.6)

## 2019-03-12 LAB — COMPREHENSIVE METABOLIC PANEL
ALT: 10 IU/L (ref 0–32)
AST: 15 IU/L (ref 0–40)
Albumin/Globulin Ratio: 1.6 (ref 1.2–2.2)
Albumin: 4.1 g/dL (ref 3.8–4.8)
Alkaline Phosphatase: 113 IU/L (ref 39–117)
BUN/Creatinine Ratio: 20 (ref 9–23)
BUN: 18 mg/dL (ref 6–24)
Bilirubin Total: 0.3 mg/dL (ref 0.0–1.2)
CO2: 20 mmol/L (ref 20–29)
Calcium: 9 mg/dL (ref 8.7–10.2)
Chloride: 109 mmol/L — ABNORMAL HIGH (ref 96–106)
Creatinine, Ser: 0.92 mg/dL (ref 0.57–1.00)
GFR calc Af Amer: 84 mL/min/{1.73_m2} (ref >59)
GFR calc non Af Amer: 73 mL/min/{1.73_m2} (ref >59)
Globulin, Total: 2.5 g/dL (ref 1.5–4.5)
Glucose: 85 mg/dL (ref 65–99)
Potassium: 4.1 mmol/L (ref 3.5–5.2)
Sodium: 142 mmol/L (ref 134–144)
Total Protein: 6.6 g/dL (ref 6.0–8.5)

## 2019-03-12 LAB — CBC WITH DIFFERENTIAL/PLATELET
Basophils Absolute: 0 10*3/uL (ref 0.0–0.2)
Basos: 1 %
EOS (ABSOLUTE): 0.1 10*3/uL (ref 0.0–0.4)
Eos: 2 %
Hematocrit: 40.9 % (ref 34.0–46.6)
Hemoglobin: 13.5 g/dL (ref 11.1–15.9)
Immature Grans (Abs): 0 10*3/uL (ref 0.0–0.1)
Immature Granulocytes: 0 %
Lymphocytes Absolute: 1.6 10*3/uL (ref 0.7–3.1)
Lymphs: 32 %
MCH: 26.8 pg (ref 26.6–33.0)
MCHC: 33 g/dL (ref 31.5–35.7)
MCV: 81 fL (ref 79–97)
Monocytes Absolute: 0.3 10*3/uL (ref 0.1–0.9)
Monocytes: 6 %
Neutrophils Absolute: 2.9 10*3/uL (ref 1.4–7.0)
Neutrophils: 59 %
Platelets: 250 10*3/uL (ref 150–450)
RBC: 5.03 x10E6/uL (ref 3.77–5.28)
RDW: 14.7 % (ref 11.7–15.4)
WBC: 4.9 10*3/uL (ref 3.4–10.8)

## 2019-03-12 LAB — TSH: TSH: 12.2 u[IU]/mL — ABNORMAL HIGH (ref 0.450–4.500)

## 2019-03-12 MED ORDER — LEVOTHYROXINE SODIUM 112 MCG PO TABS: ORAL_TABLET | ORAL | 0 refills | Status: DC

## 2019-03-12 NOTE — Assessment & Plan Note (Signed)
Continue all medications with one change- increased Levothyroxine to 18mcg due to elevated TSH level. Please schedule non-fasting lab appt in 6 weeks to re-check. Please schedule office visit in 6 months. Continue to social distance and wear a mask when in public.

## 2019-03-12 NOTE — Progress Notes (Signed)
Subjective:    Patient ID: Karen Gross, female    DOB: 01/16/1966, 53 y.o.   MRN: [id number]  HPI:  Karen Gross presents with bil cerumen impaction and decreased hearing. She denies HA/dizziness. She reports this will occur 1-2 times/year She doe not use tobacco/vape products She reports recent weight gain due to frequent travel and fast food consumption Reviewed recent labs- 03/11/2019 Labs- TSH-12.2, up from 3.9- will adjust levothyroxine  The 10-year ASCVD risk score Denman George(Goff DC Montez HagemanJr., et al., 2013) is: 0.8%  Values used to calculate the score:   Age: 53 years   Sex: Female   Is Non-Hispanic African American: No   Diabetic: No   Tobacco smoker: No   Systolic Blood Pressure: 110 mmHg   Is BP treated: No   HDL Cholesterol: 61 mg/dL   Total Cholesterol: 409204 mg/dL  W1X-BJYA1c-WNL, 5.3  CMP-stable  CBC-stable  Patient Care Team    Relationship Specialty Notifications Start End  William Hamburgeranford, Smriti Barkow D, NP PCP - General Family Medicine  12/27/16   Nita SellsHall, John, MD  Dermatology  12/27/16     Patient Active Problem List   Diagnosis Date Noted  . Depression, recurrent (HCC) 09/13/2018  . Hx of partial seizures 09/13/2018  . Healthcare maintenance 09/12/2018  . AV block, 1st degree 08/14/2018  . Chest pain 08/14/2018  . Bronchitis 04/30/2018  . Acute viral sinusitis 03/21/2018  . Special screening for malignant neoplasms, colon 09/27/2017  . Excessive cerumen in ear canal, bilateral 04/18/2017  . Gastroesophageal reflux disease 12/27/2016  . BMI 31.0-31.9,adult 12/27/2016  . Hypothyroidism 10/23/2015  . Migraine 10/23/2015     Past Medical History:  Diagnosis Date  . Anxiety   . Bartholin cyst   . Hypothyroidism   . Seizures (HCC)    Stress Induced     Past Surgical History:  Procedure Laterality Date  . ABDOMINAL HYSTERECTOMY    . KNEE ARTHROSCOPY Right      Family History  Problem Relation Age of Onset  . Breast cancer Mother   . Heart  disease Mother   . Breast cancer Paternal Grandmother   . Colon cancer Neg Hx   . Stomach cancer Neg Hx      Social History   Substance and Sexual Activity  Drug Use No     Social History   Substance and Sexual Activity  Alcohol Use Yes   Comment: once a month- wine     Social History   Tobacco Use  Smoking Status Never Smoker  Smokeless Tobacco Never Used     Outpatient Encounter Medications as of 03/12/2019  Medication Sig  . ARIPiprazole (ABILIFY) 10 MG tablet TAKE 1 TABLET BY MOUTH DAILY.  [name]. levothyroxine (SYNTHROID) 112 MCG tablet TAKE 1 TABLET BY MOUTH DAILY BEFORE BREAKFAST.  [name]. omeprazole (PRILOSEC) 20 MG capsule Take 20 mg by mouth daily.  [name]. topiramate (TOPAMAX) 100 MG tablet 1 tablet by mouth twice daily  . [DISCONTINUED] levothyroxine (SYNTHROID, LEVOTHROID) 100 MCG tablet TAKE 1 TABLET BY MOUTH DAILY BEFORE BREAKFAST.   No facility-administered encounter medications on file as of 03/12/2019.     Allergies: Patient has no known allergies.  Body mass index is 33.71 kg/m.  Blood pressure 106/71, pulse 66, temperature 98.4 F (36.9 C), temperature source Oral, height 5\' 7"  (1.702 m), weight 215 lb 3.2 oz (97.6 kg), SpO2 100 %.  Review of Systems  Constitutional: Positive for fatigue. Negative for activity change, appetite change, chills, diaphoresis, fever and unexpected weight change.  HENT: Positive for hearing loss. Negative for congestion, ear discharge and ear pain.   Eyes: Negative for visual disturbance.  Respiratory: Negative for cough, chest tightness, shortness of breath, wheezing and stridor.   Cardiovascular: Negative for chest pain, palpitations and leg swelling.  Endocrine: Negative for cold intolerance, heat intolerance, polydipsia, polyphagia and polyuria.  Skin: Negative for color change, pallor, rash and wound.  Neurological: Negative for dizziness and headaches.  Hematological: Negative for adenopathy. Does not bruise/bleed easily.        Objective:   Physical Exam Vitals signs and nursing note reviewed.  Constitutional:      General: She is not in acute distress.    Appearance: Normal appearance. She is obese. She is not ill-appearing, toxic-appearing or diaphoretic.  HENT:     Head: Normocephalic and atraumatic.     Right Ear: No decreased hearing noted. There is no impacted cerumen.     Left Ear: No decreased hearing noted. There is no impacted cerumen.     Ears:     Comments: Prior to ear flush- bil cerumen impaction, decrease in hearing After ear flush- both TMs easily visualized and hearing restored to normal Cardiovascular:     Rate and Rhythm: Normal rate and regular rhythm.     Pulses: Normal pulses.     Heart sounds: Normal heart sounds. No murmur. No friction rub.  Pulmonary:     Effort: Pulmonary effort is normal. No respiratory distress.     Breath sounds: Normal breath sounds. No stridor. No wheezing, rhonchi or rales.  Chest:     Chest wall: No tenderness.  Neurological:     Mental Status: She is alert and oriented to person, place, and time.  Psychiatric:        Mood and Affect: Mood normal.        Behavior: Behavior normal.        Thought Content: Thought content normal.        Judgment: Judgment normal.        Assessment & Plan:   1. Hypothyroidism, unspecified type   2. Healthcare maintenance     Healthcare maintenance Continue all medications with one change- increased Levothyroxine to 140mcg due to elevated TSH level. Please schedule non-fasting lab appt in 6 weeks to re-check. Please schedule office visit in 6 months. Continue to social distance and wear a mask when in public.  Hypothyroidism 03/11/2019  TSH- 12.2  Levothyroxine to 148mcg  Please schedule non-fasting lab appt in 6 weeks to re-check.    FOLLOW-UP:  Return in about 6 months (around 09/12/2019) for Regular Follow Up.

## 2019-03-12 NOTE — Patient Instructions (Addendum)
Hypothyroidism  Hypothyroidism is when the thyroid gland does not make enough of certain hormones (it is underactive). The thyroid gland is a small gland located in the lower front part of the neck, just in front of the windpipe (trachea). This gland makes hormones that help control how the body uses food for energy (metabolism) as well as how the heart and brain function. These hormones also play a role in keeping your bones strong. When the thyroid is underactive, it produces too little of the hormones thyroxine (T4) and triiodothyronine (T3). What are the causes? This condition may be caused by:  Hashimoto's disease. This is a disease in which the body's disease-fighting system (immune system) attacks the thyroid gland. This is the most common cause.  Viral infections.  Pregnancy.  Certain medicines.  Birth defects.  Past radiation treatments to the head or neck for cancer.  Past treatment with radioactive iodine.  Past exposure to radiation in the environment.  Past surgical removal of part or all of the thyroid.  Problems with a gland in the center of the brain (pituitary gland).  Lack of enough iodine in the diet. What increases the risk? You are more likely to develop this condition if:  You are female.  You have a family history of thyroid conditions.  You use a medicine called lithium.  You take medicines that affect the immune system (immunosuppressants). What are the signs or symptoms? Symptoms of this condition include:  Feeling as though you have no energy (lethargy).  Not being able to tolerate cold.  Weight gain that is not explained by a change in diet or exercise habits.  Lack of appetite.  Dry skin.  Coarse hair.  Menstrual irregularity.  Slowing of thought processes.  Constipation.  Sadness or depression. How is this diagnosed? This condition may be diagnosed based on:  Your symptoms, your medical history, and a physical exam.  Blood  tests. You may also have imaging tests, such as an ultrasound or MRI. How is this treated? This condition is treated with medicine that replaces the thyroid hormones that your body does not make. After you begin treatment, it may take several weeks for symptoms to go away. Follow these instructions at home:  Take over-the-counter and prescription medicines only as told by your health care provider.  If you start taking any new medicines, tell your health care provider.  Keep all follow-up visits as told by your health care provider. This is important. ? As your condition improves, your dosage of thyroid hormone medicine may change. ? You will need to have blood tests regularly so that your health care provider can monitor your condition. Contact a health care provider if:  Your symptoms do not get better with treatment.  You are taking thyroid replacement medicine and you: ? Sweat a lot. ? Have tremors. ? Feel anxious. ? Lose weight rapidly. ? Cannot tolerate heat. ? Have emotional swings. ? Have diarrhea. ? Feel weak. Get help right away if you have:  Chest pain.  An irregular heartbeat.  A rapid heartbeat.  Difficulty breathing. Summary  Hypothyroidism is when the thyroid gland does not make enough of certain hormones (it is underactive).  When the thyroid is underactive, it produces too little of the hormones thyroxine (T4) and triiodothyronine (T3).  The most common cause is Hashimoto's disease, a disease in which the body's disease-fighting system (immune system) attacks the thyroid gland. The condition can also be caused by viral infections, medicine, pregnancy, or past   radiation treatment to the head or neck.  Symptoms may include weight gain, dry skin, constipation, feeling as though you do not have energy, and not being able to tolerate cold.  This condition is treated with medicine to replace the thyroid hormones that your body does not make. This information  is not intended to replace advice given to you by your health care provider. Make sure you discuss any questions you have with your health care provider. Document Released: 07/17/2005 Document Revised: 06/29/2017 Document Reviewed: 06/27/2017 Elsevier Patient Education  2020 [place] all medications with one change- increased Levothyroxine to 14mcg due to elevated TSH level. Please schedule non-fasting lab appt in 6 weeks to re-check. Please schedule office visit in 6 months. Continue to social distance and wear a mask when in public. GREAT TO SEE YOU!

## 2019-03-12 NOTE — Assessment & Plan Note (Signed)
03/11/2019  TSH- 12.2  Levothyroxine to 138mcg  Please schedule non-fasting lab appt in 6 weeks to re-check.

## 2019-04-22 ENCOUNTER — Other Ambulatory Visit (INDEPENDENT_AMBULATORY_CARE_PROVIDER_SITE_OTHER): Payer: BC Managed Care – PPO

## 2019-04-22 ENCOUNTER — Other Ambulatory Visit: Payer: Self-pay

## 2019-04-22 DIAGNOSIS — E039 Hypothyroidism, unspecified: Secondary | ICD-10-CM | POA: Diagnosis not present

## 2019-04-23 LAB — TSH: TSH: 2.95 u[IU]/mL (ref 0.450–4.500)

## 2019-04-29 ENCOUNTER — Other Ambulatory Visit: Payer: Self-pay | Admitting: Adult Health

## 2019-06-10 ENCOUNTER — Other Ambulatory Visit: Payer: Self-pay | Admitting: Adult Health

## 2019-07-03 ENCOUNTER — Encounter: Payer: Self-pay | Admitting: Adult Health

## 2019-07-03 ENCOUNTER — Ambulatory Visit: Payer: BC Managed Care – PPO | Admitting: Adult Health

## 2019-07-03 ENCOUNTER — Other Ambulatory Visit: Payer: Self-pay

## 2019-07-03 DIAGNOSIS — Z0000 Encounter for general adult medical examination without abnormal findings: Secondary | ICD-10-CM | POA: Diagnosis not present

## 2019-07-03 DIAGNOSIS — H6123 Impacted cerumen, bilateral: Secondary | ICD-10-CM

## 2019-07-03 NOTE — Assessment & Plan Note (Signed)
Remain well hydrated. Continue all medications as directed. Follow-up 2 months for chronic medical conditions. Continue to social distance and wear a mask when in public.

## 2019-07-03 NOTE — Assessment & Plan Note (Signed)
Ears lavaged, pt tolerated well.

## 2019-07-03 NOTE — Patient Instructions (Addendum)
Earwax Buildup, Adult The ears produce a substance called earwax that helps keep bacteria out of the ear and protects the skin in the ear canal. Occasionally, earwax can build up in the ear and cause discomfort or hearing loss. What increases the risk? This condition is more likely to develop in people who:  Are female.  Are elderly.  Naturally produce more earwax.  Clean their ears often with cotton swabs.  Use earplugs often.  Use in-ear headphones often.  Wear hearing aids.  Have narrow ear canals.  Have earwax that is overly thick or sticky.  Have eczema.  Are dehydrated.  Have excess hair in the ear canal. What are the signs or symptoms? Symptoms of this condition include:  Reduced or muffled hearing.  A feeling of fullness in the ear or feeling that the ear is plugged.  Fluid coming from the ear.  Ear pain.  Ear itch.  Ringing in the ear.  Coughing.  An obvious piece of earwax that can be seen inside the ear canal. How is this diagnosed? This condition may be diagnosed based on:  Your symptoms.  Your medical history.  An ear exam. During the exam, your health care provider will look into your ear with an instrument called an otoscope. You may have tests, including a hearing test. How is this treated? This condition may be treated by:  Using ear drops to soften the earwax.  Having the earwax removed by a health care provider. The health care provider may: ? Flush the ear with water. ? Use an instrument that has a loop on the end (curette). ? Use a suction device.  Surgery to remove the wax buildup. This may be done in severe cases. Follow these instructions at home:   Take over-the-counter and prescription medicines only as told by your health care provider.  Do not put any objects, including cotton swabs, into your ear. You can clean the opening of your ear canal with a washcloth or facial tissue.  Follow instructions from your health care  provider about cleaning your ears. Do not over-clean your ears.  Drink enough fluid to keep your urine clear or pale yellow. This will help to thin the earwax.  Keep all follow-up visits as told by your health care provider. If earwax builds up in your ears often or if you use hearing aids, consider seeing your health care provider for routine, preventive ear cleanings. Ask your health care provider how often you should schedule your cleanings.  If you have hearing aids, clean them according to instructions from the manufacturer and your health care provider. Contact a health care provider if:  You have ear pain.  You develop a fever.  You have blood, pus, or other fluid coming from your ear.  You have hearing loss.  You have ringing in your ears that does not go away.  Your symptoms do not improve with treatment.  You feel like the room is spinning (vertigo). Summary  Earwax can build up in the ear and cause discomfort or hearing loss.  The most common symptoms of this condition include reduced or muffled hearing and a feeling of fullness in the ear or feeling that the ear is plugged.  This condition may be diagnosed based on your symptoms, your medical history, and an ear exam.  This condition may be treated by using ear drops to soften the earwax or by having the earwax removed by a health care provider.  Do not put any   objects, including cotton swabs, into your ear. You can clean the opening of your ear canal with a washcloth or facial tissue. This information is not intended to replace advice given to you by your health care provider. Make sure you discuss any questions you have with your health care provider. Document Released: 08/24/2004 Document Revised: 06/29/2017 Document Reviewed: 09/27/2016 Elsevier Patient Education  2020 [place] well hydrated. Continue all medications as directed. Follow-up 2 months for chronic medical conditions. Continue to  social distance and wear a mask when in public. GREAT TO SEE YOU!

## 2019-07-03 NOTE — Progress Notes (Signed)
Subjective:    Patient ID: Karen Gross, female    DOB: 1966/04/06, 52 y.o.   MRN: [id number]  HPI:  Karen Gross is here for bil cerumen impaction. She denies decrease in hearing/dizziness/imbalance. She denies URI. She denies otoalgia  She has gained 4 lbs since last OV in August Current wt 219 Body mass index is 34.35 kg/m.  TSH 04/22/2019 2.950- currently on Levothyroxine QD She reports walking >2 miles M-F, has stopped eating out due to not travelling for her children's sports teams. She estimates to drink >100 oz water/day  Patient Care Team    Relationship Specialty Notifications Start End  William Hamburger D, NP PCP - General Family Medicine  12/27/16   Nita Sells, MD  Dermatology  12/27/16     Patient Active Problem List   Diagnosis Date Noted  . Depression, recurrent (HCC) 09/13/2018  . Hx of partial seizures 09/13/2018  . Healthcare maintenance 09/12/2018  . AV block, 1st degree 08/14/2018  . Chest pain 08/14/2018  . Bronchitis 04/30/2018  . Acute viral sinusitis 03/21/2018  . Special screening for malignant neoplasms, colon 09/27/2017  . Excessive cerumen in ear canal, bilateral 04/18/2017  . Gastroesophageal reflux disease 12/27/2016  . BMI 31.0-31.9,adult 12/27/2016  . Hypothyroidism 10/23/2015  . Migraine 10/23/2015     Past Medical History:  Diagnosis Date  . Anxiety   . Bartholin cyst   . Hypothyroidism   . Seizures (HCC)    Stress Induced     Past Surgical History:  Procedure Laterality Date  . ABDOMINAL HYSTERECTOMY    . KNEE ARTHROSCOPY Right      Family History  Problem Relation Age of Onset  . Breast cancer Mother   . Heart disease Mother   . Breast cancer Paternal Grandmother   . Colon cancer Neg Hx   . Stomach cancer Neg Hx      Social History   Substance and Sexual Activity  Drug Use No     Social History   Substance and Sexual Activity  Alcohol Use Yes   Comment: once a month- wine     Social History    Tobacco Use  Smoking Status Never Smoker  Smokeless Tobacco Never Used     Outpatient Encounter Medications as of 07/03/2019  Medication Sig  . ARIPiprazole (ABILIFY) 10 MG tablet TAKE 1 TABLET BY MOUTH DAILY.  [name] levothyroxine (SYNTHROID) 112 MCG tablet TAKE 1 TABLET BY MOUTH DAILY BEFORE BREAKFAST.  [name] omeprazole (PRILOSEC) 20 MG capsule Take 20 mg by mouth daily.  [name] topiramate (TOPAMAX) 100 MG tablet 1 tablet by mouth twice daily   No facility-administered encounter medications on file as of 07/03/2019.     Allergies: Patient has no known allergies.  Body mass index is 34.35 kg/m.  Blood pressure 104/66, pulse 71, temperature 98.8 F (37.1 C), temperature source Oral, height 5\' 7"  (1.702 m), weight 219 lb 4.8 oz (99.5 kg), SpO2 100 %.  Review of Systems  Constitutional: Positive for fatigue. Negative for activity change, appetite change, chills, diaphoresis, fever and unexpected weight change.  HENT: Negative for congestion, ear discharge, ear pain, hearing loss, rhinorrhea and sinus pain.   Eyes: Negative for visual disturbance.  Respiratory: Negative for cough, chest tightness, shortness of breath, wheezing and stridor.   Cardiovascular: Negative for chest pain, palpitations and leg swelling.  Neurological: Negative for dizziness and headaches.  Hematological: Negative for adenopathy. Does not bruise/bleed easily.       Objective:   Physical  Exam Vitals signs and nursing note reviewed.  Constitutional:      General: She is not in acute distress.    Appearance: Normal appearance. She is obese. She is not ill-appearing, toxic-appearing or diaphoretic.  HENT:     Head: Normocephalic and atraumatic.     Right Ear: Hearing normal. No decreased hearing noted. There is impacted cerumen. Tympanic membrane is not bulging.     Left Ear: Hearing normal. No decreased hearing noted. There is impacted cerumen. Tympanic membrane is not bulging.     Ears:     Comments: Bil cerumen  impaction- ears lavaged- pt tolerated well Neurological:     Mental Status: She is alert.       Assessment & Plan:   1. Healthcare maintenance   2. Excessive cerumen in ear canal, bilateral     Healthcare maintenance Remain well hydrated. Continue all medications as directed. Follow-up 2 months for chronic medical conditions. Continue to social distance and wear a mask when in public.  Excessive cerumen in ear canal, bilateral Ears lavaged, pt tolerated well.     FOLLOW-UP:  Return in about 2 months (around 09/03/2019) for Regular Follow Up.

## 2019-07-23 ENCOUNTER — Other Ambulatory Visit: Payer: Self-pay | Admitting: Adult Health

## 2019-09-10 NOTE — Progress Notes (Signed)
Subjective:    Patient ID: Karen Gross, female    DOB: 16-Feb-1966, 54 y.o.   MRN: [id number]  HPI: 03/12/2019 OV: Karen Gross presents with bil cerumen impaction and decreased hearing. She denies HA/dizziness. She reports this will occur 1-2 times/year She doe not use tobacco/vape products She reports recent weight gain due to frequent travel and fast food consumption Reviewed recent labs- 03/11/2019 Labs- TSH-12.2, up from 3.9- will adjust levothyroxine  The 10-year ASCVD risk score Karen Gross DC Montez Hageman., et al., 2013) is: 0.8%  Values used to calculate the score:   Age: 74 years   Sex: Female   Is Non-Hispanic African American: No   Diabetic: No   Tobacco smoker: No   Systolic Blood Pressure: 110 mmHg   Is BP treated: No   HDL Cholesterol: 61 mg/dL   Total Cholesterol: 902 mg/dL  [id number], 5.3  CMP-stable  CBC-stable  09/11/2019 OV: Karen Gross is here for regular f/u: Recurrent Depression, Hypothyroidism, Obesity She continues to walk daily and remain well hydrated with water, estimates to drink  .>80 oz per day. She feels that follows a heart healthy diet, with occasional fast food when travelling. She continues to gain wt, current 223 Body mass index is 35.01 kg/m.  She is agreeable to referral to [facility] for assistance with wt loss. She reports stable mood, denies SI/HI. She continues to abstain from tobacco/vape/ETOH use  Patient Care Team    Relationship Specialty Notifications Start End  William Hamburger D, NP PCP - General Family Medicine  12/27/16   Nita Sells, MD  Dermatology  12/27/16     Patient Active Problem List   Diagnosis Date Noted  . Depression, recurrent (HCC) 09/13/2018  . Hx of partial seizures 09/13/2018  . Healthcare maintenance 09/12/2018  . AV block, 1st degree 08/14/2018  . Chest pain 08/14/2018  . Bronchitis 04/30/2018  . Acute viral sinusitis 03/21/2018  . Special screening for malignant  neoplasms, colon 09/27/2017  . Excessive cerumen in ear canal, bilateral 04/18/2017  . Gastroesophageal reflux disease 12/27/2016  . BMI 35.0-35.9,adult 12/27/2016  . Hypothyroidism 10/23/2015  . Migraine 10/23/2015     Past Medical History:  Diagnosis Date  . Anxiety   . Bartholin cyst   . Hypothyroidism   . Seizures (HCC)    Stress Induced     Past Surgical History:  Procedure Laterality Date  . ABDOMINAL HYSTERECTOMY    . KNEE ARTHROSCOPY Right      Family History  Problem Relation Age of Onset  . Breast cancer Mother   . Heart disease Mother   . Breast cancer Paternal Grandmother   . Colon cancer Neg Hx   . Stomach cancer Neg Hx      Social History   Substance and Sexual Activity  Drug Use No     Social History   Substance and Sexual Activity  Alcohol Use Yes   Comment: once a month- wine     Social History   Tobacco Use  Smoking Status Never Smoker  Smokeless Tobacco Never Used     Outpatient Encounter Medications as of 09/11/2019  Medication Sig  . ARIPiprazole (ABILIFY) 10 MG tablet TAKE 1 TABLET BY MOUTH DAILY.  [name] Ascorbic Acid (VITAMIN C) 500 MG CAPS Take 3 capsules by mouth daily.  . Cholecalciferol (VITAMIN D3) 125 MCG (5000 UT) CAPS Take 1 capsule by mouth daily.  [name] levothyroxine (SYNTHROID) 112 MCG tablet TAKE 1 TABLET BY MOUTH DAILY BEFORE BREAKFAST.  [name]  omeprazole (PRILOSEC) 20 MG capsule Take 20 mg by mouth daily.  [name] topiramate (TOPAMAX) 100 MG tablet TAKE 1 TABLET BY MOUTH TWICE A DAY.  [name] Zinc Sulfate (ZINC 15 PO) Take 1 tablet by mouth daily.   No facility-administered encounter medications on file as of 09/11/2019.    Allergies: Patient has no known allergies.  Body mass index is 35.01 kg/m.  Blood pressure 107/70, pulse 75, temperature 98.2 F (36.8 C), temperature source Oral, height 5\' 7"  (1.702 m), weight 223 lb 8 oz (101.4 kg), SpO2 97 %.     Review of Systems  Constitutional: Positive for fatigue. Negative for  activity change, appetite change, chills, diaphoresis, fever and unexpected weight change.  Eyes: Negative for visual disturbance.  Respiratory: Negative for cough, chest tightness, shortness of breath, wheezing and stridor.   Cardiovascular: Negative for chest pain, palpitations and leg swelling.  Endocrine: Negative for polydipsia, polyphagia and polyuria.  Genitourinary: Negative for difficulty urinating and flank pain.  Neurological: Negative for dizziness and headaches.  Hematological: Negative for adenopathy. Does not bruise/bleed easily.  Psychiatric/Behavioral: Negative for agitation, behavioral problems, confusion, decreased concentration, dysphoric mood, hallucinations, self-injury, sleep disturbance and suicidal ideas. The patient is not nervous/anxious and is not hyperactive.        Objective:   Physical Exam Vitals and nursing note reviewed.  Constitutional:      General: She is not in acute distress.    Appearance: She is obese. She is not ill-appearing, toxic-appearing or diaphoretic.  HENT:     Head: Normocephalic and atraumatic.  Eyes:     Extraocular Movements: Extraocular movements intact.     Conjunctiva/sclera: Conjunctivae normal.     Pupils: Pupils are equal, round, and reactive to light.  Cardiovascular:     Rate and Rhythm: Normal rate and regular rhythm.     Pulses: Normal pulses.     Heart sounds: Normal heart sounds. No murmur. No friction rub. No gallop.   Pulmonary:     Effort: Pulmonary effort is normal. No respiratory distress.     Breath sounds: Normal breath sounds. No stridor. No wheezing, rhonchi or rales.  Chest:     Chest wall: No tenderness.  Skin:    Capillary Refill: Capillary refill takes less than 2 seconds.  Neurological:     Mental Status: She is alert and oriented to person, place, and time.     Coordination: Coordination normal.  Psychiatric:        Mood and Affect: Mood normal.        Behavior: Behavior normal.        Thought  Content: Thought content normal.        Judgment: Judgment normal.         Assessment & Plan:   1. Hypothyroidism, unspecified type   2. BMI 35.0-35.9,adult   3. Healthcare maintenance     Healthcare maintenance Continue all medications as directed. Increase water intake, strive for at least half of your body weight in ounces per day. Follow Mediterranean diet. Increase regular exercise.  Recommend at least 30 minutes daily, 5 days per week of walking, biking, swimming, YouTube/Pinterest workout videos. We will contact you with lab results. Referral to [facility] placed. Continue to social distance and wear a mask when in public. Follow-up with primary care in 3 months for complete physical and fasting labs. It has a been a pleasure to work with you the last few years at [facility].  Hypothyroidism TSH drawn  Continue Levothyroxine QD    FOLLOW-UP:  Return in about 3 months (around 12/09/2019) for CPE, Fasting Labs.

## 2019-09-11 ENCOUNTER — Ambulatory Visit: Payer: BC Managed Care – PPO | Admitting: Adult Health

## 2019-09-11 ENCOUNTER — Encounter: Payer: Self-pay | Admitting: Adult Health

## 2019-09-11 ENCOUNTER — Other Ambulatory Visit: Payer: Self-pay

## 2019-09-11 VITALS — BP 107/70 | HR 75 | Temp 98.2°F | Ht 67.0 in | Wt 223.5 lb

## 2019-09-11 DIAGNOSIS — Z6835 Body mass index (BMI) 35.0-35.9, adult: Secondary | ICD-10-CM

## 2019-09-11 DIAGNOSIS — E039 Hypothyroidism, unspecified: Secondary | ICD-10-CM | POA: Diagnosis not present

## 2019-09-11 DIAGNOSIS — Z0000 Encounter for general adult medical examination without abnormal findings: Secondary | ICD-10-CM | POA: Diagnosis not present

## 2019-09-11 NOTE — Patient Instructions (Signed)
Mediterranean Diet A Mediterranean diet refers to food and lifestyle choices that are based on the traditions of countries located on the [employer]. This way of eating has been shown to help prevent certain conditions and improve outcomes for people who have chronic diseases, like kidney disease and heart disease. What are tips for following this plan? Lifestyle  Cook and eat meals together with your family, when possible.  Drink enough fluid to keep your urine clear or pale yellow.  Be physically active every day. This includes: ? Aerobic exercise like running or swimming. ? Leisure activities like gardening, walking, or housework.  Get 7-8 hours of sleep each night.  If recommended by your health care provider, drink red wine in moderation. This means 1 glass a day for nonpregnant women and 2 glasses a day for men. A glass of wine equals 5 oz (150 mL). Reading food labels   Check the serving size of packaged foods. For foods such as rice and pasta, the serving size refers to the amount of cooked product, not dry.  Check the total fat in packaged foods. Avoid foods that have saturated fat or trans fats.  Check the ingredients list for added sugars, such as corn syrup. Shopping  At the grocery store, buy most of your food from the areas near the walls of the store. This includes: ? Fresh fruits and vegetables (produce). ? Grains, beans, nuts, and seeds. Some of these may be available in unpackaged forms or large amounts (in bulk). ? Fresh seafood. ? Poultry and eggs. ? Low-fat dairy products.  Buy whole ingredients instead of prepackaged foods.  Buy fresh fruits and vegetables in-season from local farmers markets.  Buy frozen fruits and vegetables in resealable bags.  If you do not have access to quality fresh seafood, buy precooked frozen shrimp or canned fish, such as tuna, salmon, or sardines.  Buy small amounts of raw or cooked vegetables, salads, or olives from  the deli or salad bar at your store.  Stock your pantry so you always have certain foods on hand, such as olive oil, canned tuna, canned tomatoes, rice, pasta, and beans. Cooking  Cook foods with extra-virgin olive oil instead of using butter or other vegetable oils.  Have meat as a side dish, and have vegetables or grains as your main dish. This means having meat in small portions or adding small amounts of meat to foods like pasta or stew.  Use beans or vegetables instead of meat in common dishes like chili or lasagna.  Experiment with different cooking methods. Try roasting or broiling vegetables instead of steaming or sauteing them.  Add frozen vegetables to soups, stews, pasta, or rice.  Add nuts or seeds for added healthy fat at each meal. You can add these to yogurt, salads, or vegetable dishes.  Marinate fish or vegetables using olive oil, lemon juice, garlic, and fresh herbs. Meal planning   Plan to eat 1 vegetarian meal one day each week. Try to work up to 2 vegetarian meals, if possible.  Eat seafood 2 or more times a week.  Have healthy snacks readily available, such as: ? Vegetable sticks with hummus. ? [place] yogurt. ? Fruit and nut trail mix.  Eat balanced meals throughout the week. This includes: ? Fruit: 2-3 servings a day ? Vegetables: 4-5 servings a day ? Low-fat dairy: 2 servings a day ? Fish, poultry, or lean meat: 1 serving a day ? Beans and legumes: 2 or more servings a week ?  Nuts and seeds: 1-2 servings a day ? Whole grains: 6-8 servings a day ? Extra-virgin olive oil: 3-4 servings a day  Limit red meat and sweets to only a few servings a month What are my food choices?  Mediterranean diet ? Recommended  Grains: Whole-grain pasta. Brown rice. Bulgar wheat. Polenta. Couscous. Whole-wheat bread. Orpah Cobb.  Vegetables: Artichokes. Beets. Broccoli. Cabbage. Carrots. Eggplant. Green beans. Chard. Kale. Spinach. Onions. Leeks. Peas. Squash.  Tomatoes. Peppers. Radishes.  Fruits: Apples. Apricots. Avocado. Berries. Bananas. Cherries. Dates. Figs. Grapes. Lemons. Melon. Oranges. Peaches. Plums. Pomegranate.  Meats and other protein foods: Beans. Almonds. Sunflower seeds. Pine nuts. Peanuts. Cod. Salmon. Scallops. Shrimp. Tuna. Tilapia. Clams. Oysters. Eggs.  Dairy: Low-fat milk. Cheese. Greek yogurt.  Beverages: Water. Red wine. Herbal tea.  Fats and oils: Extra virgin olive oil. Avocado oil. Grape seed oil.  Sweets and desserts: [place] yogurt with honey. Baked apples. Poached pears. Trail mix.  Seasoning and other foods: Basil. Cilantro. Coriander. Cumin. Mint. Parsley. Sage. Rosemary. Tarragon. Garlic. Oregano. Thyme. Pepper. Balsalmic vinegar. Tahini. Hummus. Tomato sauce. Olives. Mushrooms. ? Limit these  Grains: Prepackaged pasta or rice dishes. Prepackaged cereal with added sugar.  Vegetables: Deep fried potatoes (french fries).  Fruits: Fruit canned in syrup.  Meats and other protein foods: Beef. Pork. Lamb. Poultry with skin. Hot dogs. Tomasa Blase.  Dairy: Ice cream. Sour cream. Whole milk.  Beverages: Juice. Sugar-sweetened soft drinks. Beer. Liquor and spirits.  Fats and oils: Butter. Canola oil. Vegetable oil. Beef fat (tallow). Lard.  Sweets and desserts: Cookies. Cakes. Pies. Candy.  Seasoning and other foods: Mayonnaise. Premade sauces and marinades. The items listed may not be a complete list. Talk with your dietitian about what dietary choices are right for you. Summary  The Mediterranean diet includes both food and lifestyle choices.  Eat a variety of fresh fruits and vegetables, beans, nuts, seeds, and whole grains.  Limit the amount of red meat and sweets that you eat.  Talk with your health care provider about whether it is safe for you to drink red wine in moderation. This means 1 glass a day for nonpregnant women and 2 glasses a day for men. A glass of wine equals 5 oz (150 mL). This information  is not intended to replace advice given to you by your health care provider. Make sure you discuss any questions you have with your health care provider. Document Revised: 03/16/2016 Document Reviewed: 03/09/2016 Elsevier Patient Education  2020 [employer].  Continue all medications as directed. Increase water intake, strive for at least half of your body weight in ounces per day. Follow Mediterranean diet. Increase regular exercise.  Recommend at least 30 minutes daily, 5 days per week of walking, biking, swimming, YouTube/Pinterest workout videos. We will contact you with lab results. Referral to [facility] placed. Continue to social distance and wear a mask when in public. Follow-up with primary care in 3 months for complete physical and fasting labs. It has a been a pleasure to work with you the last few years at [facility]. GREAT TO SEE YOU!

## 2019-09-11 NOTE — Assessment & Plan Note (Signed)
Continue all medications as directed. Increase water intake, strive for at least half of your body weight in ounces per day. Follow Mediterranean diet. Increase regular exercise.  Recommend at least 30 minutes daily, 5 days per week of walking, biking, swimming, YouTube/Pinterest workout videos. We will contact you with lab results. Referral to [facility] placed. Continue to social distance and wear a mask when in public. Follow-up with primary care in 3 months for complete physical and fasting labs. It has a been a pleasure to work with you the last few years at [facility].

## 2019-09-11 NOTE — Assessment & Plan Note (Signed)
TSH drawn  Continue Levothyroxine QD

## 2019-09-12 LAB — TSH: TSH: 1.82 u[IU]/mL (ref 0.450–4.500)

## 2019-09-16 ENCOUNTER — Telehealth: Payer: Self-pay | Admitting: Adult Health

## 2019-09-16 NOTE — Telephone Encounter (Signed)
Patient states Karen Gross called her--forwarding returned message.  -glh

## 2019-10-01 ENCOUNTER — Encounter (INDEPENDENT_AMBULATORY_CARE_PROVIDER_SITE_OTHER): Payer: Self-pay | Admitting: Bariatrics

## 2019-10-01 ENCOUNTER — Ambulatory Visit (INDEPENDENT_AMBULATORY_CARE_PROVIDER_SITE_OTHER): Payer: BC Managed Care – PPO | Admitting: Bariatrics

## 2019-10-01 ENCOUNTER — Other Ambulatory Visit: Payer: Self-pay

## 2019-10-01 VITALS — BP 114/76 | HR 62 | Temp 98.6°F | Ht 66.0 in | Wt 217.0 lb

## 2019-10-01 DIAGNOSIS — Z0289 Encounter for other administrative examinations: Secondary | ICD-10-CM

## 2019-10-01 DIAGNOSIS — Z6835 Body mass index (BMI) 35.0-35.9, adult: Secondary | ICD-10-CM

## 2019-10-01 DIAGNOSIS — Z1331 Encounter for screening for depression: Secondary | ICD-10-CM

## 2019-10-01 DIAGNOSIS — Z9189 Other specified personal risk factors, not elsewhere classified: Secondary | ICD-10-CM | POA: Diagnosis not present

## 2019-10-01 DIAGNOSIS — G43809 Other migraine, not intractable, without status migrainosus: Secondary | ICD-10-CM

## 2019-10-01 DIAGNOSIS — E6609 Other obesity due to excess calories: Secondary | ICD-10-CM

## 2019-10-01 DIAGNOSIS — K219 Gastro-esophageal reflux disease without esophagitis: Secondary | ICD-10-CM | POA: Diagnosis not present

## 2019-10-01 DIAGNOSIS — R5383 Other fatigue: Secondary | ICD-10-CM

## 2019-10-01 DIAGNOSIS — R0602 Shortness of breath: Secondary | ICD-10-CM | POA: Diagnosis not present

## 2019-10-01 DIAGNOSIS — E039 Hypothyroidism, unspecified: Secondary | ICD-10-CM

## 2019-10-01 DIAGNOSIS — Z8669 Personal history of other diseases of the nervous system and sense organs: Secondary | ICD-10-CM

## 2019-10-01 NOTE — Progress Notes (Signed)
Chief Complaint:   OBESITY Karen Gross (MR# [id number]) is a 54 y.o. female who presents for evaluation and treatment of obesity and related comorbidities. Current BMI is Body mass index is 35.02 kg/m.Karen Gross Karen Gross has been struggling with her weight for many years and has been unsuccessful in either losing weight, maintaining weight loss, or reaching her healthy weight goal.  Karen Gross is currently in the action stage of change and ready to dedicate time achieving and maintaining a healthier weight. Karen Gross is interested in becoming our patient and working on intensive lifestyle modifications including (but not limited to) diet and exercise for weight loss.  Karen Gross does not like to cook. She is "somewhat" of a picky eater. She feels like her sweets are her main issue.  Karen Gross's habits were reviewed today and are as follows: Her family eats meals together, her desired weight loss is 32-42 lbs, she started gaining weight in her 40's, her heaviest weight ever was 217 pounds, she is somewhat of a picky eater and doesn't like to eat healthier foods, she makes poor food choices at times, she frequently eats larger portions than normal, she has binge eating behaviors and she struggles with emotional eating.  Depression Screen Karen Gross's Food and Mood (modified PHQ-9) score was 5.  Depression screen PHQ 2/9 10/01/2019  Decreased Interest 1  Down, Depressed, Hopeless 0  PHQ - 2 Score 1  Altered sleeping 1  Tired, decreased energy 3  Change in appetite 0  Feeling bad or failure about yourself  0  Trouble concentrating 0  Moving slowly or fidgety/restless 0  Suicidal thoughts 0  PHQ-9 Score 5  Difficult doing work/chores Not difficult at all  Some recent data might be hidden   Subjective:   Other fatigue. Karen Gross admits to daytime somnolence and denies waking up still tired. Karen Gross has a history of symptoms of Epworth sleepiness scale. Karen Gross generally gets 8 hours of sleep per night,  and states that she has generally restful sleep. Snoring is present. Apneic episodes are not present. Epworth Sleepiness Score is 13.  Shortness of breath on exertion. [name] notes increasing shortness of breath with certain activities and seems to be worsening over time with weight gain. She notes getting out of breath sooner with activity than she used to. [name] denies shortness of breath at rest or orthopnea.  Gastroesophageal reflux disease, unspecified whether esophagitis present. Karen Gross is taking Prilosec. GERD is controlled with medication. She has occasional issues at night.  Hypothyroidism, unspecified type. Dimitri is taking Synthroid. Hypothyroidism is well controlled.  Lab Results  Component Value Date   TSH 1.820 09/11/2019   Hx of partial seizures. Karen Gross reports no seizures in greater than 10 years. She is on no medications.   Other migraine without status migrainosus, not intractable. Karen Gross is taking Topamax. She took Maxalt in the past.  At risk for constipation. Lorane is at increased risk for constipation due to changes in diet. Karen Gross denies hard, infrequent stools currently.   Depression screening. Karen Gross had a mildly positive depression screen with a PHQ-9 score of 5.  Assessment/Plan:   Other fatigue. Fatigue may be related to obesity, depression or many other causes. Labs will be ordered, and in the meanwhile, [name] will focus on self care including making healthy food choices, increasing physical activity and focusing on stress reduction. EKG 12-Lead, Vitamin B12, VITAMIN D 25 Hydroxy (Vit-D Deficiency, Fractures), Insulin, random ordered.  Shortness of breath on exertion. Karen Gross's shortness of breath appears to be  obesity related and exercise induced. She has agreed to work on weight loss and gradually increase exercise to treat her exercise induced shortness of breath. Will continue to monitor closely.   Vitamin B12  Gastroesophageal reflux disease,  unspecified whether esophagitis present. Intensive lifestyle modifications are the first line treatment for this issue. We discussed several lifestyle modifications today and she will continue to work on diet, exercise and weight loss efforts. Orders and follow up as documented in patient record. Karen Gross will continue her medications as directed.  Counseling . If a person has gastroesophageal reflux disease (GERD), food and stomach acid move back up into the esophagus and cause symptoms or problems such as damage to the esophagus. . Anti-reflux measures include: raising the head of the bed, avoiding tight clothing or belts, avoiding eating late at night, not lying down shortly after mealtime, and achieving weight loss. . Avoid ASA, NSAID's, caffeine, alcohol, and tobacco.  . OTC Pepcid and/or Tums are often very helpful for as needed use.  Karen Gross However, for persisting chronic or daily symptoms, stronger medications like Omeprazole may be needed. . You may need to avoid foods and drinks such as: ? Coffee and tea (with or without caffeine). ? Drinks that contain alcohol. ? Energy drinks and sports drinks. ? Bubbly (carbonated) drinks or sodas. ? Chocolate and cocoa. ? Peppermint and mint flavorings. ? Garlic and onions. ? Horseradish. ? Spicy and acidic foods. These include peppers, chili powder, curry powder, vinegar, hot sauces, and BBQ sauce. ? Citrus fruit juices and citrus fruits, such as oranges, lemons, and limes. ? Tomato-based foods. These include red sauce, chili, salsa, and pizza with red sauce. ? Fried and fatty foods. These include donuts, french fries, potato chips, and high-fat dressings.  ? High-fat meats. These include hot dogs, rib eye steak, sausage, ham, and bacon.  Hypothyroidism, unspecified type. Patient with long-standing hypothyroidism, on levothyroxine therapy. She appears euthyroid. Orders and follow up as documented in patient record. Karen Gross will continue her  medications as directed.  Counseling . Good thyroid control is important for overall health. Supratherapeutic thyroid levels are dangerous and will not improve weight loss results. . The correct way to take levothyroxine is fasting, with water, separated by at least 30 minutes from breakfast, and separated by more than 4 hours from calcium, iron, multivitamins, acid reflux medications (PPIs).    Hx of partial seizures. Annye will follow-up with her PCP.  Other migraine without status migrainosus, not intractable. Magdelena will continue Topamax as directed and will resume meds if needed.  At risk for constipation. Trenice was given approximately 15 minutes of counseling today regarding prevention of constipation. She was encouraged to increase water and fiber intake.   Depression screening. Jeannene had a positive depression screening. Depression is commonly associated with obesity and often results in emotional eating behaviors. We will monitor this closely and work on CBT to help improve the non-hunger eating patterns. Referral to Psychology may be required if no improvement is seen as she continues in our clinic.  Class 2 severe obesity with serious comorbidity and body mass index (BMI) of 35.0 to 35.9 in adult, unspecified obesity type (HCC).  Camya is currently in the action stage of change and her goal is to continue with weight loss efforts. I recommend Jaline begin the structured treatment plan as follows:  She has agreed to the Category 2 Plan + 100 calories.  We independently reviewed labs from 03/11/2019 with the patient including lipids, CBC, A1c, and TSH.  Exercise goals: All adults should avoid inactivity. Some physical activity is better than none, and adults who participate in any amount of physical activity gain some health benefits.   Behavioral modification strategies: increasing lean protein intake, decreasing simple carbohydrates, increasing vegetables, increasing water  intake, decreasing eating out, no skipping meals, meal planning and cooking strategies, keeping healthy foods in the home and planning for success.  She was informed of the importance of frequent follow-up visits to maximize her success with intensive lifestyle modifications for her multiple health conditions. She was informed we would discuss her lab results at her next visit unless there is a critical issue that needs to be addressed sooner. Jassmine agreed to keep her next visit at the agreed upon time to discuss these results.  Objective:   Blood pressure 114/76, pulse 62, temperature 98.6 F (37 C), height 5\' 6"  (1.676 m), weight 217 lb (98.4 kg), SpO2 98 %. Body mass index is 35.02 kg/m.  EKG: Sinus  Rhythm with a rate of 62 BPM. RSR(V1) - nondiagnostic. Low voltage - may be related to body habitus.  Otherwise normal.   Indirect Calorimeter completed today shows a VO2 of 259 and a REE of 1800.  Her calculated basal metabolic rate is thus her basal metabolic rate is better than expected.  General: Cooperative, alert, well developed, in no acute distress. HEENT: Conjunctivae and lids unremarkable. Cardiovascular: Regular rhythm.  Lungs: Normal work of breathing. Neurologic: No focal deficits.   Lab Results  Component Value Date   CREATININE 0.92 03/11/2019   BUN 18 03/11/2019   NA 142 03/11/2019   K 4.1 03/11/2019   CL 109 (H) 03/11/2019   CO2 20 03/11/2019   Lab Results  Component Value Date   ALT 10 03/11/2019   AST 15 03/11/2019   ALKPHOS 113 03/11/2019   BILITOT 0.3 03/11/2019   Lab Results  Component Value Date   HGBA1C 5.3 03/11/2019   HGBA1C 5.3 09/27/2017   HGBA1C 5.2 11/12/2014   No results found for: INSULIN Lab Results  Component Value Date   TSH 1.820 09/11/2019   Lab Results  Component Value Date   CHOL 204 (H) 03/11/2019   HDL 61 03/11/2019   LDLCALC 129 (H) 03/11/2019   TRIG 69 03/11/2019   CHOLHDL 3.3 03/11/2019   Lab Results  Component  Value Date   WBC 4.9 03/11/2019   HGB 13.5 03/11/2019   HCT 40.9 03/11/2019   MCV 81 03/11/2019   PLT 250 03/11/2019   No results found for: IRON, TIBC, FERRITIN  Attestation Statements:   Reviewed by clinician on day of visit: allergies, medications, problem list, medical history, surgical history, family history, social history, and previous encounter notes.  05/11/2019, am acting as Fernanda Drum for Energy manager, DO   I have reviewed the above documentation for accuracy and completeness, and I agree with the above. [employer], DO

## 2019-10-02 LAB — VITAMIN D 25 HYDROXY (VIT D DEFICIENCY, FRACTURES): Vit D, 25-Hydroxy: 37.6 ng/mL (ref 30.0–100.0)

## 2019-10-02 LAB — VITAMIN B12: Vitamin B-12: 278 pg/mL (ref 232–1245)

## 2019-10-02 LAB — INSULIN, RANDOM: INSULIN: 6.5 u[IU]/mL (ref 2.6–24.9)

## 2019-10-15 ENCOUNTER — Encounter (INDEPENDENT_AMBULATORY_CARE_PROVIDER_SITE_OTHER): Payer: Self-pay | Admitting: Bariatrics

## 2019-10-15 ENCOUNTER — Other Ambulatory Visit: Payer: Self-pay

## 2019-10-15 ENCOUNTER — Ambulatory Visit (INDEPENDENT_AMBULATORY_CARE_PROVIDER_SITE_OTHER): Payer: BC Managed Care – PPO | Admitting: Bariatrics

## 2019-10-15 VITALS — BP 101/67 | HR 72 | Temp 97.9°F | Ht 66.0 in | Wt 210.0 lb

## 2019-10-15 DIAGNOSIS — E039 Hypothyroidism, unspecified: Secondary | ICD-10-CM | POA: Diagnosis not present

## 2019-10-15 DIAGNOSIS — E669 Obesity, unspecified: Secondary | ICD-10-CM | POA: Diagnosis not present

## 2019-10-15 DIAGNOSIS — E559 Vitamin D deficiency, unspecified: Secondary | ICD-10-CM

## 2019-10-15 DIAGNOSIS — E66811 Obesity, class 1: Secondary | ICD-10-CM

## 2019-10-15 DIAGNOSIS — Z9189 Other specified personal risk factors, not elsewhere classified: Secondary | ICD-10-CM

## 2019-10-15 DIAGNOSIS — Z6834 Body mass index (BMI) 34.0-34.9, adult: Secondary | ICD-10-CM

## 2019-10-15 MED ORDER — VITAMIN D (ERGOCALCIFEROL) 1.25 MG (50000 UNIT) PO CAPS: 50000.0000 [IU] | ORAL_CAPSULE | ORAL | 0 refills | Status: DC

## 2019-10-15 NOTE — Progress Notes (Signed)
Chief Complaint:   OBESITY Karen Gross is here to discuss her progress with her obesity treatment plan along with follow-up of her obesity related diagnoses. Karen Gross is on the Category 2 Plan and states she is following her eating plan approximately 100% of the time. Wilmer states she is walking 2.5 miles 4-5 times per week.  Today's visit was #: 2 Starting weight: 217 lbs Starting date: 10/01/2019 Today's weight: 210 lbs Today's date: 10/15/2019 Total lbs lost to date: 7 Total lbs lost since last in-office visit: 7  Interim History: Karen Gross is down 7 lbs and doing well overall. She did not always get in her snacks. She does drink adequate water.  Subjective:   Vitamin D deficiency. Last Vitamin D 37.6 on 10/01/2019.  Hypothyroidism, unspecified type. Karen Gross is taking Synthroid and is stable.   Lab Results  Component Value Date   TSH 1.820 09/11/2019   At risk for osteoporosis. Karen Gross is at higher risk of osteopenia and osteoporosis due to Vitamin D deficiency and obesity.   Assessment/Plan:   Vitamin D deficiency. Low Vitamin D level contributes to fatigue and are associated with obesity, breast, and colon cancer. She was given a prescription for Vitamin D, Ergocalciferol, (DRISDOL) 1.25 MG (50000 UNIT) CAPS capsule every week #4 with 0 refills and will follow-up for routine testing of Vitamin D, at least 2-3 times per year to avoid over-replacement.    Hypothyroidism, unspecified type. Patient with long-standing hypothyroidism, on levothyroxine therapy. She appears euthyroid. Orders and follow up as documented in patient record. Karen Gross will continue her Synthroid as directed.  Counseling . Good thyroid control is important for overall health. Supratherapeutic thyroid levels are dangerous and will not improve weight loss results. . The correct way to take levothyroxine is fasting, with water, separated by at least 30 minutes from breakfast, and separated by more  than 4 hours from calcium, iron, multivitamins, acid reflux medications (PPIs).   At risk for osteoporosis. Karen Gross was given approximately 15 minutes of osteoporosis prevention counseling today. Karen Gross is at risk for osteopenia and osteoporosis due to her Vitamin D deficiency. She was encouraged to take her Vitamin D and follow her higher calcium diet and increase strengthening exercise to help strengthen her bones and decrease her risk of osteopenia and osteoporosis.  Repetitive spaced learning was employed today to elicit superior memory formation and behavioral change.  Class 1 obesity with serious comorbidity and body mass index (BMI) of 34.0 to 34.9 in adult, unspecified obesity type.  Karen Gross is currently in the action stage of change. As such, her goal is to continue with weight loss efforts. She has agreed to the Category 2 Plan with additional lunch and dinner options.  She will work on meal planning and mindful eating. She was given handout for "[employer]."  We independently reviewed labs from 10/01/2019 with the patient including Vitamin D, A1c, and insulin.  Exercise goals: All adults should avoid inactivity. Some physical activity is better than none, and adults who participate in any amount of physical activity gain some health benefits.  Behavioral modification strategies: increasing lean protein intake, decreasing simple carbohydrates, increasing vegetables, increasing water intake, decreasing eating out, no skipping meals, meal planning and cooking strategies, keeping healthy foods in the home and planning for success.  Karen Gross has agreed to follow-up with our clinic in 2 weeks. She was informed of the importance of frequent follow-up visits to maximize her success with intensive lifestyle modifications for her multiple health  conditions.   Objective:   Blood pressure 101/67, pulse 72, temperature 97.9 F (36.6 C), height 5\' 6"  (1.676 m), weight 210 lb (95.3 kg), SpO2 96  %. Body mass index is 33.89 kg/m.  General: Cooperative, alert, well developed, in no acute distress. HEENT: Conjunctivae and lids unremarkable. Cardiovascular: Regular rhythm.  Lungs: Normal work of breathing. Neurologic: No focal deficits.   Lab Results  Component Value Date   CREATININE 0.92 03/11/2019   BUN 18 03/11/2019   NA 142 03/11/2019   K 4.1 03/11/2019   CL 109 (H) 03/11/2019   CO2 20 03/11/2019   Lab Results  Component Value Date   ALT 10 03/11/2019   AST 15 03/11/2019   ALKPHOS 113 03/11/2019   BILITOT 0.3 03/11/2019   Lab Results  Component Value Date   HGBA1C 5.3 03/11/2019   HGBA1C 5.3 09/27/2017   HGBA1C 5.2 11/12/2014   Lab Results  Component Value Date   INSULIN 6.5 10/01/2019   Lab Results  Component Value Date   TSH 1.820 09/11/2019   Lab Results  Component Value Date   CHOL 204 (H) 03/11/2019   HDL 61 03/11/2019   LDLCALC 129 (H) 03/11/2019   TRIG 69 03/11/2019   CHOLHDL 3.3 03/11/2019   Lab Results  Component Value Date   WBC 4.9 03/11/2019   HGB 13.5 03/11/2019   HCT 40.9 03/11/2019   MCV 81 03/11/2019   PLT 250 03/11/2019   No results found for: IRON, TIBC, FERRITIN  Attestation Statements:   Reviewed by clinician on day of visit: allergies, medications, problem list, medical history, surgical history, family history, social history, and previous encounter notes.  Migdalia Dk, am acting as Location manager for [employer], DO   I have reviewed the above documentation for accuracy and completeness, and I agree with the above. Jearld Lesch, DO

## 2019-11-01 ENCOUNTER — Other Ambulatory Visit: Payer: Self-pay | Admitting: Adult Health

## 2019-11-03 ENCOUNTER — Other Ambulatory Visit: Payer: Self-pay | Admitting: Family Medicine

## 2019-11-03 ENCOUNTER — Ambulatory Visit (INDEPENDENT_AMBULATORY_CARE_PROVIDER_SITE_OTHER): Payer: BC Managed Care – PPO | Admitting: Physician Assistant

## 2019-11-03 ENCOUNTER — Encounter (INDEPENDENT_AMBULATORY_CARE_PROVIDER_SITE_OTHER): Payer: Self-pay | Admitting: Physician Assistant

## 2019-11-03 ENCOUNTER — Other Ambulatory Visit: Payer: Self-pay

## 2019-11-03 VITALS — BP 99/66 | HR 68 | Temp 97.9°F | Ht 66.0 in | Wt 206.0 lb

## 2019-11-03 DIAGNOSIS — Z9189 Other specified personal risk factors, not elsewhere classified: Secondary | ICD-10-CM

## 2019-11-03 DIAGNOSIS — E669 Obesity, unspecified: Secondary | ICD-10-CM

## 2019-11-03 DIAGNOSIS — E039 Hypothyroidism, unspecified: Secondary | ICD-10-CM | POA: Diagnosis not present

## 2019-11-03 DIAGNOSIS — E559 Vitamin D deficiency, unspecified: Secondary | ICD-10-CM

## 2019-11-03 DIAGNOSIS — Z6833 Body mass index (BMI) 33.0-33.9, adult: Secondary | ICD-10-CM

## 2019-11-03 MED ORDER — VITAMIN D (ERGOCALCIFEROL) 1.25 MG (50000 UNIT) PO CAPS: 50000.0000 [IU] | ORAL_CAPSULE | ORAL | 0 refills | Status: DC

## 2019-11-03 NOTE — Progress Notes (Signed)
Chief Complaint:   OBESITY Karen Gross is here to discuss her progress with her obesity treatment plan along with follow-up of her obesity related diagnoses. Karen Gross is on the Category 2 Plan and states she is following her eating plan approximately 99% of the time. Karen Gross states she is walking 2.5 miles 5 times per week.  Today's visit was #: 3 Starting weight: 217 lbs Starting date: 10/01/2019 Today's weight: 206 lbs Today's date: 11/03/2019 Total lbs lost to date: 11 lbs Total lbs lost since last in-office visit: 4 lbs  Interim History: Karen Gross is enjoying the plan and doing well.  She states that she is not always getting all of her protein at lunch, but is eating more at dinner.  Subjective:   1. Vitamin D deficiency Karen Gross's Vitamin D level was 37.6 on 10/01/2019. She is currently taking prescription vitamin D 50,000 IU each week. She denies nausea, vomiting or muscle weakness.  2. Hypothyroidism, unspecified type She is taking levothyroxine 112 mcg daily.  No heat/cold intolerance.   Lab Results  Component Value Date   TSH 1.820 09/11/2019   3. At risk for osteoporosis Karen Gross is at higher risk of osteopenia and osteoporosis due to Vitamin D deficiency.   Assessment/Plan:   1. Vitamin D deficiency Low Vitamin D level contributes to fatigue and are associated with obesity, breast, and colon cancer. She agrees to continue to take prescription Vitamin D @50 ,000 IU every week and will follow-up for routine testing of Vitamin D, at least 2-3 times per year to avoid over-replacement. - Vitamin D, Ergocalciferol, (DRISDOL) 1.25 MG (50000 UNIT) CAPS capsule; Take 1 capsule (50,000 Units total) by mouth every 7 (seven) days.  Dispense: 4 capsule; Refill: 0  2. Hypothyroidism, unspecified type Patient with long-standing hypothyroidism, on levothyroxine therapy. She appears euthyroid. Orders and follow up as documented in patient record.  Counseling . Good thyroid control is  important for overall health. Supratherapeutic thyroid levels are dangerous and will not improve weight loss results. . The correct way to take levothyroxine is fasting, with water, separated by at least 30 minutes from breakfast, and separated by more than 4 hours from calcium, iron, multivitamins, acid reflux medications (PPIs).   3. At risk for osteoporosis Karen Gross was given approximately 15 minutes of osteoporosis prevention counseling today. Karen Gross is at risk for osteopenia and osteoporosis due to her Vitamin D deficiency. She was encouraged to take her Vitamin D and follow her higher calcium diet and increase strengthening exercise to help strengthen her bones and decrease her risk of osteopenia and osteoporosis.  Repetitive spaced learning was employed today to elicit superior memory formation and behavioral change.  4. Class 1 obesity with serious comorbidity and body mass index (BMI) of 33.0 to 33.9 in adult, unspecified obesity type Karen Gross is currently in the action stage of change. As such, her goal is to continue with weight loss efforts. She has agreed to the Category 2 Plan.   Exercise goals: As is.  Behavioral modification strategies: increasing lean protein intake and planning for success.  Karen Gross has agreed to follow-up with our clinic in 2 weeks. She was informed of the importance of frequent follow-up visits to maximize her success with intensive lifestyle modifications for her multiple health conditions.   Objective:   Blood pressure 99/66, pulse 68, temperature 97.9 F (36.6 C), temperature source Oral, height 5\' 6"  (1.676 m), weight 206 lb (93.4 kg), SpO2 100 %. Body mass index is 33.25 kg/m.  General: Cooperative, alert,  well developed, in no acute distress. HEENT: Conjunctivae and lids unremarkable. Cardiovascular: Regular rhythm.  Lungs: Normal work of breathing. Neurologic: No focal deficits.   Lab Results  Component Value Date   CREATININE 0.92 03/11/2019    BUN 18 03/11/2019   NA 142 03/11/2019   K 4.1 03/11/2019   CL 109 (H) 03/11/2019   CO2 20 03/11/2019   Lab Results  Component Value Date   ALT 10 03/11/2019   AST 15 03/11/2019   ALKPHOS 113 03/11/2019   BILITOT 0.3 03/11/2019   Lab Results  Component Value Date   HGBA1C 5.3 03/11/2019   HGBA1C 5.3 09/27/2017   HGBA1C 5.2 11/12/2014   Lab Results  Component Value Date   INSULIN 6.5 10/01/2019   Lab Results  Component Value Date   TSH 1.820 09/11/2019   Lab Results  Component Value Date   CHOL 204 (H) 03/11/2019   HDL 61 03/11/2019   LDLCALC 129 (H) 03/11/2019   TRIG 69 03/11/2019   CHOLHDL 3.3 03/11/2019   Lab Results  Component Value Date   WBC 4.9 03/11/2019   HGB 13.5 03/11/2019   HCT 40.9 03/11/2019   MCV 81 03/11/2019   PLT 250 03/11/2019   Attestation Statements:   Reviewed by clinician on day of visit: allergies, medications, problem list, medical history, surgical history, family history, social history, and previous encounter notes.  I, Water quality scientist, CMA, am acting as Location manager for [employer], PA-C.  I have reviewed the above documentation for accuracy and completeness, and I agree with the above. Abby Potash, PA-C

## 2019-11-05 ENCOUNTER — Other Ambulatory Visit: Payer: Self-pay

## 2019-11-05 ENCOUNTER — Encounter: Payer: Self-pay | Admitting: Family Medicine

## 2019-11-05 ENCOUNTER — Other Ambulatory Visit: Payer: BC Managed Care – PPO

## 2019-11-05 ENCOUNTER — Ambulatory Visit (INDEPENDENT_AMBULATORY_CARE_PROVIDER_SITE_OTHER): Payer: BC Managed Care – PPO | Admitting: Family Medicine

## 2019-11-05 ENCOUNTER — Encounter: Payer: Self-pay | Admitting: Gastroenterology

## 2019-11-05 VITALS — BP 103/67 | HR 61 | Temp 97.5°F | Resp 12 | Ht 66.75 in | Wt 211.4 lb

## 2019-11-05 DIAGNOSIS — Z719 Counseling, unspecified: Secondary | ICD-10-CM | POA: Diagnosis not present

## 2019-11-05 DIAGNOSIS — E669 Obesity, unspecified: Secondary | ICD-10-CM | POA: Diagnosis not present

## 2019-11-05 DIAGNOSIS — E039 Hypothyroidism, unspecified: Secondary | ICD-10-CM

## 2019-11-05 DIAGNOSIS — R5383 Other fatigue: Secondary | ICD-10-CM

## 2019-11-05 DIAGNOSIS — Z1211 Encounter for screening for malignant neoplasm of colon: Secondary | ICD-10-CM | POA: Diagnosis not present

## 2019-11-05 DIAGNOSIS — Z6833 Body mass index (BMI) 33.0-33.9, adult: Secondary | ICD-10-CM

## 2019-11-05 DIAGNOSIS — Z0000 Encounter for general adult medical examination without abnormal findings: Secondary | ICD-10-CM

## 2019-11-05 DIAGNOSIS — Z6835 Body mass index (BMI) 35.0-35.9, adult: Secondary | ICD-10-CM

## 2019-11-05 DIAGNOSIS — E559 Vitamin D deficiency, unspecified: Secondary | ICD-10-CM

## 2019-11-05 NOTE — Patient Instructions (Addendum)
Please call your insurance regarding the Shingrix Shingles vaccine.  If you decide to obtain this vaccine, it will be a CMA only injection visit.     You can use 1/2 oxygen peroxide and 1/2 rubbing alcohol drops for ear wax.       Preventive Care for Adults, Female  A healthy lifestyle and preventive care can promote health and wellness. Preventive health guidelines for women include the following key practices.   A routine yearly physical is a good way to check with your health care provider about your health and preventive screening. It is a chance to share any concerns and updates on your health and to receive a thorough exam.   Visit your dentist for a routine exam and preventive care every 6 months. Brush your teeth twice a day and floss once a day. Good oral hygiene prevents tooth decay and gum disease.   The frequency of eye exams is based on your age, health, family medical history, use of contact lenses, and other factors. Follow your health care provider's recommendations for frequency of eye exams.   Eat a healthy diet. Foods like vegetables, fruits, whole grains, low-fat dairy products, and lean protein foods contain the nutrients you need without too many calories. Decrease your intake of foods high in solid fats, added sugars, and salt. Eat the right amount of calories for you.Get information about a proper diet from your health care provider, if necessary.   Regular physical exercise is one of the most important things you can do for your health. Most adults should get at least 150 minutes of moderate-intensity exercise (any activity that increases your heart rate and causes you to sweat) each week. In addition, most adults need muscle-strengthening exercises on 2 or more days a week.   Maintain a healthy weight. The body mass index (BMI) is a screening tool to identify possible weight problems. It provides an estimate of body fat based on height and weight. Your health  care provider can find your BMI, and can help you achieve or maintain a healthy weight.For adults 20 years and older:   - A BMI below 18.5 is considered underweight.   - A BMI of 18.5 to 24.9 is normal.   - A BMI of 25 to 29.9 is considered overweight.   - A BMI of 30 and above is considered obese.   Maintain normal blood lipids and cholesterol levels by exercising and minimizing your intake of trans and saturated fats.  Eat a balanced diet with plenty of fruit and vegetables. Blood tests for lipids and cholesterol should begin at age 88 and be repeated every 5 years minimum.  If your lipid or cholesterol levels are high, you are over 40, or you are at high risk for heart disease, you may need your cholesterol levels checked more frequently.Ongoing high lipid and cholesterol levels should be treated with medicines if diet and exercise are not working.   If you smoke, find out from your health care provider how to quit. If you do not use tobacco, do not start.   Lung cancer screening is recommended for adults aged 73-80 years who are at high risk for developing lung cancer because of a history of smoking. A yearly low-dose CT scan of the lungs is recommended for people who have at least a 30-pack-year history of smoking and are a current smoker or have quit within the past 15 years. A pack year of smoking is smoking an average of 1 pack  of cigarettes a day for 1 year (for example: 1 pack a day for 30 years or 2 packs a day for 15 years). Yearly screening should continue until the smoker has stopped smoking for at least 15 years. Yearly screening should be stopped for people who develop a health problem that would prevent them from having lung cancer treatment.   If you are pregnant, do not drink alcohol. If you are breastfeeding, be very cautious about drinking alcohol. If you are not pregnant and choose to drink alcohol, do not have more than 1 drink per day. One drink is considered to be 12 ounces  (355 mL) of beer, 5 ounces (148 mL) of wine, or 1.5 ounces (44 mL) of liquor.   Avoid use of street drugs. Do not share needles with anyone. Ask for help if you need support or instructions about stopping the use of drugs.   High blood pressure causes heart disease and increases the risk of stroke. Your blood pressure should be checked at least yearly.  Ongoing high blood pressure should be treated with medicines if weight loss and exercise do not work.   If you are 63-59 years old, ask your health care provider if you should take aspirin to prevent strokes.   Diabetes screening involves taking a blood sample to check your fasting blood sugar level. This should be done once every 3 years, after age 56, if you are within normal weight and without risk factors for diabetes. Testing should be considered at a younger age or be carried out more frequently if you are overweight and have at least 1 risk factor for diabetes.   Breast cancer screening is essential preventive care for women. You should practice "breast self-awareness."  This means understanding the normal appearance and feel of your breasts and may include breast self-examination.  Any changes detected, no matter how small, should be reported to a health care provider.  Women in their 81s and 30s should have a clinical breast exam (CBE) by a health care provider as part of a regular health exam every 1 to 3 years.  After age 82, women should have a CBE every year.  Starting at age 52, women should consider having a mammogram (breast X-ray test) every year.  Women who have a family history of breast cancer should talk to their health care provider about genetic screening.  Women at a high risk of breast cancer should talk to their health care providers about having an MRI and a mammogram every year.   -Breast cancer gene (BRCA)-related cancer risk assessment is recommended for women who have family members with BRCA-related cancers.  BRCA-related cancers include breast, ovarian, tubal, and peritoneal cancers. Having family members with these cancers may be associated with an increased risk for harmful changes (mutations) in the breast cancer genes BRCA1 and BRCA2. Results of the assessment will determine the need for genetic counseling and BRCA1 and BRCA2 testing.   The Pap test is a screening test for cervical cancer. A Pap test can show cell changes on the cervix that might become cervical cancer if left untreated. A Pap test is a procedure in which cells are obtained and examined from the lower end of the uterus (cervix).   - Women should have a Pap test starting at age 36.   - Between ages 67 and 33, Pap tests should be repeated every 2 years.   - Beginning at age 33, you should have a Pap test every 3 years  as long as the past 3 Pap tests have been normal.   - Some women have medical problems that increase the chance of getting cervical cancer. Talk to your health care provider about these problems. It is especially important to talk to your health care provider if a new problem develops soon after your last Pap test. In these cases, your health care provider may recommend more frequent screening and Pap tests.   - The above recommendations are the same for women who have or have not gotten the vaccine for human papillomavirus (HPV).   - If you had a hysterectomy for a problem that was not cancer or a condition that could lead to cancer, then you no longer need Pap tests. Even if you no longer need a Pap test, a regular exam is a good idea to make sure no other problems are starting.   - If you are between ages 66 and 73 years, and you have had normal Pap tests going back 10 years, you no longer need Pap tests. Even if you no longer need a Pap test, a regular exam is a good idea to make sure no other problems are starting.   - If you have had past treatment for cervical cancer or a condition that could lead to cancer, you  need Pap tests and screening for cancer for at least 20 years after your treatment.   - If Pap tests have been discontinued, risk factors (such as a new sexual partner) need to be reassessed to determine if screening should be resumed.   - The HPV test is an additional test that may be used for cervical cancer screening. The HPV test looks for the virus that can cause the cell changes on the cervix. The cells collected during the Pap test can be tested for HPV. The HPV test could be used to screen women aged 1 years and older, and should be used in women of any age who have unclear Pap test results. After the age of 69, women should have HPV testing at the same frequency as a Pap test.   Colorectal cancer can be detected and often prevented. Most routine colorectal cancer screening begins at the age of 51 years and continues through age 6 years. However, your health care provider may recommend screening at an earlier age if you have risk factors for colon cancer. On a yearly basis, your health care provider may provide home test kits to check for hidden blood in the stool.  Use of a small camera at the end of a tube, to directly examine the colon (sigmoidoscopy or colonoscopy), can detect the earliest forms of colorectal cancer. Talk to your health care provider about this at age 31, when routine screening begins. Direct exam of the colon should be repeated every 5 -10 years through age 16 years, unless early forms of pre-cancerous polyps or small growths are found.   People who are at an increased risk for hepatitis B should be screened for this virus. You are considered at high risk for hepatitis B if:  -You were born in a country where hepatitis B occurs often. Talk with your health care provider about which countries are considered high risk.  - Your parents were born in a high-risk country and you have not received a shot to protect against hepatitis B (hepatitis B vaccine).  - You have HIV or  AIDS.  - You use needles to inject street drugs.  - You live with,  or have sex with, someone who has Hepatitis B.  - You get hemodialysis treatment.  - You take certain medicines for conditions like cancer, organ transplantation, and autoimmune conditions.   Hepatitis C blood testing is recommended for all people born from [birth year] through 1963 and any individual with known risks for hepatitis C.   Practice safe sex. Use condoms and avoid high-risk sexual practices to reduce the spread of sexually transmitted infections (STIs). STIs include gonorrhea, chlamydia, syphilis, trichomonas, herpes, HPV, and human immunodeficiency virus (HIV). Herpes, HIV, and HPV are viral illnesses that have no cure. They can result in disability, cancer, and death. Sexually active women aged 21 years and younger should be checked for chlamydia. Older women with new or multiple partners should also be tested for chlamydia. Testing for other STIs is recommended if you are sexually active and at increased risk.   Osteoporosis is a disease in which the bones lose minerals and strength with aging. This can result in serious bone fractures or breaks. The risk of osteoporosis can be identified using a bone density scan. Women ages 64 years and over and women at risk for fractures or osteoporosis should discuss screening with their health care providers. Ask your health care provider whether you should take a calcium supplement or vitamin D to There are also several preventive steps women can take to avoid osteoporosis and resulting fractures or to keep osteoporosis from worsening. -->Recommendations include:  Eat a balanced diet high in fruits, vegetables, calcium, and vitamins.  Get enough calcium. The recommended total intake of is 1,200 mg daily; for best absorption, if taking supplements, divide doses into 250-500 mg doses throughout the day. Of the two types of calcium, calcium carbonate is best absorbed when taken with food  but calcium citrate can be taken on an empty stomach.  Get enough vitamin D. [employer] and the [facility] recommend at least 1,000 IU per day for women age 10 and over who are at risk of vitamin D deficiency. Vitamin D deficiency can be caused by inadequate sun exposure (for example, those who live in [place]).  Avoid alcohol and smoking. Heavy alcohol intake (more than 7 drinks per week) increases the risk of falls and hip fracture and women smokers tend to lose bone more rapidly and have lower bone mass than nonsmokers. Stopping smoking is one of the most important changes women can make to improve their health and decrease risk for disease.  Be physically active every day. Weight-bearing exercise (for example, fast walking, hiking, jogging, and weight training) may strengthen bones or slow the rate of bone loss that comes with aging. Balancing and muscle-strengthening exercises can reduce the risk of falling and fracture.  Consider therapeutic medications. Currently, several types of effective drugs are available. Healthcare providers can recommend the type most appropriate for each woman.  Eliminate environmental factors that may contribute to accidents. Falls cause nearly 90% of all osteoporotic fractures, so reducing this risk is an important bone-health strategy. Measures include ample lighting, removing obstructions to walking, using nonskid rugs on floors, and placing mats and/or grab bars in showers.  Be aware of medication side effects. Some common medicines make bones weaker. These include a type of steroid drug called glucocorticoids used for arthritis and asthma, some antiseizure drugs, certain sleeping pills, treatments for endometriosis, and some cancer drugs. An overactive thyroid gland or using too much thyroid hormone for an underactive thyroid can also be a problem. If you are taking these medicines,  talk to your doctor about what you can do to help protect  your bones.reduce the rate of osteoporosis.    Menopause can be associated with physical symptoms and risks. Hormone replacement therapy is available to decrease symptoms and risks. You should talk to your health care provider about whether hormone replacement therapy is right for you.   Use sunscreen. Apply sunscreen liberally and repeatedly throughout the day. You should seek shade when your shadow is shorter than you. Protect yourself by wearing long sleeves, pants, a wide-brimmed hat, and sunglasses year round, whenever you are outdoors.   Once a month, do a whole body skin exam, using a mirror to look at the skin on your back. Tell your health care provider of new moles, moles that have irregular borders, moles that are larger than a pencil eraser, or moles that have changed in shape or color.   -Stay current with required vaccines (immunizations).   Influenza vaccine. All adults should be immunized every year.  Tetanus, diphtheria, and acellular pertussis (Td, Tdap) vaccine. Pregnant women should receive 1 dose of Tdap vaccine during each pregnancy. The dose should be obtained regardless of the length of time since the last dose. Immunization is preferred during the 27th 36th week of gestation. An adult who has not previously received Tdap or who does not know her vaccine status should receive 1 dose of Tdap. This initial dose should be followed by tetanus and diphtheria toxoids (Td) booster doses every 10 years. Adults with an unknown or incomplete history of completing a 3-dose immunization series with Td-containing vaccines should begin or complete a primary immunization series including a Tdap dose. Adults should receive a Td booster every 10 years.  Varicella vaccine. An adult without evidence of immunity to varicella should receive 2 doses or a second dose if she has previously received 1 dose. Pregnant females who do not have evidence of immunity should receive the first dose after  pregnancy. This first dose should be obtained before leaving the health care facility. The second dose should be obtained 4 8 weeks after the first dose.  Human papillomavirus (HPV) vaccine. Females aged 54 26 years who have not received the vaccine previously should obtain the 3-dose series. The vaccine is not recommended for use in pregnant females. However, pregnancy testing is not needed before receiving a dose. If a female is found to be pregnant after receiving a dose, no treatment is needed. In that case, the remaining doses should be delayed until after the pregnancy. Immunization is recommended for any person with an immunocompromised condition through the age of 83 years if she did not get any or all doses earlier. During the 3-dose series, the second dose should be obtained 4 8 weeks after the first dose. The third dose should be obtained 24 weeks after the first dose and 16 weeks after the second dose.  Zoster vaccine. One dose is recommended for adults aged 1 years or older unless certain conditions are present.  Measles, mumps, and rubella (MMR) vaccine. Adults born before [birth year] generally are considered immune to measles and mumps. Adults born in [birth year] or later should have 1 or more doses of MMR vaccine unless there is a contraindication to the vaccine or there is laboratory evidence of immunity to each of the three diseases. A routine second dose of MMR vaccine should be obtained at least 28 days after the first dose for students attending postsecondary schools, health care workers, or international travelers. People who received inactivated  measles vaccine or an unknown type of measles vaccine during 1963 1967 should receive 2 doses of MMR vaccine. People who received inactivated mumps vaccine or an unknown type of mumps vaccine before 1979 and are at high risk for mumps infection should consider immunization with 2 doses of MMR vaccine. For females of childbearing age, rubella immunity should  be determined. If there is no evidence of immunity, females who are not pregnant should be vaccinated. If there is no evidence of immunity, females who are pregnant should delay immunization until after pregnancy. Unvaccinated health care workers born before [birth year] who lack laboratory evidence of measles, mumps, or rubella immunity or laboratory confirmation of disease should consider measles and mumps immunization with 2 doses of MMR vaccine or rubella immunization with 1 dose of MMR vaccine.  Pneumococcal 13-valent conjugate (PCV13) vaccine. When indicated, a person who is uncertain of her immunization history and has no record of immunization should receive the PCV13 vaccine. An adult aged 62 years or older who has certain medical conditions and has not been previously immunized should receive 1 dose of PCV13 vaccine. This PCV13 should be followed with a dose of pneumococcal polysaccharide (PPSV23) vaccine. The PPSV23 vaccine dose should be obtained at least 8 weeks after the dose of PCV13 vaccine. An adult aged 55 years or older who has certain medical conditions and previously received 1 or more doses of PPSV23 vaccine should receive 1 dose of PCV13. The PCV13 vaccine dose should be obtained 1 or more years after the last PPSV23 vaccine dose.  Pneumococcal polysaccharide (PPSV23) vaccine. When PCV13 is also indicated, PCV13 should be obtained first. All adults aged 30 years and older should be immunized. An adult younger than age 37 years who has certain medical conditions should be immunized. Any person who resides in a nursing home or long-term care facility should be immunized. An adult smoker should be immunized. People with an immunocompromised condition and certain other conditions should receive both PCV13 and PPSV23 vaccines. People with human immunodeficiency virus (HIV) infection should be immunized as soon as possible after diagnosis. Immunization during chemotherapy or radiation therapy should be  avoided. Routine use of PPSV23 vaccine is not recommended for American Indians, [place]atives, or people younger than 65 years unless there are medical conditions that require PPSV23 vaccine. When indicated, people who have unknown immunization and have no record of immunization should receive PPSV23 vaccine. One-time revaccination 5 years after the first dose of PPSV23 is recommended for people aged 75 64 years who have chronic kidney failure, nephrotic syndrome, asplenia, or immunocompromised conditions. People who received 1 2 doses of PPSV23 before age 87 years should receive another dose of PPSV23 vaccine at age 18 years or later if at least 5 years have passed since the previous dose. Doses of PPSV23 are not needed for people immunized with PPSV23 at or after age 35 years.  Meningococcal vaccine. Adults with asplenia or persistent complement component deficiencies should receive 2 doses of quadrivalent meningococcal conjugate (MenACWY-D) vaccine. The doses should be obtained at least 2 months apart. Microbiologists working with certain meningococcal bacteria, [employer] recruits, people at risk during an outbreak, and people who travel to or live in countries with a high rate of meningitis should be immunized. A first-year college student up through age 69 years who is living in a residence hall should receive a dose if she did not receive a dose on or after her 16th birthday. Adults who have certain high-risk conditions should receive one  or more doses of vaccine.  Hepatitis A vaccine. Adults who wish to be protected from this disease, have certain high-risk conditions, work with hepatitis A-infected animals, work in hepatitis A research labs, or travel to or work in countries with a high rate of hepatitis A should be immunized. Adults who were previously unvaccinated and who anticipate close contact with an international adoptee during the first 60 days after arrival in the [place] from a country  with a high rate of hepatitis A should be immunized.  Hepatitis B vaccine.  Adults who wish to be protected from this disease, have certain high-risk conditions, may be exposed to blood or other infectious body fluids, are household contacts or sex partners of hepatitis B positive people, are clients or workers in certain care facilities, or travel to or work in countries with a high rate of hepatitis B should be immunized.  Haemophilus influenzae type b (Hib) vaccine. A previously unvaccinated person with asplenia or sickle cell disease or having a scheduled splenectomy should receive 1 dose of Hib vaccine. Regardless of previous immunization, a recipient of a hematopoietic stem cell transplant should receive a 3-dose series 6 12 months after her successful transplant. Hib vaccine is not recommended for adults with HIV infection.  Preventive Services / Frequency Ages 42 to 39years  Blood pressure check.** / Every 1 to 2 years.  Lipid and cholesterol check.** / Every 5 years beginning at age 26.  Clinical breast exam.** / Every 3 years for women in their 32s and 39s.  BRCA-related cancer risk assessment.** / For women who have family members with a BRCA-related cancer (breast, ovarian, tubal, or peritoneal cancers).  Pap test.** / Every 2 years from ages 25 through 60. Every 3 years starting at age 2 through age 42 or 46 with a history of 3 consecutive normal Pap tests.  HPV screening.** / Every 3 years from ages 58 through ages 19 to 76 with a history of 3 consecutive normal Pap tests.  Hepatitis C blood test.** / For any individual with known risks for hepatitis C.  Skin self-exam. / Monthly.  Influenza vaccine. / Every year.  Tetanus, diphtheria, and acellular pertussis (Tdap, Td) vaccine.** / Consult your health care provider. Pregnant women should receive 1 dose of Tdap vaccine during each pregnancy. 1 dose of Td every 10 years.  Varicella vaccine.** / Consult your health care  provider. Pregnant females who do not have evidence of immunity should receive the first dose after pregnancy.  HPV vaccine. / 3 doses over 6 months, if 41 and younger. The vaccine is not recommended for use in pregnant females. However, pregnancy testing is not needed before receiving a dose.  Measles, mumps, rubella (MMR) vaccine.** / You need at least 1 dose of MMR if you were born in 1957 or later. You may also need a 2nd dose. For females of childbearing age, rubella immunity should be determined. If there is no evidence of immunity, females who are not pregnant should be vaccinated. If there is no evidence of immunity, females who are pregnant should delay immunization until after pregnancy.  Pneumococcal 13-valent conjugate (PCV13) vaccine.** / Consult your health care provider.  Pneumococcal polysaccharide (PPSV23) vaccine.** / 1 to 2 doses if you smoke cigarettes or if you have certain conditions.  Meningococcal vaccine.** / 1 dose if you are age 68 to 41 years and a Market researcher living in a residence hall, or have one of several medical conditions, you need to get  vaccinated against meningococcal disease. You may also need additional booster doses.  Hepatitis A vaccine.** / Consult your health care provider.  Hepatitis B vaccine.** / Consult your health care provider.  Haemophilus influenzae type b (Hib) vaccine.** / Consult your health care provider.  Ages 32 to 64years  Blood pressure check.** / Every 1 to 2 years.  Lipid and cholesterol check.** / Every 5 years beginning at age 2 years.  Lung cancer screening. / Every year if you are aged 36 80 years and have a 30-pack-year history of smoking and currently smoke or have quit within the past 15 years. Yearly screening is stopped once you have quit smoking for at least 15 years or develop a health problem that would prevent you from having lung cancer treatment.  Clinical breast exam.** / Every year after age 93  years.  BRCA-related cancer risk assessment.** / For women who have family members with a BRCA-related cancer (breast, ovarian, tubal, or peritoneal cancers).  Mammogram.** / Every year beginning at age 40 years and continuing for as long as you are in good health. Consult with your health care provider.  Pap test.** / Every 3 years starting at age 42 years through age 26 or 53 years with a history of 3 consecutive normal Pap tests.  HPV screening.** / Every 3 years from ages 51 years through ages 55 to 2 years with a history of 3 consecutive normal Pap tests.  Fecal occult blood test (FOBT) of stool. / Every year beginning at age 52 years and continuing until age 69 years. You may not need to do this test if you get a colonoscopy every 10 years.  Flexible sigmoidoscopy or colonoscopy.** / Every 5 years for a flexible sigmoidoscopy or every 10 years for a colonoscopy beginning at age 68 years and continuing until age 25 years.  Hepatitis C blood test.** / For all people born from [birth year] through 1963 and any individual with known risks for hepatitis C.  Skin self-exam. / Monthly.  Influenza vaccine. / Every year.  Tetanus, diphtheria, and acellular pertussis (Tdap/Td) vaccine.** / Consult your health care provider. Pregnant women should receive 1 dose of Tdap vaccine during each pregnancy. 1 dose of Td every 10 years.  Varicella vaccine.** / Consult your health care provider. Pregnant females who do not have evidence of immunity should receive the first dose after pregnancy.  Zoster vaccine.** / 1 dose for adults aged 18 years or older.  Measles, mumps, rubella (MMR) vaccine.** / You need at least 1 dose of MMR if you were born in 1957 or later. You may also need a 2nd dose. For females of childbearing age, rubella immunity should be determined. If there is no evidence of immunity, females who are not pregnant should be vaccinated. If there is no evidence of immunity, females who are  pregnant should delay immunization until after pregnancy.  Pneumococcal 13-valent conjugate (PCV13) vaccine.** / Consult your health care provider.  Pneumococcal polysaccharide (PPSV23) vaccine.** / 1 to 2 doses if you smoke cigarettes or if you have certain conditions.  Meningococcal vaccine.** / Consult your health care provider.  Hepatitis A vaccine.** / Consult your health care provider.  Hepatitis B vaccine.** / Consult your health care provider.  Haemophilus influenzae type b (Hib) vaccine.** / Consult your health care provider.  Ages 93 years and over  Blood pressure check.** / Every 1 to 2 years.  Lipid and cholesterol check.** / Every 5 years beginning at age 59 years.  Lung  cancer screening. / Every year if you are aged 47 80 years and have a 30-pack-year history of smoking and currently smoke or have quit within the past 15 years. Yearly screening is stopped once you have quit smoking for at least 15 years or develop a health problem that would prevent you from having lung cancer treatment.  Clinical breast exam.** / Every year after age 27 years.  BRCA-related cancer risk assessment.** / For women who have family members with a BRCA-related cancer (breast, ovarian, tubal, or peritoneal cancers).  Mammogram.** / Every year beginning at age 30 years and continuing for as long as you are in good health. Consult with your health care provider.  Pap test.** / Every 3 years starting at age 39 years through age 50 or 39 years with 3 consecutive normal Pap tests. Testing can be stopped between 65 and 70 years with 3 consecutive normal Pap tests and no abnormal Pap or HPV tests in the past 10 years.  HPV screening.** / Every 3 years from ages 9 years through ages 67 or 81 years with a history of 3 consecutive normal Pap tests. Testing can be stopped between 65 and 70 years with 3 consecutive normal Pap tests and no abnormal Pap or HPV tests in the past 10 years.  Fecal occult  blood test (FOBT) of stool. / Every year beginning at age 15 years and continuing until age 73 years. You may not need to do this test if you get a colonoscopy every 10 years.  Flexible sigmoidoscopy or colonoscopy.** / Every 5 years for a flexible sigmoidoscopy or every 10 years for a colonoscopy beginning at age 24 years and continuing until age 27 years.  Hepatitis C blood test.** / For all people born from [birth year] through 1963 and any individual with known risks for hepatitis C.  Osteoporosis screening.** / A one-time screening for women ages 45 years and over and women at risk for fractures or osteoporosis.  Skin self-exam. / Monthly.  Influenza vaccine. / Every year.  Tetanus, diphtheria, and acellular pertussis (Tdap/Td) vaccine.** / 1 dose of Td every 10 years.  Varicella vaccine.** / Consult your health care provider.  Zoster vaccine.** / 1 dose for adults aged 8 years or older.  Pneumococcal 13-valent conjugate (PCV13) vaccine.** / Consult your health care provider.  Pneumococcal polysaccharide (PPSV23) vaccine.** / 1 dose for all adults aged 39 years and older.  Meningococcal vaccine.** / Consult your health care provider.  Hepatitis A vaccine.** / Consult your health care provider.  Hepatitis B vaccine.** / Consult your health care provider.  Haemophilus influenzae type b (Hib) vaccine.** / Consult your health care provider. ** Family history and personal history of risk and conditions may change your health care provider's recommendations. Document Released: 09/12/2001 Document Revised: 05/07/2013  [facility] Patient Information 2014 [place], [place].   EXERCISE AND DIET:  We recommended that you start or continue a regular exercise program for good health. Regular exercise means any activity that makes your heart beat faster and makes you sweat.  We recommend exercising at least 30 minutes per day at least 3 days a week, preferably 5.  We also recommend a diet low in fat  and sugar / carbohydrates.  Inactivity, poor dietary choices and obesity can cause diabetes, heart attack, stroke, and kidney damage, among others.     ALCOHOL AND SMOKING:  Women should limit their alcohol intake to no more than 7 drinks/beers/glasses of wine (combined, not each!) per week. Moderation of  alcohol intake to this level decreases your risk of breast cancer and liver damage.  ( And of course, no recreational drugs are part of a healthy lifestyle.)  Also, you should not be smoking at all or even being exposed to second hand smoke. Most people know smoking can cause cancer, and various heart and lung diseases, but did you know it also contributes to weakening of your bones?  Aging of your skin?  Yellowing of your teeth and nails?   CALCIUM AND VITAMIN D:  Adequate intake of calcium and Vitamin D are recommended.  The recommendations for exact amounts of these supplements seem to change often, but generally speaking 600 mg of calcium (either carbonate or citrate) and 800 units of Vitamin D per day seems prudent. Certain women may benefit from higher intake of Vitamin D.  If you are among these women, your doctor will have told you during your visit.     PAP SMEARS:  Pap smears, to check for cervical cancer or precancers,  have traditionally been done yearly, although recent scientific advances have shown that most women can have pap smears less often.  However, every woman still should have a physical exam from her gynecologist or primary care physician every year. It will include a breast check, inspection of the vulva and vagina to check for abnormal growths or skin changes, a visual exam of the cervix, and then an exam to evaluate the size and shape of the uterus and ovaries.  And after 52 years of age, a rectal exam is indicated to check for rectal cancers. We will also provide age appropriate advice regarding health maintenance, like when you should have certain vaccines, screening for  sexually transmitted diseases, bone density testing, colonoscopy, mammograms, etc.    MAMMOGRAMS:  All women over 11 years old should have a yearly mammogram. Many facilities now offer a "3D" mammogram, which may cost around $50 extra out of pocket. If possible,  we recommend you accept the option to have the 3D mammogram performed.  It both reduces the number of women who will be called back for extra views which then turn out to be normal, and it is better than the routine mammogram at detecting truly abnormal areas.     COLONOSCOPY:  Colonoscopy to screen for colon cancer is recommended for all women at age 17.  We know, you hate the idea of the prep.  We agree, BUT, having colon cancer and not knowing it is worse!!  Colon cancer so often starts as a polyp that can be seen and removed at colonscopy, which can quite literally save your life!  And if your first colonoscopy is normal and you have no family history of colon cancer, most women don't have to have it again for 10 years.  Once every ten years, you can do something that may end up saving your life, right?  We will be happy to help you get it scheduled when you are ready.  Be sure to check your insurance coverage so you understand how much it will cost.  It may be covered as a preventative service at no cost, but you should check your particular policy.

## 2019-11-05 NOTE — Progress Notes (Signed)
Female Physical  Impression and Recommendations:    1. Encounter for wellness examination   2. Screening for colon cancer   3. Health education/counseling   4. Class 1 obesity with serious comorbidity and body mass index (BMI) of 33.0 to 33.9 in adult, unspecified obesity type     Of note, this is my first time meeting patient.  Patient is new to me and was previously being cared for at our office by an NP, who no longer works at [facility].   Will be seen by Lorrene Reid, PA-C in future.   1) Anticipatory Guidance: Discussed skin CA prevention and sunscreen when outside along with skin surveillance; eating a balanced and modest diet; physical activity at least 25 minutes per day or minimum of 150 min/ week moderate to intense activity.  - For ear cleaning, advised patient to mix one half hydrogen peroxide, one half rubbing alcohol, and rinse her ears out with this mixture twice per week.  2) Immunizations / Screenings / Labs:   All immunizations are up-to-date per recommendations or will be updated today if pt allows.    - Patient understands with dental and vision screens they will schedule independently.  - Will obtain CBC, CMP, HgA1c, Lipid panel, TSH and vit D when fasting, if not already done past 12 mo/ recently   - Last mammogram on record obtained 06/10/2018.  Need for repeat.  - Need for first colonoscopy.  - Need for shingles vaccination.  Education provided to patient today.  3) Weight - Body mass index is 33.36 kg/m: BMI meaning discussed with patient.  Discussed goal to improve diet habits to improve overall feelings of well being and objective health data. Improve nutrient density of diet through increasing intake of fruits and vegetables and decreasing saturated fats, white flour products and refined sugars.  - Advised patient to continue working toward exercising to improve overall mental, physical, and emotional health.    - Encouraged  patient to engage in daily physical activity, especially a formal exercise routine.  Recommended that the patient eventually strive for at least 150 minutes of moderate cardiovascular activity per week according to guidelines established by the [facility].   - Patient should also consume adequate amounts of water.  - Health counseling performed.  All questions answered.   Orders Placed This Encounter  Procedures  . Ambulatory referral to Gastroenterology    Return for Follow-up 4-6 months for chronic disease management OR sooner if problems.    Reminded pt important of f-up preventative CPE in 1 year.  Reminded pt again, this is in addition to any chronic care visits.    Gross side effects, risk and benefits, and alternatives of medications discussed with patient.  Patient is aware that all medications have potential side effects and we are unable to predict every side effect or drug-drug interaction that may occur.  Expresses verbal understanding and consents to current therapy plan and treatment regimen.  F-up preventative CPE in 1 year- reminded pt again, this is in addition to any chronic care visits.    Please see orders placed and AVS handed out to patient at the end of our visit for further patient instructions/ counseling done pertaining to today's office visit.     Subjective:      CPE HPI: Karen Gross is a 54 y.o. female who presents to [place] at [facility] today a yearly health maintenance exam.   Health Maintenance Summary  -  Reviewed and updated, unless pt declines services.  Last Cologuard or Colonoscopy:  Need for first colonoscopy. Family history of Colon CA:  No.  Tobacco History Reviewed:  Y; never smoker.  Alcohol and/or drug use:    No concerns; no use Exercise Habits:   Not meeting AHA guidelines Dental Home:  Goes regularly, every six months. Eye exams:  Goes once yearly. Dermatology home:  Goes regularly to see Dr. Margo Aye, usually  yearly.  Has had moles taken off.  Female Health:  Follows up yearly with Dr. Mindi Slicker of OBGYN, went last week. PAP Smear - last known results:  no h/o ABN STD concerns:  None, monogamous Birth control method:  S/p hysterectomy in 2000. Menses regular:  S/p hysterectomy. Lumps or breast concerns:  Obtains mammograms at [facility]. Breast Cancer Family History:  Her mother had breast cancer, probably in her 14's.  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  Notes she has to have her ears flushed out often.  Patient denies health concerns today including constipation, diarrhea.  Denies urinary concerns.  Additional concerns beyond health maintenance issues:  Patient discussed her weight loss goals and has lost 11 lbs.   Immunization History  Administered Date(s) Administered  . Influenza Inj Mdck Quad Pf 04/23/2019  . Influenza-Unspecified 05/09/2017, 04/30/2018  . Tdap 12/19/2010     Health Maintenance  Topic Date Due  . COLONOSCOPY  Never done  . INFLUENZA VACCINE  02/29/2020  . MAMMOGRAM  06/10/2020  . TETANUS/TDAP  12/18/2020  . HIV Screening  Completed  . PAP SMEAR-Modifier  Discontinued     Wt Readings from Last 3 Encounters:  11/05/19 211 lb 6.4 oz (95.9 kg)  11/03/19 206 lb (93.4 kg)  10/15/19 210 lb (95.3 kg)   BP Readings from Last 3 Encounters:  11/05/19 103/67  11/03/19 99/66  10/15/19 101/67   Pulse Readings from Last 3 Encounters:  11/05/19 61  11/03/19 68  10/15/19 72     Past Medical History:  Diagnosis Date  . Anxiety   . Bartholin cyst   . Hip pain   . Hypothyroidism   . Knee pain   . Seizures (HCC)    Stress Induced      Past Surgical History:  Procedure Laterality Date  . ABDOMINAL HYSTERECTOMY    . KNEE ARTHROSCOPY Right       Family History  Problem Relation Age of Onset  . Breast cancer Mother   . Heart disease Mother     . High blood pressure Mother   . Cancer Mother   . Depression Mother   . Breast cancer Paternal Grandmother   . Colon cancer Neg Hx   . Stomach cancer Neg Hx       Social History   Substance and Sexual Activity  Drug Use No  ,   Social History   Substance and Sexual Activity  Alcohol Use Yes   Comment: once a month- wine  ,   Social History   Tobacco Use  Smoking Status Never Smoker  Smokeless Tobacco Never Used  ,   Social History   Substance and Sexual Activity  Sexual Activity Yes  . Birth control/protection: None    Current Outpatient Medications on File Prior to Visit  Medication Sig Dispense Refill  . ARIPiprazole (ABILIFY) 10 MG tablet TAKE 1 TABLET BY MOUTH DAILY. 90 tablet 0  . levothyroxine (SYNTHROID) 112  MCG tablet TAKE 1 TABLET BY MOUTH DAILY BEFORE BREAKFAST. 90 tablet 2  . omeprazole (PRILOSEC) 20 MG capsule Take 20 mg by mouth daily.    [name] topiramate (TOPAMAX) 100 MG tablet TAKE 1 TABLET BY MOUTH TWICE A DAY. 180 tablet 0  . Vitamin D, Ergocalciferol, (DRISDOL) 1.25 MG (50000 UNIT) CAPS capsule Take 1 capsule (50,000 Units total) by mouth every 7 (seven) days. 4 capsule 0   No current facility-administered medications on file prior to visit.    Allergies: Patient has no known allergies.  Review of Systems: General:   Denies fever, chills, unexplained weight loss.  Optho/Auditory:   Denies visual changes, blurred vision/LOV Respiratory:   Denies SOB, DOE more than baseline levels.   Cardiovascular:   Denies chest pain, palpitations, new onset peripheral edema  Gastrointestinal:   Denies nausea, vomiting, diarrhea.  Genitourinary: Denies dysuria, freq/ urgency, flank pain or discharge from genitals.  Endocrine:     Denies hot or cold intolerance, polyuria, polydipsia. Musculoskeletal:   Denies unexplained myalgias, joint swelling, unexplained arthralgias, gait problems.  Skin:  Denies rash, suspicious lesions Neurological:     Denies  dizziness, unexplained weakness, numbness  Psychiatric/Behavioral:   Denies mood changes, suicidal or homicidal ideations, hallucinations    Objective:    Blood pressure 103/67, pulse 61, temperature (!) 97.5 F (36.4 C), temperature source Oral, resp. rate 12, height 5' 6.75" (1.695 m), weight 211 lb 6.4 oz (95.9 kg), SpO2 100 %. Body mass index is 33.36 kg/m. General Appearance:    Alert, cooperative, no distress, appears stated age  Head:    Normocephalic, without obvious abnormality, atraumatic  Eyes:    PERRL, conjunctiva/corneas clear, EOM's intact, fundi    benign, both eyes  Ears:    Normal TM's and external ear canals, both ears  Nose:   Nares normal, septum midline, mucosa normal, no drainage    or sinus tenderness  Throat:   Lips w/o lesion, mucosa moist, and tongue normal; teeth and   gums normal  Neck:   Supple, symmetrical, trachea midline, no adenopathy;    thyroid:  no enlargement/tenderness/nodules; no carotid   bruit or JVD  Back:     Symmetric, no curvature, ROM normal, no CVA tenderness  Lungs:     Clear to auscultation bilaterally, respirations unlabored, no       Wh/ R/ R  Chest Wall:    No tenderness or gross deformity; normal excursion   Heart:    Regular rate and rhythm, S1 and S2 normal, no murmur, rub   or gallop  Breast Exam:    Recently seen by OBGYN.  Abdomen:     Soft, non-tender, bowel sounds active all four quadrants, NO   G/R/R, no masses, no organomegaly  Genitalia:    Recently seen by OBGYN.  Rectal:    Deferred.  Extremities:   Extremities normal, atraumatic, no cyanosis or gross edema  Pulses:   2+ and symmetric all extremities  Skin:   Warm, dry, Skin color, texture, turgor normal, no obvious rashes or lesions Psych: No HI/SI, judgement and insight good, Euthymic mood. Full Affect.  Neurologic:   CNII-XII intact, normal strength, sensation and reflexes    Throughout

## 2019-11-06 LAB — COMPREHENSIVE METABOLIC PANEL
ALT: 12 IU/L (ref 0–32)
AST: 15 IU/L (ref 0–40)
Albumin/Globulin Ratio: 1.9 (ref 1.2–2.2)
Albumin: 4.3 g/dL (ref 3.8–4.9)
Alkaline Phosphatase: 121 IU/L — ABNORMAL HIGH (ref 39–117)
BUN/Creatinine Ratio: 20 (ref 9–23)
BUN: 19 mg/dL (ref 6–24)
Bilirubin Total: 0.4 mg/dL (ref 0.0–1.2)
CO2: 22 mmol/L (ref 20–29)
Calcium: 9.4 mg/dL (ref 8.7–10.2)
Chloride: 108 mmol/L — ABNORMAL HIGH (ref 96–106)
Creatinine, Ser: 0.94 mg/dL (ref 0.57–1.00)
GFR calc Af Amer: 81 mL/min/{1.73_m2} (ref >59)
GFR calc non Af Amer: 70 mL/min/{1.73_m2} (ref >59)
Globulin, Total: 2.3 g/dL (ref 1.5–4.5)
Glucose: 91 mg/dL (ref 65–99)
Potassium: 4.5 mmol/L (ref 3.5–5.2)
Sodium: 142 mmol/L (ref 134–144)
Total Protein: 6.6 g/dL (ref 6.0–8.5)

## 2019-11-06 LAB — CBC
Hematocrit: 42.6 % (ref 34.0–46.6)
Hemoglobin: 13.5 g/dL (ref 11.1–15.9)
MCH: 26.2 pg — ABNORMAL LOW (ref 26.6–33.0)
MCHC: 31.7 g/dL (ref 31.5–35.7)
MCV: 83 fL (ref 79–97)
Platelets: 245 10*3/uL (ref 150–450)
RBC: 5.16 x10E6/uL (ref 3.77–5.28)
RDW: 14 % (ref 11.7–15.4)
WBC: 4.2 10*3/uL (ref 3.4–10.8)

## 2019-11-06 LAB — LIPID PANEL
Chol/HDL Ratio: 2.8 ratio (ref 0.0–4.4)
Cholesterol, Total: 143 mg/dL (ref 100–199)
HDL: 52 mg/dL (ref >39)
LDL Chol Calc (NIH): 80 mg/dL (ref 0–99)
Triglycerides: 49 mg/dL (ref 0–149)
VLDL Cholesterol Cal: 11 mg/dL (ref 5–40)

## 2019-11-06 LAB — HEMOGLOBIN A1C
Est. average glucose Bld gHb Est-mCnc: 100 mg/dL
Hgb A1c MFr Bld: 5.1 % (ref 4.8–5.6)

## 2019-11-06 LAB — T4, FREE: Free T4: 2.33 ng/dL — ABNORMAL HIGH (ref 0.82–1.77)

## 2019-11-06 LAB — TSH: TSH: 0.329 u[IU]/mL — ABNORMAL LOW (ref 0.450–4.500)

## 2019-11-06 LAB — VITAMIN D 25 HYDROXY (VIT D DEFICIENCY, FRACTURES): Vit D, 25-Hydroxy: 60.1 ng/mL (ref 30.0–100.0)

## 2019-11-07 ENCOUNTER — Encounter: Payer: Self-pay | Admitting: Adult Health

## 2019-11-10 ENCOUNTER — Telehealth: Payer: Self-pay | Admitting: Family Medicine

## 2019-11-10 DIAGNOSIS — E039 Hypothyroidism, unspecified: Secondary | ICD-10-CM

## 2019-11-10 MED ORDER — LEVOTHYROXINE SODIUM 112 MCG PO TABS: ORAL_TABLET | ORAL | 0 refills | Status: DC

## 2019-11-10 MED ORDER — LEVOTHYROXINE SODIUM 100 MCG PO TABS: ORAL_TABLET | ORAL | 0 refills | Status: DC

## 2019-11-10 NOTE — Telephone Encounter (Signed)
-----   Message from Thomasene Lot, DO sent at 11/10/2019  1:34 PM EDT ----- Please let patient know that overall labs look good and are within normal limits.   However, it does show that she is taking too much Synthroid.  If patient is agreeable, please change her existing prescription of the Synthroid 112 mcg to every day before breakfast except Mondays and Thursdays!!      Then, please send a separate prescription for Synthroid 100 mcg to be taken just on Mondays and Thursdays before breakfast.   Please only give her enough for her to have 3 months worth of meds and tell her it is extremely important she come in in 6-8 weeks for a TSH, free T4 and T3 level recheck.

## 2019-11-19 ENCOUNTER — Ambulatory Visit (INDEPENDENT_AMBULATORY_CARE_PROVIDER_SITE_OTHER): Payer: BC Managed Care – PPO | Admitting: Family Medicine

## 2019-11-19 ENCOUNTER — Encounter (INDEPENDENT_AMBULATORY_CARE_PROVIDER_SITE_OTHER): Payer: Self-pay | Admitting: Family Medicine

## 2019-11-19 ENCOUNTER — Other Ambulatory Visit: Payer: Self-pay

## 2019-11-19 VITALS — BP 109/75 | HR 58 | Temp 97.6°F | Ht 66.0 in | Wt 201.0 lb

## 2019-11-19 DIAGNOSIS — E559 Vitamin D deficiency, unspecified: Secondary | ICD-10-CM | POA: Diagnosis not present

## 2019-11-19 DIAGNOSIS — Z6832 Body mass index (BMI) 32.0-32.9, adult: Secondary | ICD-10-CM

## 2019-11-19 DIAGNOSIS — E669 Obesity, unspecified: Secondary | ICD-10-CM

## 2019-11-24 ENCOUNTER — Ambulatory Visit (AMBULATORY_SURGERY_CENTER): Payer: Self-pay | Admitting: *Deleted

## 2019-11-24 ENCOUNTER — Other Ambulatory Visit: Payer: Self-pay

## 2019-11-24 VITALS — Ht 66.75 in | Wt 201.0 lb

## 2019-11-24 DIAGNOSIS — Z1211 Encounter for screening for malignant neoplasm of colon: Secondary | ICD-10-CM

## 2019-11-24 NOTE — Progress Notes (Signed)
Pt verified name, DOB, address and insurance during PV today. Pt mailed instruction packet to included paper to complete and mail back to [facility] with addressed and stamped envelope, Emmi video, copy of consent form to read and not return, and instructions. PV completed over the phone. Pt encouraged to call with questions or issues   Completed covid vacicnes 10-17-2019  Pt Rs to 5-10 1030 am fron=m 4 pm   No egg or soy allergy known to patient  No issues with past sedation with any surgeries  or procedures, no intubation problems  No diet pills per patient No home 02 use per patient  No blood thinners per patient  Pt denies issues with constipation  No A fib or A flutter  EMMI video sent to pt's e mail   Due to the COVID-19 pandemic we are asking patients to follow these guidelines. Please only bring one care partner. Please be aware that your care partner may wait in the car in the parking lot or if they feel like they will be too hot to wait in the car, they may wait in the lobby on the 4th floor. All care partners are required to wear a mask the entire time (we do not have any that we can provide them), they need to practice social distancing, and we will do a Covid check for all patient's and care partners when you arrive. Also we will check their temperature and your temperature. If the care partner waits in their car they need to stay in the parking lot the entire time and we will call them on their cell phone when the patient is ready for discharge so they can bring the car to the front of the building. Also all patient's will need to wear a mask into building.

## 2019-11-25 NOTE — Progress Notes (Signed)
Chief Complaint:   OBESITY Karen Gross is here to discuss her progress with her obesity treatment plan along with follow-up of her obesity related diagnoses. Karen Gross is on the Category 2 Plan and states she is following her eating plan approximately 99% of the time. Karen Gross states she is walking for 1.5 miles 5 times per week.  Today's visit was #: 4 Starting weight: 217 lbs Starting date: 10/01/2019 Today's weight: 201 lbs Today's date: 11/19/2019 Total lbs lost to date: 16 Total lbs lost since last in-office visit: 5  Interim History: Karen Gross has done well with weight loss on her Category 2 plan. Her hunger is controlled but she is struggling to eat all of her protein, especially at lunch.  Subjective:   1. Vitamin D deficiency Karen Gross Vit D level is now at goal, and her levels increased relatively quickly. She is at high risk of over-replacement due to this.  Assessment/Plan:   1. Vitamin D deficiency Low Vitamin D level contributes to fatigue and are associated with obesity, breast, and colon cancer. Karen Gross agreed to change to prescription Vit D 50,000 IU every 14 days (no refill needed). She will follow-up for routine testing of Vitamin D, at least 2-3 times per year to avoid over-replacement.  2. Class 1 obesity with serious comorbidity and body mass index (BMI) of 32.0 to 32.9 in adult, unspecified obesity type Karen Gross is currently in the action stage of change. As such, her goal is to continue with weight loss efforts. She has agreed to the Category 2 Plan with lunch options.   Exercise goals: As is.  Behavioral modification strategies: increasing lean protein intake and better snacking choices.  Karen Gross has agreed to follow-up with our clinic in 2 weeks. She was informed of the importance of frequent follow-up visits to maximize her success with intensive lifestyle modifications for her multiple health conditions.   Objective:   Blood pressure 109/75, pulse (!) 58,  temperature 97.6 F (36.4 C), temperature source Oral, height 5\' 6"  (1.676 m), weight 201 lb (91.2 kg), SpO2 98 %. Body mass index is 32.44 kg/m.  General: Cooperative, alert, well developed, in no acute distress. HEENT: Conjunctivae and lids unremarkable. Cardiovascular: Regular rhythm.  Lungs: Normal work of breathing. Neurologic: No focal deficits.   Lab Results  Component Value Date   CREATININE 0.94 11/05/2019   BUN 19 11/05/2019   NA 142 11/05/2019   K 4.5 11/05/2019   CL 108 (H) 11/05/2019   CO2 22 11/05/2019   Lab Results  Component Value Date   ALT 12 11/05/2019   AST 15 11/05/2019   ALKPHOS 121 (H) 11/05/2019   BILITOT 0.4 11/05/2019   Lab Results  Component Value Date   HGBA1C 5.1 11/05/2019   HGBA1C 5.3 03/11/2019   HGBA1C 5.3 09/27/2017   HGBA1C 5.2 11/12/2014   Lab Results  Component Value Date   INSULIN 6.5 10/01/2019   Lab Results  Component Value Date   TSH 0.329 (L) 11/05/2019   Lab Results  Component Value Date   CHOL 143 11/05/2019   HDL 52 11/05/2019   LDLCALC 80 11/05/2019   TRIG 49 11/05/2019   CHOLHDL 2.8 11/05/2019   Lab Results  Component Value Date   WBC 4.2 11/05/2019   HGB 13.5 11/05/2019   HCT 42.6 11/05/2019   MCV 83 11/05/2019   PLT 245 11/05/2019   No results found for: IRON, TIBC, FERRITIN  Attestation Statements:   Reviewed by clinician on day of visit: allergies,  medications, problem list, medical history, surgical history, family history, social history, and previous encounter notes.  Time spent on visit including pre-visit chart review and post-visit care and charting was 30 minutes.    I, Trixie Dredge, am acting as transcriptionist for Dennard Nip, MD.  I have reviewed the above documentation for accuracy and completeness, and I agree with the above. -  Dennard Nip, MD

## 2019-12-04 ENCOUNTER — Encounter: Payer: Self-pay | Admitting: Gastroenterology

## 2019-12-08 ENCOUNTER — Ambulatory Visit (AMBULATORY_SURGERY_CENTER): Payer: BC Managed Care – PPO | Admitting: Gastroenterology

## 2019-12-08 ENCOUNTER — Encounter: Payer: Self-pay | Admitting: Gastroenterology

## 2019-12-08 ENCOUNTER — Other Ambulatory Visit: Payer: Self-pay

## 2019-12-08 ENCOUNTER — Encounter: Payer: BC Managed Care – PPO | Admitting: Gastroenterology

## 2019-12-08 VITALS — BP 105/68 | HR 55 | Temp 97.1°F | Resp 11 | Ht 66.0 in

## 2019-12-08 DIAGNOSIS — Z1211 Encounter for screening for malignant neoplasm of colon: Secondary | ICD-10-CM | POA: Diagnosis present

## 2019-12-08 MED ORDER — SODIUM CHLORIDE 0.9 % IV SOLN: 500.0000 mL | INTRAVENOUS | Status: DC

## 2019-12-08 NOTE — Patient Instructions (Addendum)
YOU HAD AN ENDOSCOPIC PROCEDURE TODAY AT [facility]:   Refer to the procedure report that was given to you for any specific questions about what was found during the examination.  If the procedure report does not answer your questions, please call your gastroenterologist to clarify.  If you requested that your care partner not be given the details of your procedure findings, then the procedure report has been included in a sealed envelope for you to review at your convenience later.  YOU SHOULD EXPECT: Some feelings of bloating in the abdomen. Passage of more gas than usual.  Walking can help get rid of the air that was put into your GI tract during the procedure and reduce the bloating. If you had a lower endoscopy (such as a colonoscopy or flexible sigmoidoscopy) you may notice spotting of blood in your stool or on the toilet paper. If you underwent a bowel prep for your procedure, you may not have a normal bowel movement for a few days.  Please Note:  You might notice some irritation and congestion in your nose or some drainage.  This is from the oxygen used during your procedure.  There is no need for concern and it should clear up in a day or so.  SYMPTOMS TO REPORT IMMEDIATELY:   Following lower endoscopy (colonoscopy or flexible sigmoidoscopy):  Excessive amounts of blood in the stool  Significant tenderness or worsening of abdominal pains  Swelling of the abdomen that is new, acute  Fever of 100F or higher    For urgent or emergent issues, a gastroenterologist can be reached at any hour by calling [phone]. Do not use MyChart messaging for urgent concerns.    DIET:  We do recommend a small meal at first, but then you may proceed to your regular diet.  Drink plenty of fluids but you should avoid alcoholic beverages for 24 hours.  ACTIVITY:  You should plan to take it easy for the rest of today and you should NOT DRIVE or use heavy machinery until tomorrow  (because of the sedation medicines used during the test).    FOLLOW UP: Our staff will call the number listed on your records 48-72 hours following your procedure to check on you and address any questions or concerns that you may have regarding the information given to you following your procedure. If we do not reach you, we will leave a message.  We will attempt to reach you two times.  During this call, we will ask if you have developed any symptoms of COVID 19. If you develop any symptoms (ie: fever, flu-like symptoms, shortness of breath, cough etc.) before then, please call [phone].  If you test positive for Covid 19 in the 2 weeks post procedure, please call and report this information to [place].    If any biopsies were taken you will be contacted by phone or by letter within the next 1-3 weeks.  Please call us at [phone] if you have not heard about the biopsies in 3 weeks.    SIGNATURES/CONFIDENTIALITY: You and/or your care partner have signed paperwork which will be entered into your electronic medical record.  These signatures attest to the fact that that the information above on your After Visit Summary has been reviewed and is understood.  Full responsibility of the confidentiality of this discharge information lies with you and/or your care-partner.    Handouts were given to you on diverticulosis and internal hemorhhoids. You may resume your  current medications today. Repeat colonoscopy in 10 years for screening purposes. Please call if any questions or concerns.

## 2019-12-08 NOTE — Progress Notes (Signed)
No problems noted in the recovery room. maw 

## 2019-12-08 NOTE — Op Note (Signed)
[place] Patient Name: Karen Gross Procedure Date: 12/08/2019 11:02 AM MRN: [id number] Endoscopist: Mauri Pole , MD Age: 54 Referring MD:  Date of Birth: [birth year] Gender: Female Account #: [id number] Procedure:                Colonoscopy Indications:              Screening for colorectal malignant neoplasm Medicines:                Monitored Anesthesia Care Procedure:                Pre-Anesthesia Assessment:                           - Prior to the procedure, a History and Physical                            was performed, and patient medications and                            allergies were reviewed. The patient's tolerance of                            previous anesthesia was also reviewed. The risks                            and benefits of the procedure and the sedation                            options and risks were discussed with the patient.                            All questions were answered, and informed consent                            was obtained. Prior Anticoagulants: The patient has                            taken no previous anticoagulant or antiplatelet                            agents. ASA Grade Assessment: II - A patient with                            mild systemic disease. After reviewing the risks                            and benefits, the patient was deemed in                            satisfactory condition to undergo the procedure.                           After obtaining informed consent, the colonoscope  was passed under direct vision. Throughout the                            procedure, the patient's blood pressure, pulse, and                            oxygen saturations were monitored continuously. The                            Colonoscope was introduced through the anus and                            advanced to the the cecum, identified by                            appendiceal orifice  and ileocecal valve. The                            colonoscopy was performed without difficulty. The                            patient tolerated the procedure well. The quality                            of the bowel preparation was excellent. The                            ileocecal valve, appendiceal orifice, and rectum                            were photographed. Scope In: 11:12:20 AM Scope Out: 11:27:47 AM Scope Withdrawal Time: 0 hours 10 minutes 51 seconds  Total Procedure Duration: 0 hours 15 minutes 27 seconds  Findings:                 The perianal and digital rectal examinations were                            normal.                           Scattered small-mouthed diverticula were found in                            the sigmoid colon, descending colon, transverse                            colon and ascending colon.                           Non-bleeding internal hemorrhoids were found during                            retroflexion. The hemorrhoids were small.  The exam was otherwise without abnormality. Complications:            No immediate complications. Estimated Blood Loss:     Estimated blood loss: none. Impression:               - Diverticulosis in the sigmoid colon, in the                            descending colon, in the transverse colon and in                            the ascending colon.                           - Non-bleeding internal hemorrhoids.                           - The examination was otherwise normal.                           - No specimens collected. Recommendation:           - Patient has a contact number available for                            emergencies. The signs and symptoms of potential                            delayed complications were discussed with the                            patient. Return to normal activities tomorrow.                            Written discharge instructions were provided to the                             patient.                           - Resume previous diet.                           - Continue present medications.                           - Repeat colonoscopy in 10 years for screening                            purposes. Napoleon Form, MD 12/08/2019 11:32:12 AM This report has been signed electronically.

## 2019-12-08 NOTE — Progress Notes (Signed)
Report given to PACU, vss 

## 2019-12-10 ENCOUNTER — Encounter (INDEPENDENT_AMBULATORY_CARE_PROVIDER_SITE_OTHER): Payer: Self-pay | Admitting: Family Medicine

## 2019-12-10 ENCOUNTER — Ambulatory Visit (INDEPENDENT_AMBULATORY_CARE_PROVIDER_SITE_OTHER): Payer: BC Managed Care – PPO | Admitting: Family Medicine

## 2019-12-10 ENCOUNTER — Telehealth: Payer: Self-pay

## 2019-12-10 ENCOUNTER — Other Ambulatory Visit: Payer: Self-pay

## 2019-12-10 VITALS — BP 96/65 | HR 64 | Temp 97.7°F | Ht 66.0 in | Wt 196.0 lb

## 2019-12-10 DIAGNOSIS — Z6831 Body mass index (BMI) 31.0-31.9, adult: Secondary | ICD-10-CM

## 2019-12-10 DIAGNOSIS — E559 Vitamin D deficiency, unspecified: Secondary | ICD-10-CM | POA: Diagnosis not present

## 2019-12-10 DIAGNOSIS — Z9189 Other specified personal risk factors, not elsewhere classified: Secondary | ICD-10-CM | POA: Diagnosis not present

## 2019-12-10 DIAGNOSIS — E669 Obesity, unspecified: Secondary | ICD-10-CM

## 2019-12-10 DIAGNOSIS — E039 Hypothyroidism, unspecified: Secondary | ICD-10-CM | POA: Diagnosis not present

## 2019-12-10 MED ORDER — VITAMIN D (ERGOCALCIFEROL) 1.25 MG (50000 UNIT) PO CAPS: 50000.0000 [IU] | ORAL_CAPSULE | ORAL | 0 refills | Status: DC

## 2019-12-10 NOTE — Telephone Encounter (Signed)
  Follow up Call-  Call back number 12/08/2019  Post procedure Call Back phone  # [phone]  Permission to leave phone message Yes  Some recent data might be hidden     Patient questions:  Do you have a fever, pain , or abdominal swelling? No. Pain Score  0 *  Have you tolerated food without any problems? Yes.    Have you been able to return to your normal activities? Yes.    Do you have any questions about your discharge instructions: Diet   No. Medications  No. Follow up visit  No.  Do you have questions or concerns about your Care? No.  Actions: * If pain score is 4 or above: No action needed, pain <4.  1. Have you developed a fever since your procedure? No  2.   Have you had an respiratory symptoms (SOB or cough) since your procedure? No 3.   Have you tested positive for COVID 19 since your procedure No  4.   Have you had any family members/close contacts diagnosed with the COVID 19 since your procedure?  No  If yes to any of these questions please route to Laverna Peace, RN and Charlett Lango, RN

## 2019-12-10 NOTE — Progress Notes (Signed)
Chief Complaint:   OBESITY Karen Gross is here to discuss her progress with her obesity treatment plan along with follow-up of her obesity related diagnoses. Karen Gross is on the Category 2 Plan with lunch options and states she is following her eating plan approximately 99% of the time. Karen Gross states she is walking for 20 minutes 5 times per week.  Today's visit was #: 5 Starting weight: 217 lbs Starting date: 10/01/2019 Today's weight: 196 lbs Today's date: 12/10/2019 Total lbs lost to date: 21 Total lbs lost since last in-office visit: 5  Interim History: Karen Gross is struggling to get protein in at lunch but she is doing better. She sometimes has a salad without protein. She denies excessive hunger. She is sticking to the plan very well other than occasional lack of protein at lunch.  Subjective:   1. Vitamin D deficiency Karen Gross's last Vit D level was at goal. She is on weekly prescription Vit D.  2. Hypothyroidism, unspecified type Karen Gross is on 112 mcg levothyroxine every day except Mondays and Thursdays and 100 mcg Mondays and Thursdays.  Hypothyroidism is managed by her primary care physician. She is scheduled for TSH recheck as her TSH recently was low and her dose of levothyroxine was decreased.  3. At risk for deficient intake of food The patient is at a higher than average risk of deficient intake of food due to inadequate protein intake.  Assessment/Plan:   1. Vitamin D deficiency Low Vitamin D level contributes to fatigue and are associated with obesity, breast, and colon cancer. We will refill prescription Vitamin D for 1 month. Karen Gross will follow-up for routine testing of Vitamin D, at least 2-3 times per year to avoid over-replacement.  - Vitamin D, Ergocalciferol, (DRISDOL) 1.25 MG (50000 UNIT) CAPS capsule; Take 1 capsule (50,000 Units total) by mouth every 7 (seven) days.  Dispense: 4 capsule; Refill: 0  2. Hypothyroidism, unspecified type Patient with  long-standing hypothyroidism, on levothyroxine therapy. She appears euthyroid. Karen Gross will continue to follow up with her primary care physician.   3. At risk for deficient intake of food Karen Gross was given approximately 15 minutes of deficit intake of food prevention counseling today. Karen Gross is at risk for eating too few calories based on current food recall. She was encouraged to focus on meeting caloric and protein goals according to her recommended meal plan.   4. Class 1 obesity with serious comorbidity and body mass index (BMI) of 31.0 to 31.9 in adult, unspecified obesity type Karen Gross is currently in the action stage of change. As such, her goal is to continue with weight loss efforts. She has agreed to the Category 2 Plan.   Handout given today: Protein Equivalents.  Exercise goals: Karen Gross is to increase her cardio to 30 minutes 5 times per week.  Behavioral modification strategies: increasing lean protein intake and meal planning and cooking strategies.  Karen Gross has agreed to follow-up with our clinic in 2 weeks. She was informed of the importance of frequent follow-up visits to maximize her success with intensive lifestyle modifications for her multiple health conditions.   Objective:   Blood pressure 96/65, pulse 64, temperature 97.7 F (36.5 C), temperature source Oral, height 5\' 6"  (1.676 m), weight 196 lb (88.9 kg), SpO2 98 %. Body mass index is 31.64 kg/m.  General: Cooperative, alert, well developed, in no acute distress. HEENT: Conjunctivae and lids unremarkable. Cardiovascular: Regular rhythm.  Lungs: Normal work of breathing. Neurologic: No focal deficits.   Lab Results  Component  Value Date   CREATININE 0.94 11/05/2019   BUN 19 11/05/2019   NA 142 11/05/2019   K 4.5 11/05/2019   CL 108 (H) 11/05/2019   CO2 22 11/05/2019   Lab Results  Component Value Date   ALT 12 11/05/2019   AST 15 11/05/2019   ALKPHOS 121 (H) 11/05/2019   BILITOT 0.4 11/05/2019    Lab Results  Component Value Date   HGBA1C 5.1 11/05/2019   HGBA1C 5.3 03/11/2019   HGBA1C 5.3 09/27/2017   HGBA1C 5.2 11/12/2014   Lab Results  Component Value Date   INSULIN 6.5 10/01/2019   Lab Results  Component Value Date   TSH 0.329 (L) 11/05/2019   Lab Results  Component Value Date   CHOL 143 11/05/2019   HDL 52 11/05/2019   LDLCALC 80 11/05/2019   TRIG 49 11/05/2019   CHOLHDL 2.8 11/05/2019   Lab Results  Component Value Date   WBC 4.2 11/05/2019   HGB 13.5 11/05/2019   HCT 42.6 11/05/2019   MCV 83 11/05/2019   PLT 245 11/05/2019   No results found for: IRON, TIBC, FERRITIN  Attestation Statements:   Reviewed by clinician on day of visit: allergies, medications, problem list, medical history, surgical history, family history, social history, and previous encounter notes.   Trude Mcburney, am acting as Energy manager for [place], FNP-C.  I have reviewed the above documentation for accuracy and completeness, and I agree with the above. -  Jesse Sans, FNP

## 2019-12-25 ENCOUNTER — Other Ambulatory Visit: Payer: BC Managed Care – PPO

## 2019-12-25 ENCOUNTER — Other Ambulatory Visit: Payer: Self-pay

## 2019-12-25 DIAGNOSIS — E039 Hypothyroidism, unspecified: Secondary | ICD-10-CM

## 2019-12-25 NOTE — Addendum Note (Signed)
Addended by: Stan Head on: 12/25/2019 08:18 AM   Modules accepted: Orders

## 2019-12-26 LAB — T4, FREE: Free T4: 1.89 ng/dL — ABNORMAL HIGH (ref 0.82–1.77)

## 2019-12-26 LAB — TSH: TSH: 0.817 u[IU]/mL (ref 0.450–4.500)

## 2019-12-26 LAB — T3, FREE: T3, Free: 2.3 pg/mL (ref 2.0–4.4)

## 2020-01-01 ENCOUNTER — Other Ambulatory Visit: Payer: Self-pay

## 2020-01-01 ENCOUNTER — Encounter (INDEPENDENT_AMBULATORY_CARE_PROVIDER_SITE_OTHER): Payer: Self-pay | Admitting: Family Medicine

## 2020-01-01 ENCOUNTER — Ambulatory Visit (INDEPENDENT_AMBULATORY_CARE_PROVIDER_SITE_OTHER): Payer: BC Managed Care – PPO | Admitting: Family Medicine

## 2020-01-01 VITALS — BP 92/59 | HR 63 | Temp 98.0°F | Ht 66.0 in | Wt 190.0 lb

## 2020-01-01 DIAGNOSIS — E559 Vitamin D deficiency, unspecified: Secondary | ICD-10-CM

## 2020-01-01 DIAGNOSIS — E669 Obesity, unspecified: Secondary | ICD-10-CM

## 2020-01-01 DIAGNOSIS — Z6830 Body mass index (BMI) 30.0-30.9, adult: Secondary | ICD-10-CM | POA: Diagnosis not present

## 2020-01-01 NOTE — Progress Notes (Signed)
Chief Complaint:   OBESITY Karen Gross is here to discuss her progress with her obesity treatment plan along with follow-up of her obesity related diagnoses. Karen Gross is on the Category 2 Plan and states she is following her eating plan approximately 95% of the time. Karen Gross states she is doing 0 minutes 0 times per week.  Today's visit was #: 6 Starting weight: 217 lbs Starting date: 10/01/2019 Today's weight: 190 lbs Today's date: 01/01/2020 Total lbs lost to date: 27 Total lbs lost since last in-office visit: 6  Interim History: Karen Gross is still quite happy with the plan and she is doing very well. She has lost 27 lbs in 3 months.  She is eating all of the protein on the plan now. She had been having a salad without protein for lunch. Her hunger is well controlled.  Subjective:   1. Vitamin D deficiency Karen Gross's Vit D level is at goal. She is on weekly prescription Vit D.  Assessment/Plan:   1. Vitamin D deficiency Low Vitamin D level contributes to fatigue and are associated with obesity, breast, and colon cancer. Karen Gross agreed to continue taking prescription Vitamin D 50,000 IU every week and will follow-up for routine testing of Vitamin D, at least 2-3 times per year to avoid over-replacement. We will recheck labs in July 2021.  2. Class 1 obesity with serious comorbidity and body mass index (BMI) of 30.0 to 30.9 in adult, unspecified obesity type Karen Gross is currently in the action stage of change. As such, her goal is to continue with weight loss efforts. She has agreed to the Category 2 Plan and keeping a food journal and adhering to recommended goals of 400-500 calories and 35 grams of protein at supper daily.   Handouts given today: Journaling, and Recipes.  Exercise goals: All adults should avoid inactivity. Some physical activity is better than none, and adults who participate in any amount of physical activity gain some health benefits.  Behavioral modification  strategies: meal planning and cooking strategies and planning for success.  Karen Gross has agreed to follow-up with our clinic in 2 weeks with William Hamburger, NP-C. She was informed of the importance of frequent follow-up visits to maximize her success with intensive lifestyle modifications for her multiple health conditions.   Objective:   Blood pressure (!) 92/59, pulse 63, temperature 98 F (36.7 C), temperature source Oral, height 5\' 6"  (1.676 m), weight 190 lb (86.2 kg), SpO2 99 %. Body mass index is 30.67 kg/m.  General: Cooperative, alert, well developed, in no acute distress. HEENT: Conjunctivae and lids unremarkable. Cardiovascular: Regular rhythm.  Lungs: Normal work of breathing. Neurologic: No focal deficits.   Lab Results  Component Value Date   CREATININE 0.94 11/05/2019   BUN 19 11/05/2019   NA 142 11/05/2019   K 4.5 11/05/2019   CL 108 (H) 11/05/2019   CO2 22 11/05/2019   Lab Results  Component Value Date   ALT 12 11/05/2019   AST 15 11/05/2019   ALKPHOS 121 (H) 11/05/2019   BILITOT 0.4 11/05/2019   Lab Results  Component Value Date   HGBA1C 5.1 11/05/2019   HGBA1C 5.3 03/11/2019   HGBA1C 5.3 09/27/2017   HGBA1C 5.2 11/12/2014   Lab Results  Component Value Date   INSULIN 6.5 10/01/2019   Lab Results  Component Value Date   TSH 0.817 12/25/2019   Lab Results  Component Value Date   CHOL 143 11/05/2019   HDL 52 11/05/2019   LDLCALC 80 11/05/2019  TRIG 49 11/05/2019   CHOLHDL 2.8 11/05/2019   Lab Results  Component Value Date   WBC 4.2 11/05/2019   HGB 13.5 11/05/2019   HCT 42.6 11/05/2019   MCV 83 11/05/2019   PLT 245 11/05/2019   No results found for: IRON, TIBC, FERRITIN  Attestation Statements:   Reviewed by clinician on day of visit: allergies, medications, problem list, medical history, surgical history, family history, social history, and previous encounter notes.   Wilhemena Durie, am acting as Location manager for [employer], FNP-C.  I have reviewed the above documentation for accuracy and completeness, and I agree with the above. -  Georgianne Fick, FNP

## 2020-01-19 ENCOUNTER — Other Ambulatory Visit: Payer: Self-pay

## 2020-01-19 ENCOUNTER — Ambulatory Visit (INDEPENDENT_AMBULATORY_CARE_PROVIDER_SITE_OTHER): Payer: BC Managed Care – PPO | Admitting: Adult Health

## 2020-01-19 ENCOUNTER — Encounter (INDEPENDENT_AMBULATORY_CARE_PROVIDER_SITE_OTHER): Payer: Self-pay | Admitting: Adult Health

## 2020-01-19 VITALS — BP 94/64 | HR 68 | Temp 97.9°F | Ht 66.0 in | Wt 187.0 lb

## 2020-01-19 DIAGNOSIS — E559 Vitamin D deficiency, unspecified: Secondary | ICD-10-CM | POA: Diagnosis not present

## 2020-01-19 DIAGNOSIS — E038 Other specified hypothyroidism: Secondary | ICD-10-CM

## 2020-01-19 DIAGNOSIS — Z9189 Other specified personal risk factors, not elsewhere classified: Secondary | ICD-10-CM | POA: Diagnosis not present

## 2020-01-19 DIAGNOSIS — G40909 Epilepsy, unspecified, not intractable, without status epilepticus: Secondary | ICD-10-CM

## 2020-01-19 DIAGNOSIS — Z6830 Body mass index (BMI) 30.0-30.9, adult: Secondary | ICD-10-CM

## 2020-01-19 DIAGNOSIS — E669 Obesity, unspecified: Secondary | ICD-10-CM

## 2020-01-19 MED ORDER — VITAMIN D (ERGOCALCIFEROL) 1.25 MG (50000 UNIT) PO CAPS: 50000.0000 [IU] | ORAL_CAPSULE | ORAL | 0 refills | Status: DC

## 2020-01-20 NOTE — Progress Notes (Signed)
Chief Complaint:   OBESITY Karen Gross is here to discuss her progress with her obesity treatment plan along with follow-up of her obesity related diagnoses. Karen Gross is on the Category 2 Plan and states she is following her eating plan approximately 90% of the time. Karen Gross states she is exercising 0 minutes 0 times per week.  Today's visit was #: 7 Starting weight: 217 lbs Starting date: 10/01/2019 Today's weight: 187 lbs Today's date: 01/19/2020 Total lbs lost to date: 30 Total lbs lost since last in-office visit: 3  Interim History: Karen Gross continues to enjoy the Category 2 meal plan and denies polyphagia. She has been celebrating her son's graduation and upcoming move to [place]. She experienced a seizure a few weeks ago and lost consciousness. Her mental health provider started her on Depakote 500 mg QHS and she is tolerating this well.  Subjective:   Vitamin D deficiency. Leza is on prescription Vitamin D supplementation. Last Vitamin D was 60.1 on 11/05/2019.  Other specified hypothyroidism. Recent thyroid panel was checked by her PCP. She denies increased fatigue. She is on alternating dosage of levothyroxine 100 mcg/112 mcg.   Lab Results  Component Value Date   TSH 0.817 12/25/2019   Seizure disorder (HCC). Karen Gross experienced a recent seizure a few weeks ago when outside during a very hot day. She lost consciousness and control of her bowels. Her mental healthcare provider started her on Depakote 500 mg QHS, which she is tolerating well.  At risk for complication associated with hypotension. The patient is at a higher than average risk of hypotension due to continued weight loss. She denies symptoms of hypotension.  Assessment/Plan:   Vitamin D deficiency. Low Vitamin D level contributes to fatigue and are associated with obesity, breast, and colon cancer. She was given a refill on her Vitamin D, Ergocalciferol, (DRISDOL) 1.25 MG (50000 UNIT) CAPS capsule every  week #4 with 0 refills and will follow-up for routine testing of Vitamin D, at least 2-3 times per year to avoid over-replacement.   Other specified hypothyroidism. Patient with long-standing hypothyroidism, on levothyroxine therapy. She appears euthyroid. Orders and follow up as documented in patient record. Karen Gross will continue her current levothyroxine dosage as directed.  Counseling . Good thyroid control is important for overall health. Supratherapeutic thyroid levels are dangerous and will not improve weight loss results. . The correct way to take levothyroxine is fasting, with water, separated by at least 30 minutes from breakfast, and separated by more than 4 hours from calcium, iron, multivitamins, acid reflux medications (PPIs).   Seizure disorder (HCC). We recommended that she be evaluated by Neurology. She declines referral today.  At risk for complication associated with hypotension. Karen Gross was given approximately 15 minutes of education and counseling today to help avoid hypotension. We discussed risks of hypotension with weight loss and signs of hypotension such as feeling lightheaded or unsteady.  Repetitive spaced learning was employed today to elicit superior memory formation and behavioral change.  Class 1 obesity with serious comorbidity and body mass index (BMI) of 30.0 to 30.9 in adult, unspecified obesity type.  Karen Gross is currently in the action stage of change. As such, her goal is to continue with weight loss efforts. She has agreed to the Category 2 Plan.   Handout was given on Protein Equivalents.  Exercise goals: No exercise has been prescribed at this time.  Behavioral modification strategies: increasing lean protein intake, travel eating strategies and celebration eating strategies.  Karen Gross has agreed  to follow-up with our clinic in 2 weeks. She was informed of the importance of frequent follow-up visits to maximize her success with intensive lifestyle  modifications for her multiple health conditions.   Objective:   Blood pressure 94/64, pulse 68, temperature 97.9 F (36.6 C), temperature source Oral, height 5\' 6"  (1.676 m), weight 187 lb (84.8 kg), SpO2 99 %. Body mass index is 30.18 kg/m.  General: Cooperative, alert, well developed, in no acute distress. HEENT: Conjunctivae and lids unremarkable. Cardiovascular: Regular rhythm.  Lungs: Normal work of breathing. Neurologic: No focal deficits.   Lab Results  Component Value Date   CREATININE 0.94 11/05/2019   BUN 19 11/05/2019   NA 142 11/05/2019   K 4.5 11/05/2019   CL 108 (H) 11/05/2019   CO2 22 11/05/2019   Lab Results  Component Value Date   ALT 12 11/05/2019   AST 15 11/05/2019   ALKPHOS 121 (H) 11/05/2019   BILITOT 0.4 11/05/2019   Lab Results  Component Value Date   HGBA1C 5.1 11/05/2019   HGBA1C 5.3 03/11/2019   HGBA1C 5.3 09/27/2017   HGBA1C 5.2 11/12/2014   Lab Results  Component Value Date   INSULIN 6.5 10/01/2019   Lab Results  Component Value Date   TSH 0.817 12/25/2019   Lab Results  Component Value Date   CHOL 143 11/05/2019   HDL 52 11/05/2019   LDLCALC 80 11/05/2019   TRIG 49 11/05/2019   CHOLHDL 2.8 11/05/2019   Lab Results  Component Value Date   WBC 4.2 11/05/2019   HGB 13.5 11/05/2019   HCT 42.6 11/05/2019   MCV 83 11/05/2019   PLT 245 11/05/2019   No results found for: IRON, TIBC, FERRITIN  Attestation Statements:   Reviewed by clinician on day of visit: allergies, medications, problem list, medical history, surgical history, family history, social history, and previous encounter notes.  I, Michaelene Song, am acting as Location manager for [employer], NP-C   I have reviewed the above documentation for accuracy and completeness, and I agree with the above. -  Esaw Grandchild, NP

## 2020-02-03 ENCOUNTER — Other Ambulatory Visit: Payer: Self-pay | Admitting: Family Medicine

## 2020-02-04 ENCOUNTER — Ambulatory Visit (INDEPENDENT_AMBULATORY_CARE_PROVIDER_SITE_OTHER): Payer: BC Managed Care – PPO | Admitting: Adult Health

## 2020-02-04 ENCOUNTER — Other Ambulatory Visit: Payer: Self-pay

## 2020-02-04 ENCOUNTER — Encounter (INDEPENDENT_AMBULATORY_CARE_PROVIDER_SITE_OTHER): Payer: Self-pay | Admitting: Adult Health

## 2020-02-04 VITALS — BP 105/71 | HR 60 | Temp 97.9°F | Ht 66.0 in | Wt 187.0 lb

## 2020-02-04 DIAGNOSIS — E038 Other specified hypothyroidism: Secondary | ICD-10-CM

## 2020-02-04 DIAGNOSIS — E669 Obesity, unspecified: Secondary | ICD-10-CM

## 2020-02-04 DIAGNOSIS — Z6830 Body mass index (BMI) 30.0-30.9, adult: Secondary | ICD-10-CM

## 2020-02-04 DIAGNOSIS — G40909 Epilepsy, unspecified, not intractable, without status epilepticus: Secondary | ICD-10-CM | POA: Diagnosis not present

## 2020-02-04 DIAGNOSIS — E559 Vitamin D deficiency, unspecified: Secondary | ICD-10-CM

## 2020-02-04 DIAGNOSIS — Z9189 Other specified personal risk factors, not elsewhere classified: Secondary | ICD-10-CM

## 2020-02-04 NOTE — Progress Notes (Signed)
Chief Complaint:   OBESITY Karen Gross is here to discuss her progress with her obesity treatment plan along with follow-up of her obesity related diagnoses. Karen Gross is on the Category 2 Plan and states she is following her eating plan approximately 95% of the time. Karen Gross states she is exercising 0 minutes 0 times per week.  Today's visit was #: 8 Starting weight: 217 lbs Starting date: 10/01/2019 Today's weight: 187 lbs Today's date: 02/04/2020 Total lbs lost to date: 30 Total lbs lost since last in-office visit: 0  Interim History: Karen Gross continues to enjoy the Category 2 meal plan and denies excessive cravings or polyphagia. She celebrated Independence Day with friends and family and focused on eating healthy with the addition of several beers spread out over an entire day. Her son has settled into life at [employer]. She will be returning to work next week.  Subjective:   Other specified hypothyroidism. Thyroid panel on 12/25/2019 was stable and her PCP wanted a recheck in 6-8 weeks- will draw today. No changes were made to her levothyroxine dosage in May. She denies excessive fatigue.  Lab Results  Component Value Date   TSH 0.817 12/25/2019   Vitamin D deficiency. Karen Gross is on prescription strength Vitamin D supplementation and is tolerating it well. No nausea, vomiting, or muscle weakness.    Ref. Range 11/05/2019 09:26  Vitamin D, 25-Hydroxy Latest Ref Range: 30.0 - 100.0 ng/mL 60.1   Seizure disorder (HCC). Karen Gross denies recent seizure. She is on Depakote 500 mg daily and tolerating it well. She has not established with a neurologist as recommended.  At risk for complication associated with hypotension. The patient is at a higher than average risk of hypotension due to steady weight loss. [name] will remain well hydrated.  Assessment/Plan:   Other specified hypothyroidism. Patient with long-standing hypothyroidism, on levothyroxine therapy. She appears euthyroid.  Orders and follow up as documented in patient record. TSH, T3, T4 levels will be checked today.  Counseling . Good thyroid control is important for overall health. Supratherapeutic thyroid levels are dangerous and will not improve weight loss results. . The correct way to take levothyroxine is fasting, with water, separated by at least 30 minutes from breakfast, and separated by more than 4 hours from calcium, iron, multivitamins, acid reflux medications (PPIs).     Vitamin D deficiency. Low Vitamin D level contributes to fatigue and are associated with obesity, breast, and colon cancer. She agrees to continue to take prescription Vitamin D as directed (no medication refill today) and will have routine testing of Vitamin D today.  Seizure disorder (HCC). We recommended follow-up with Neurology. She declined referral today. She will continue Depakote as directed.  At risk for complication associated with hypotension. [name] was given approximately 15 minutes of education and counseling today to help avoid hypotension. We discussed risks of hypotension with weight loss and signs of hypotension such as feeling lightheaded or unsteady.  Repetitive spaced learning was employed today to elicit superior memory formation and behavioral change.  Class 1 obesity with serious comorbidity and body mass index (BMI) of 30.0 to 30.9 in adult, unspecified obesity type.   Karen Gross is currently in the action stage of change. As such, her goal is to continue with weight loss efforts. She has agreed to the Category 2 Plan.   Exercise goals: No exercise has been prescribed at this time.  Behavioral modification strategies: increasing lean protein intake, meal planning and cooking strategies, holiday eating strategies  and celebration eating strategies.  Karen Gross has agreed to follow-up with our clinic in 2 weeks. She was informed of the importance of frequent follow-up visits to maximize her success with intensive  lifestyle modifications for her multiple health conditions.   Karen Gross was informed we would discuss her lab results at her next visit unless there is a critical issue that needs to be addressed sooner. Karen Gross agreed to keep her next visit at the agreed upon time to discuss these results.  Objective:   Blood pressure 105/71, pulse 60, temperature 97.9 F (36.6 C), temperature source Oral, height 5\' 6"  (1.676 m), weight 187 lb (84.8 kg), SpO2 100 %. Body mass index is 30.18 kg/m.  General: Cooperative, alert, well developed, in no acute distress. HEENT: Conjunctivae and lids unremarkable. Cardiovascular: Regular rhythm.  Lungs: Normal work of breathing. Neurologic: No focal deficits.   Lab Results  Component Value Date   CREATININE 0.94 11/05/2019   BUN 19 11/05/2019   NA 142 11/05/2019   K 4.5 11/05/2019   CL 108 (H) 11/05/2019   CO2 22 11/05/2019   Lab Results  Component Value Date   ALT 12 11/05/2019   AST 15 11/05/2019   ALKPHOS 121 (H) 11/05/2019   BILITOT 0.4 11/05/2019   Lab Results  Component Value Date   HGBA1C 5.1 11/05/2019   HGBA1C 5.3 03/11/2019   HGBA1C 5.3 09/27/2017   HGBA1C 5.2 11/12/2014   Lab Results  Component Value Date   INSULIN 6.5 10/01/2019   Lab Results  Component Value Date   TSH 0.817 12/25/2019   Lab Results  Component Value Date   CHOL 143 11/05/2019   HDL 52 11/05/2019   LDLCALC 80 11/05/2019   TRIG 49 11/05/2019   CHOLHDL 2.8 11/05/2019   Lab Results  Component Value Date   WBC 4.2 11/05/2019   HGB 13.5 11/05/2019   HCT 42.6 11/05/2019   MCV 83 11/05/2019   PLT 245 11/05/2019   No results found for: IRON, TIBC, FERRITIN  Attestation Statements:   Reviewed by clinician on day of visit: allergies, medications, problem list, medical history, surgical history, family history, social history, and previous encounter notes.  I, 01/05/2020, am acting as [employer] manager, NP-C   I have reviewed the above  documentation for accuracy and completeness, and I agree with the above. -  [employer], NP

## 2020-02-05 LAB — COMPREHENSIVE METABOLIC PANEL
ALT: 22 IU/L (ref 0–32)
AST: 17 IU/L (ref 0–40)
Albumin/Globulin Ratio: 1.7 (ref 1.2–2.2)
Albumin: 3.9 g/dL (ref 3.8–4.9)
Alkaline Phosphatase: 116 IU/L (ref 48–121)
BUN/Creatinine Ratio: 23 (ref 9–23)
BUN: 21 mg/dL (ref 6–24)
Bilirubin Total: 0.2 mg/dL (ref 0.0–1.2)
CO2: 23 mmol/L (ref 20–29)
Calcium: 9.3 mg/dL (ref 8.7–10.2)
Chloride: 108 mmol/L — ABNORMAL HIGH (ref 96–106)
Creatinine, Ser: 0.92 mg/dL (ref 0.57–1.00)
GFR calc Af Amer: 83 mL/min/{1.73_m2} (ref >59)
GFR calc non Af Amer: 72 mL/min/{1.73_m2} (ref >59)
Globulin, Total: 2.3 g/dL (ref 1.5–4.5)
Glucose: 85 mg/dL (ref 65–99)
Potassium: 4.1 mmol/L (ref 3.5–5.2)
Sodium: 144 mmol/L (ref 134–144)
Total Protein: 6.2 g/dL (ref 6.0–8.5)

## 2020-02-05 LAB — VITAMIN D 25 HYDROXY (VIT D DEFICIENCY, FRACTURES): Vit D, 25-Hydroxy: 41.1 ng/mL (ref 30.0–100.0)

## 2020-02-05 LAB — T3: T3, Total: 86 ng/dL (ref 71–180)

## 2020-02-05 LAB — T4: T4, Total: 11.3 ug/dL (ref 4.5–12.0)

## 2020-02-05 LAB — TSH: TSH: 1.08 u[IU]/mL (ref 0.450–4.500)

## 2020-02-05 LAB — VITAMIN B12: Vitamin B-12: 370 pg/mL (ref 232–1245)

## 2020-02-13 ENCOUNTER — Telehealth: Payer: Self-pay | Admitting: Physician Assistant

## 2020-02-13 MED ORDER — TOPIRAMATE 100 MG PO TABS: 100.0000 mg | ORAL_TABLET | Freq: Two times a day (BID) | ORAL | 0 refills | Status: DC

## 2020-02-13 NOTE — Addendum Note (Signed)
Addended by: Stan Head on: 02/13/2020 12:00 PM   Modules accepted: Orders

## 2020-02-13 NOTE — Telephone Encounter (Signed)
Patient called in stating medication has ran out and needs a refill. Topiramate. [place] [facility].

## 2020-02-25 ENCOUNTER — Encounter (INDEPENDENT_AMBULATORY_CARE_PROVIDER_SITE_OTHER): Payer: Self-pay | Admitting: Family Medicine

## 2020-02-25 ENCOUNTER — Other Ambulatory Visit: Payer: Self-pay

## 2020-02-25 ENCOUNTER — Ambulatory Visit (INDEPENDENT_AMBULATORY_CARE_PROVIDER_SITE_OTHER): Payer: BC Managed Care – PPO | Admitting: Family Medicine

## 2020-02-25 VITALS — BP 95/62 | HR 67 | Temp 98.1°F | Ht 66.0 in | Wt 187.0 lb

## 2020-02-25 DIAGNOSIS — Z6830 Body mass index (BMI) 30.0-30.9, adult: Secondary | ICD-10-CM | POA: Diagnosis not present

## 2020-02-25 DIAGNOSIS — E669 Obesity, unspecified: Secondary | ICD-10-CM

## 2020-02-25 DIAGNOSIS — E559 Vitamin D deficiency, unspecified: Secondary | ICD-10-CM | POA: Diagnosis not present

## 2020-02-25 DIAGNOSIS — Z9189 Other specified personal risk factors, not elsewhere classified: Secondary | ICD-10-CM | POA: Diagnosis not present

## 2020-02-25 MED ORDER — VITAMIN D (ERGOCALCIFEROL) 1.25 MG (50000 UNIT) PO CAPS: 50000.0000 [IU] | ORAL_CAPSULE | ORAL | 0 refills | Status: DC

## 2020-02-26 NOTE — Progress Notes (Signed)
Chief Complaint:   OBESITY Karen Gross is here to discuss her progress with her obesity treatment plan along with follow-up of her obesity related diagnoses. Karen Gross is on the Category 2 Plan and states she is following her eating plan approximately 85% of the time. Karen Gross states she is doing 0 minutes 0 times per week.  Today's visit was #: 9 Starting weight: 217 lbs Starting date: 10/01/2019 Today's weight: 185 lbs Today's date: 02/25/2020 Total lbs lost to date: 32 Total lbs lost since last in-office visit: 2  Interim History: Karen Gross is sticking to the plan well. In the morning she usually eats eggs and toast, and lunch and dinner consists of salad and grilled chicken. She does not get many cravings. She is going on vacation to the beach in a few weeks for 4 days with her sisters. She does not expect sabotage. She will buy and find healthy options when eating out.  Subjective:   1. Vitamin D deficiency Karen Gross is asymptomatic. Her Vit D level has somewhat decreased, and is no longer at goal.  2. At risk for osteoporosis Karen Gross is at higher risk of osteopenia and osteoporosis due to Vitamin D deficiency.   Assessment/Plan:   1. Vitamin D deficiency Low Vitamin D level contributes to fatigue and are associated with obesity, breast, and colon cancer. We will refill prescription Vitamin D for 1 month. Karen Gross will follow-up for routine testing of Vitamin D, at least 2-3 times per year to avoid over-replacement. We will recheck labs in the Fall.  - Vitamin D, Ergocalciferol, (DRISDOL) 1.25 MG (50000 UNIT) CAPS capsule; Take 1 capsule (50,000 Units total) by mouth every 7 (seven) days.  Dispense: 4 capsule; Refill: 0  2. At risk for osteoporosis Karen Gross was given approximately 15 minutes of osteoporosis prevention counseling today. Karen Gross is at risk for osteopenia and osteoporosis due to her Vitamin D deficiency. She was encouraged to take her Vitamin D and follow her higher calcium  diet and increase strengthening exercise to help strengthen her bones and decrease her risk of osteopenia and osteoporosis.  Repetitive spaced learning was employed today to elicit superior memory formation and behavioral change.  3. Class 1 obesity with serious comorbidity and body mass index (BMI) of 30.0 to 30.9 in adult, unspecified obesity type Karen Gross is currently in the action stage of change. As such, her goal is to continue with weight loss efforts. She has agreed to the Category 2 Plan.   Exercise goals: As is.  Behavioral modification strategies: holiday eating strategies .  Karen Gross has agreed to follow-up with our clinic in 2 weeks. She was informed of the importance of frequent follow-up visits to maximize her success with intensive lifestyle modifications for her multiple health conditions.   Objective:   Blood pressure (!) 95/62, pulse 67, temperature 98.1 F (36.7 C), temperature source Oral, height 5\' 6"  (1.676 m), weight 187 lb (84.8 kg), SpO2 95 %. Body mass index is 30.18 kg/m.  General: Cooperative, alert, well developed, in no acute distress. HEENT: Conjunctivae and lids unremarkable. Cardiovascular: Regular rhythm.  Lungs: Normal work of breathing. Neurologic: No focal deficits.   Lab Results  Component Value Date   CREATININE 0.92 02/04/2020   BUN 21 02/04/2020   NA 144 02/04/2020   K 4.1 02/04/2020   CL 108 (H) 02/04/2020   CO2 23 02/04/2020   Lab Results  Component Value Date   ALT 22 02/04/2020   AST 17 02/04/2020   ALKPHOS 116 02/04/2020  BILITOT 0.2 02/04/2020   Lab Results  Component Value Date   HGBA1C 5.1 11/05/2019   HGBA1C 5.3 03/11/2019   HGBA1C 5.3 09/27/2017   HGBA1C 5.2 11/12/2014   Lab Results  Component Value Date   INSULIN 6.5 10/01/2019   Lab Results  Component Value Date   TSH 1.080 02/04/2020   Lab Results  Component Value Date   CHOL 143 11/05/2019   HDL 52 11/05/2019   LDLCALC 80 11/05/2019   TRIG 49  11/05/2019   CHOLHDL 2.8 11/05/2019   Lab Results  Component Value Date   WBC 4.2 11/05/2019   HGB 13.5 11/05/2019   HCT 42.6 11/05/2019   MCV 83 11/05/2019   PLT 245 11/05/2019   No results found for: IRON, TIBC, FERRITIN  Attestation Statements:   Reviewed by clinician on day of visit: allergies, medications, problem list, medical history, surgical history, family history, social history, and previous encounter notes.   I, Burt Knack, am acting as transcriptionist for Quillian Quince, MD.  I have reviewed the above documentation for accuracy and completeness, and I agree with the above. -  Quillian Quince, MD

## 2020-03-11 ENCOUNTER — Ambulatory Visit (INDEPENDENT_AMBULATORY_CARE_PROVIDER_SITE_OTHER): Payer: BC Managed Care – PPO | Admitting: Adult Health

## 2020-03-11 ENCOUNTER — Encounter (INDEPENDENT_AMBULATORY_CARE_PROVIDER_SITE_OTHER): Payer: Self-pay | Admitting: Adult Health

## 2020-03-11 ENCOUNTER — Other Ambulatory Visit: Payer: Self-pay

## 2020-03-11 VITALS — BP 88/60 | HR 76 | Temp 97.7°F | Ht 66.0 in | Wt 185.0 lb

## 2020-03-11 DIAGNOSIS — G40909 Epilepsy, unspecified, not intractable, without status epilepticus: Secondary | ICD-10-CM | POA: Diagnosis not present

## 2020-03-11 DIAGNOSIS — Z6830 Body mass index (BMI) 30.0-30.9, adult: Secondary | ICD-10-CM

## 2020-03-11 DIAGNOSIS — E559 Vitamin D deficiency, unspecified: Secondary | ICD-10-CM

## 2020-03-11 DIAGNOSIS — E669 Obesity, unspecified: Secondary | ICD-10-CM | POA: Diagnosis not present

## 2020-03-11 DIAGNOSIS — Z9189 Other specified personal risk factors, not elsewhere classified: Secondary | ICD-10-CM

## 2020-03-11 MED ORDER — VITAMIN D (ERGOCALCIFEROL) 1.25 MG (50000 UNIT) PO CAPS: 50000.0000 [IU] | ORAL_CAPSULE | ORAL | 0 refills | Status: DC

## 2020-03-15 NOTE — Progress Notes (Signed)
Chief Complaint:   OBESITY Karen Gross is here to discuss her progress with her obesity treatment plan along with follow-up of her obesity related diagnoses. Ramon is on the Category 2 Plan and states she is following her eating plan approximately 85% of the time. [name] states she is exercising 0 minutes 0 times per week.  Today's visit was #: 10 Starting weight: 217 lbs Starting date: 10/01/2019 Today's weight: 185 lbs Today's date: 03/11/2020 Total lbs lost to date: 32 Total lbs lost since last in-office visit: 2  Interim History: Overall [name] has achieved phenomenal results since starting the program - is down a total of 32 lbs with a BMI less than 30 and all labs are stable. 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 She would like to follow-up quarterly due to co-pay expense and that she has met most of her weight loss goals.  Subjective:   Vitamin D deficiency. Karen Gross is on Ergocalciferol. No nausea, vomiting, or muscle weakness.    Ref. Range 02/04/2020 13:10  Vitamin D, 25-Hydroxy Latest Ref Range: 30.0 - 100.0 ng/mL 41.1   Seizure disorder ([place]). Iyesha denies any recent seizure activity. She again declines referral to a neurologist at this time. She is very compliant with Depakote 500 mg daily.  At risk for osteoporosis. [name] is at higher risk of osteopenia and osteoporosis due to Vitamin D deficiency and obesity.   Assessment/Plan:   Vitamin D deficiency. Low Vitamin D level contributes to fatigue and are associated with obesity, breast, and colon cancer. She was given a refill on her Vitamin D, Ergocalciferol, (DRISDOL) 1.25 MG (50000 UNIT) CAPS capsule every week #4 with 0 refills and will follow-up for routine testing of Vitamin D, at least 2-3 times per year to avoid over-replacement.   Seizure disorder ([place]). Levetta will establish with a neurologist if seizure activity occurs  again.  At risk for osteoporosis. Karen Gross was given approximately 15 minutes of osteoporosis prevention counseling today. Karen Gross is at risk for osteopenia and osteoporosis due to her Vitamin D deficiency. She was encouraged to take her Vitamin D and follow her higher calcium diet and increase strengthening exercise to help strengthen her bones and decrease her risk of osteopenia and osteoporosis.  Repetitive spaced learning was employed today to elicit superior memory formation and behavioral change.  Class 1 obesity with serious comorbidity and body mass index (BMI) of 30.0 to 30.9 in adult, unspecified obesity type - BMI greater than 30 at start of program.  [name] is currently in the action stage of change. As such, her goal is to continue with weight loss efforts. She has agreed to the Category 2 Plan.   Exercise goals: No exercise has been prescribed at this time.  Behavioral modification strategies: increasing lean protein intake, meal planning and cooking strategies and planning for success.  Karen Gross has agreed to follow-up with our clinic in 16 weeks. She was informed of the importance of frequent follow-up visits to maximize her success with intensive lifestyle modifications for her multiple health conditions.   Objective:   Blood pressure (!) 88/60, pulse 76, temperature 97.7 F (36.5 C), temperature source Oral, height 5' 6"  (1.676 m), weight 185 lb (83.9 kg), SpO2 100 %. Body mass index is 29.86 kg/m.  General: Cooperative, alert, well developed, in no acute distress. HEENT: Conjunctivae and lids unremarkable. Cardiovascular: Regular rhythm.  Lungs: Normal work of breathing. Neurologic: No focal  deficits.   Lab Results  Component Value Date   CREATININE 0.92 02/04/2020   BUN 21 02/04/2020   NA 144 02/04/2020   K 4.1 02/04/2020   CL 108 (H) 02/04/2020   CO2 23 02/04/2020   Lab Results  Component Value Date   ALT 22 02/04/2020   AST 17 02/04/2020   ALKPHOS 116  02/04/2020   BILITOT 0.2 02/04/2020   Lab Results  Component Value Date   HGBA1C 5.1 11/05/2019   HGBA1C 5.3 03/11/2019   HGBA1C 5.3 09/27/2017   HGBA1C 5.2 11/12/2014   Lab Results  Component Value Date   INSULIN 6.5 10/01/2019   Lab Results  Component Value Date   TSH 1.080 02/04/2020   Lab Results  Component Value Date   CHOL 143 11/05/2019   HDL 52 11/05/2019   LDLCALC 80 11/05/2019   TRIG 49 11/05/2019   CHOLHDL 2.8 11/05/2019   Lab Results  Component Value Date   WBC 4.2 11/05/2019   HGB 13.5 11/05/2019   HCT 42.6 11/05/2019   MCV 83 11/05/2019   PLT 245 11/05/2019   No results found for: IRON, TIBC, FERRITIN  Attestation Statements:   Reviewed by clinician on day of visit: allergies, medications, problem list, medical history, surgical history, family history, social history, and previous encounter notes.  I, Michaelene Song, am acting as Location manager for [employer], NP-C   I have reviewed the above documentation for accuracy and completeness, and I agree with the above. -  Esaw Grandchild, NP

## 2020-04-26 ENCOUNTER — Ambulatory Visit: Payer: BC Managed Care – PPO | Admitting: Physician Assistant

## 2020-04-26 ENCOUNTER — Other Ambulatory Visit: Payer: Self-pay

## 2020-04-26 ENCOUNTER — Encounter: Payer: Self-pay | Admitting: Physician Assistant

## 2020-04-26 VITALS — BP 89/59 | HR 74 | Ht 66.0 in | Wt 186.0 lb

## 2020-04-26 DIAGNOSIS — H6123 Impacted cerumen, bilateral: Secondary | ICD-10-CM

## 2020-04-26 NOTE — Progress Notes (Signed)
Acute Office Visit  Subjective:    Patient ID: Karen Gross, female    DOB: 11-21-1965, 54 y.o.   MRN: [id number]  No chief complaint on file.   HPI Patient is in today for ear irrigation. Reports both ears feel clogged. States she has to have her ears cleaned every so often due to excessive cerumen. Denies otalgia or decreased hearing.  Past Medical History:  Diagnosis Date  . Anxiety   . Bartholin cyst   . GERD (gastroesophageal reflux disease)   . Hip pain   . Hypothyroidism   . Knee pain   . 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 ABDOMINAL HYSTERECTOMY    . KNEE ARTHROSCOPY Right     Family History  Problem Relation Age of Onset  . Breast cancer Mother   . Heart disease Mother   . High blood pressure Mother   . Cancer Mother   . Depression Mother   . Breast cancer Paternal Grandmother   . Colon cancer Neg Hx   . Stomach cancer Neg Hx   . Colon polyps Neg Hx   . Esophageal cancer Neg Hx   . Rectal cancer Neg Hx     Social History   Socioeconomic History  . Marital status: Single    Spouse name: Not on file  . Number of children: Not on file  . Years of education: Not on file  . Highest education level: Not on file  Occupational History  . 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 Smoking status: Never Smoker  . Smokeless tobacco: Never Used  Vaping Use  . Vaping Use: Never used  Substance and Sexual Activity  . Alcohol use: Yes    Comment: once a month- wine  . Drug use: No  . Sexual activity: Yes    Birth control/protection: None  Other Topics Concern  . Not on file  Social History Narrative  . Not on file   Social Determinants of Health   Financial Resource Strain:   . Difficulty of Paying Living Expenses: Not on file  Food Insecurity:   . Worried About Programme researcher, broadcasting/film/video in the Last Year: Not on file  . Ran Out of Food in the Last Year: Not on file   Transportation Needs:   . Lack of Transportation (Medical): Not on file  . Lack of Transportation (Non-Medical): Not on file  Physical Activity:   . Days of Exercise per Week: Not on file  . Minutes of Exercise per Session: Not on file  Stress:   . Feeling of Stress : Not on file  Social Connections:   . Frequency of Communication with Friends and Family: Not on file  . Frequency of Social Gatherings with Friends and Family: Not on file  . Attends Religious Services: Not on file  . Active Member of Clubs or Organizations: Not on file  . Attends Banker Meetings: Not on file  . Marital Status: Not on file  Intimate Partner Violence:   . Fear of Current or Ex-Partner: Not on file  . Emotionally Abused: Not on file  . Physically Abused: Not on file  . Sexually Abused: Not on file    Outpatient Medications Prior to Visit  Medication Sig Dispense Refill  . ARIPiprazole (ABILIFY) 10 MG tablet TAKE 1 TABLET BY MOUTH DAILY. 90 tablet 0  . divalproex (DEPAKOTE)  500 MG DR tablet Take 500 mg by mouth daily.    [name] levothyroxine (SYNTHROID) 100 MCG tablet Take 1 tablet on Mondays and Thursdays only in the morning before breakfast 30 tablet 0  . levothyroxine (SYNTHROID) 112 MCG tablet Take tablet on Sunday, Tuesday, Wednesday, Friday and Saturday in the morning before breakfast 90 tablet 0  . omeprazole (PRILOSEC) 20 MG capsule Take 20 mg by mouth daily.    [name] topiramate (TOPAMAX) 100 MG tablet Take 1 tablet (100 mg total) by mouth 2 (two) times daily. 180 tablet 0  . Vitamin D, Ergocalciferol, (DRISDOL) 1.25 MG (50000 UNIT) CAPS capsule Take 1 capsule (50,000 Units total) by mouth every 7 (seven) days. 4 capsule 0   No facility-administered medications prior to visit.    No Known Allergies  Review of Systems Review of Systems:  A fourteen system review of systems was performed and found to be positive as per HPI.    Objective:    Physical Exam General:  Well Developed,  well nourished, appropriate for stated age.  Neuro:  Alert and oriented,  No focal deficits HEENT:  Normocephalic, atraumatic, cerumen impaction of both ears Skin:  no gross rash, warm, pink. Cardiac:  RRR, S1 S2 Respiratory:  ECTA B/L, Not using accessory muscles, speaking in full sentences- unlabored. Vascular:  Ext warm, no cyanosis apprec.; Psych:  No HI/SI, judgement and insight good, Euthymic mood. Full Affect.   BP (!) 89/59   Pulse 74   Ht 5\' 6"  (1.676 m)   Wt 186 lb (84.4 kg)   SpO2 97%   BMI 30.02 kg/m  Wt Readings from Last 3 Encounters:  04/26/20 186 lb (84.4 kg)  03/11/20 185 lb (83.9 kg)  02/25/20 187 lb (84.8 kg)    Health Maintenance Due  Topic Date Due  . Hepatitis C Screening  Never done  . COVID-19 Vaccine (1) Never done  . INFLUENZA VACCINE  02/29/2020    There are no preventive care reminders to display for this patient.   Lab Results  Component Value Date   TSH 1.080 02/04/2020   Lab Results  Component Value Date   WBC 4.2 11/05/2019   HGB 13.5 11/05/2019   HCT 42.6 11/05/2019   MCV 83 11/05/2019   PLT 245 11/05/2019   Lab Results  Component Value Date   NA 144 02/04/2020   K 4.1 02/04/2020   CO2 23 02/04/2020   GLUCOSE 85 02/04/2020   BUN 21 02/04/2020   CREATININE 0.92 02/04/2020   BILITOT 0.2 02/04/2020   ALKPHOS 116 02/04/2020   AST 17 02/04/2020   ALT 22 02/04/2020   PROT 6.2 02/04/2020   ALBUMIN 3.9 02/04/2020   CALCIUM 9.3 02/04/2020   Lab Results  Component Value Date   CHOL 143 11/05/2019   Lab Results  Component Value Date   HDL 52 11/05/2019   Lab Results  Component Value Date   LDLCALC 80 11/05/2019   Lab Results  Component Value Date   TRIG 49 11/05/2019   Lab Results  Component Value Date   CHOLHDL 2.8 11/05/2019   Lab Results  Component Value Date   HGBA1C 5.1 11/05/2019       Assessment & Plan:   Problem List Items Addressed This Visit    None    Visit Diagnoses    Bilateral impacted  cerumen    -  Primary     Indication: Cerumen impaction of both ears Medical necessity statement:  On physical examination, cerumen  impairs clinically significant portions of the external auditory canal, and tympanic membrane.  Consent:  Discussed benefits and risks of procedure and verbal consent obtained Procedure:   Patient was prepped for the procedure.  Utilized an otoscope to assess and take note of the ear canal, the tympanic membrane, and the presence, amount, and placement of the cerumen. Gentle water irrigation and soft plastic curette was utilized to remove cerumen. Post procedure examination:  shows cerumen was removed, without trauma or injury to the ear canal or TM, which remains intact.   Post-Procedural Ear Care Instructions:    Patient tolerated procedure well.  Proper ear care d/c pt.   The patient is made aware that they may experience temporary vertigo, temporary hearing loss, and temporary discomfort.  If these symptom last for more than 24 hours to call the clinic or proceed to the ED/Urgent Care.  Follow up in 4-5 months for chronic conditions: Hypothyroid, Migraines  No orders of the defined types were placed in this encounter.  Note:  This note was prepared with assistance of Dragon voice recognition software. Occasional wrong-word or sound-a-like substitutions may have occurred due to the inherent limitations of voice recognition software.   Mayer Masker, PA-C

## 2020-04-26 NOTE — Patient Instructions (Signed)
Earwax Buildup, Adult The ears produce a substance called earwax that helps keep bacteria out of the ear and protects the skin in the ear canal. Occasionally, earwax can build up in the ear and cause discomfort or hearing loss. What increases the risk? This condition is more likely to develop in people who:  Are female.  Are elderly.  Naturally produce more earwax.  Clean their ears often with cotton swabs.  Use earplugs often.  Use in-ear headphones often.  Wear hearing aids.  Have narrow ear canals.  Have earwax that is overly thick or sticky.  Have eczema.  Are dehydrated.  Have excess hair in the ear canal. What are the signs or symptoms? Symptoms of this condition include:  Reduced or muffled hearing.  A feeling of fullness in the ear or feeling that the ear is plugged.  Fluid coming from the ear.  Ear pain.  Ear itch.  Ringing in the ear.  Coughing.  An obvious piece of earwax that can be seen inside the ear canal. How is this diagnosed? This condition may be diagnosed based on:  Your symptoms.  Your medical history.  An ear exam. During the exam, your health care provider will look into your ear with an instrument called an otoscope. You may have tests, including a hearing test. How is this treated? This condition may be treated by:  Using ear drops to soften the earwax.  Having the earwax removed by a health care provider. The health care provider may: ? Flush the ear with water. ? Use an instrument that has a loop on the end (curette). ? Use a suction device.  Surgery to remove the wax buildup. This may be done in severe cases. Follow these instructions at home:   Take over-the-counter and prescription medicines only as told by your health care provider.  Do not put any objects, including cotton swabs, into your ear. You can clean the opening of your ear canal with a washcloth or facial tissue.  Follow instructions from your health care  provider about cleaning your ears. Do not over-clean your ears.  Drink enough fluid to keep your urine clear or pale yellow. This will help to thin the earwax.  Keep all follow-up visits as told by your health care provider. If earwax builds up in your ears often or if you use hearing aids, consider seeing your health care provider for routine, preventive ear cleanings. Ask your health care provider how often you should schedule your cleanings.  If you have hearing aids, clean them according to instructions from the manufacturer and your health care provider. Contact a health care provider if:  You have ear pain.  You develop a fever.  You have blood, pus, or other fluid coming from your ear.  You have hearing loss.  You have ringing in your ears that does not go away.  Your symptoms do not improve with treatment.  You feel like the room is spinning (vertigo). Summary  Earwax can build up in the ear and cause discomfort or hearing loss.  The most common symptoms of this condition include reduced or muffled hearing and a feeling of fullness in the ear or feeling that the ear is plugged.  This condition may be diagnosed based on your symptoms, your medical history, and an ear exam.  This condition may be treated by using ear drops to soften the earwax or by having the earwax removed by a health care provider.  Do not put any   objects, including cotton swabs, into your ear. You can clean the opening of your ear canal with a washcloth or facial tissue. This information is not intended to replace advice given to you by your health care provider. Make sure you discuss any questions you have with your health care provider. Document Revised: 06/29/2017 Document Reviewed: 09/27/2016 Elsevier Patient Education  2020 Elsevier Inc.  

## 2020-05-03 ENCOUNTER — Other Ambulatory Visit: Payer: Self-pay | Admitting: Adult Health

## 2020-05-03 ENCOUNTER — Other Ambulatory Visit: Payer: Self-pay | Admitting: Family Medicine

## 2020-05-04 ENCOUNTER — Other Ambulatory Visit: Payer: Self-pay | Admitting: Physician Assistant

## 2020-05-25 ENCOUNTER — Telehealth: Payer: Self-pay | Admitting: Physician Assistant

## 2020-05-25 MED ORDER — LEVOTHYROXINE SODIUM 100 MCG PO TABS: ORAL_TABLET | ORAL | 0 refills | Status: DC

## 2020-05-25 NOTE — Addendum Note (Signed)
Addended by: Stan Head on: 05/25/2020 05:20 PM   Modules accepted: Orders

## 2020-05-25 NOTE — Telephone Encounter (Signed)
Patient needs to know if her synthroid medications were called into [facility]. There are two. Synthroid , and synthroid . Thanks. They go to [facility]

## 2020-07-01 ENCOUNTER — Ambulatory Visit (INDEPENDENT_AMBULATORY_CARE_PROVIDER_SITE_OTHER): Payer: BC Managed Care – PPO | Admitting: Adult Health

## 2020-07-01 ENCOUNTER — Other Ambulatory Visit: Payer: Self-pay

## 2020-07-01 ENCOUNTER — Encounter (INDEPENDENT_AMBULATORY_CARE_PROVIDER_SITE_OTHER): Payer: Self-pay | Admitting: Adult Health

## 2020-07-01 VITALS — BP 106/67 | HR 75 | Temp 97.6°F | Ht 66.0 in | Wt 176.0 lb

## 2020-07-01 DIAGNOSIS — E669 Obesity, unspecified: Secondary | ICD-10-CM

## 2020-07-01 DIAGNOSIS — G40909 Epilepsy, unspecified, not intractable, without status epilepticus: Secondary | ICD-10-CM

## 2020-07-01 DIAGNOSIS — Z0000 Encounter for general adult medical examination without abnormal findings: Secondary | ICD-10-CM

## 2020-07-01 DIAGNOSIS — Z6830 Body mass index (BMI) 30.0-30.9, adult: Secondary | ICD-10-CM

## 2020-07-01 DIAGNOSIS — Z9189 Other specified personal risk factors, not elsewhere classified: Secondary | ICD-10-CM

## 2020-07-01 DIAGNOSIS — E559 Vitamin D deficiency, unspecified: Secondary | ICD-10-CM | POA: Diagnosis not present

## 2020-07-01 NOTE — Progress Notes (Signed)
Chief Complaint:   OBESITY Karen Gross is here to discuss her progress with her obesity treatment plan along with follow-up of her obesity related diagnoses. Karen Gross is on the Category 2 Plan and states she is following her eating plan approximately 90% of the time. Karen Gross states she is exercising 0 minutes 0 times per week.  Today's visit was #: 11 Starting weight: 217 lbs Starting date: 10/01/2019 Today's weight: 176 lbs Today's date: 07/01/2020 Total lbs lost to date: 41 Total lbs lost since last in-office visit: 9  Interim History: Karen Gross is scheduled for an MRI on 07/03/2020 for midline thoracic back pain. Pain gradually developed 3 months ago. She denies acute trauma/injury prior to onset of pain. She is followed by [facility]. She continues to follow the Category 2 meal plan with fantastic results.  Subjective:   Vitamin D deficiency. Last Vitamin D level was 41.1 on 02/04/2020, below goal of 50. Karen Gross is on Ergocalciferol. No nausea, vomiting, or muscle weakness.    Ref. Range 02/04/2020 13:10  Vitamin D, 25-Hydroxy Latest Ref Range: 30.0 - 100.0 ng/mL 41.1   Seizure disorder (HCC). Karen Gross denies any recent seizure activity. She is on Depakote 500 mg daily, which she is tolerating well.  Healthcare maintenance. Karen Gross is on multiple maintenance medications. Will draw CMP today to check renal/hepatic function.  At risk for osteoporosis. Karen Gross is at higher risk of osteopenia and osteoporosis due to Vitamin D deficiency and being overweight.    Assessment/Plan:   Vitamin D deficiency. Low Vitamin D level contributes to fatigue and are associated with obesity, breast, and colon cancer. She was given a refill on her Vitamin D @50 ,000 IU every week #4 with 0 refills and VITAMIN D 25 Hydroxy (Vit-D Deficiency, Fractures) level will be checked today.   Seizure disorder (HCC). Karen Gross will continue Depakote as directed.   Healthcare  maintenance.  Comprehensive metabolic panel will be checked today.  At risk for osteoporosis. Karen Gross was given approximately 15 minutes of osteoporosis prevention counseling today. Karen Gross is at risk for osteopenia and osteoporosis due to her Vitamin D deficiency. She was encouraged to take her Vitamin D and follow her higher calcium diet and increase strengthening exercise to help strengthen her bones and decrease her risk of osteopenia and osteoporosis.  Repetitive spaced learning was employed today to elicit superior memory formation and behavioral change.  Class 1 obesity with serious comorbidity and body mass index (BMI) of 30.0 to 30.9 in adult, unspecified obesity type - BMI greater than 30 at the start of program.  Karen Gross is currently in the action stage of change. As such, her goal is to continue with weight loss efforts. She has agreed to the Category 2 Plan.   Exercise goals: No exercise has been prescribed at this time.  Behavioral modification strategies: increasing lean protein intake, meal planning and cooking strategies and planning for success.  Karen Gross has agreed to follow-up with our clinic in 12 weeks. She was informed of the importance of frequent follow-up visits to maximize her success with intensive lifestyle modifications for her multiple health conditions.   Karen Gross was informed we would discuss her lab results at her next visit unless there is a critical issue that needs to be addressed sooner. Karen Gross agreed to keep her next visit at the agreed upon time to discuss these results.  Objective:   Blood pressure 106/67, pulse 75, temperature 97.6 F (36.4 C), height 5\' 6"  (1.676 m), weight 176  lb (79.8 kg), SpO2 98 %. Body mass index is 28.41 kg/m.  General: Cooperative, alert, well developed, in no acute distress. HEENT: Conjunctivae and lids unremarkable. Cardiovascular: Regular rhythm.  Lungs: Normal work of breathing. Neurologic: No focal deficits.   Lab  Results  Component Value Date   CREATININE 0.92 02/04/2020   BUN 21 02/04/2020   NA 144 02/04/2020   K 4.1 02/04/2020   CL 108 (H) 02/04/2020   CO2 23 02/04/2020   Lab Results  Component Value Date   ALT 22 02/04/2020   AST 17 02/04/2020   ALKPHOS 116 02/04/2020   BILITOT 0.2 02/04/2020   Lab Results  Component Value Date   HGBA1C 5.1 11/05/2019   HGBA1C 5.3 03/11/2019   HGBA1C 5.3 09/27/2017   HGBA1C 5.2 11/12/2014   Lab Results  Component Value Date   INSULIN 6.5 10/01/2019   Lab Results  Component Value Date   TSH 1.080 02/04/2020   Lab Results  Component Value Date   CHOL 143 11/05/2019   HDL 52 11/05/2019   LDLCALC 80 11/05/2019   TRIG 49 11/05/2019   CHOLHDL 2.8 11/05/2019   Lab Results  Component Value Date   WBC 4.2 11/05/2019   HGB 13.5 11/05/2019   HCT 42.6 11/05/2019   MCV 83 11/05/2019   PLT 245 11/05/2019   No results found for: IRON, TIBC, FERRITIN  Attestation Statements:   Reviewed by clinician on day of visit: allergies, medications, problem list, medical history, surgical history, family history, social history, and previous encounter notes.  I, Karen Gross, am acting as Energy manager for [employer], NP-C   I have reviewed the above documentation for accuracy and completeness, and I agree with the above. -  Karen Gross d. Karen Simmon, NP-C

## 2020-07-02 LAB — COMPREHENSIVE METABOLIC PANEL
ALT: 18 IU/L (ref 0–32)
AST: 17 IU/L (ref 0–40)
Albumin/Globulin Ratio: 1.8 (ref 1.2–2.2)
Albumin: 4.1 g/dL (ref 3.8–4.9)
Alkaline Phosphatase: 81 IU/L (ref 44–121)
BUN/Creatinine Ratio: 27 — ABNORMAL HIGH (ref 9–23)
BUN: 25 mg/dL — ABNORMAL HIGH (ref 6–24)
Bilirubin Total: 0.4 mg/dL (ref 0.0–1.2)
CO2: 25 mmol/L (ref 20–29)
Calcium: 9.4 mg/dL (ref 8.7–10.2)
Chloride: 107 mmol/L — ABNORMAL HIGH (ref 96–106)
Creatinine, Ser: 0.92 mg/dL (ref 0.57–1.00)
GFR calc Af Amer: 83 mL/min/{1.73_m2} (ref >59)
GFR calc non Af Amer: 72 mL/min/{1.73_m2} (ref >59)
Globulin, Total: 2.3 g/dL (ref 1.5–4.5)
Glucose: 76 mg/dL (ref 65–99)
Potassium: 4.9 mmol/L (ref 3.5–5.2)
Sodium: 144 mmol/L (ref 134–144)
Total Protein: 6.4 g/dL (ref 6.0–8.5)

## 2020-07-02 LAB — VITAMIN D 25 HYDROXY (VIT D DEFICIENCY, FRACTURES): Vit D, 25-Hydroxy: 64.9 ng/mL (ref 30.0–100.0)

## 2020-07-06 ENCOUNTER — Other Ambulatory Visit (INDEPENDENT_AMBULATORY_CARE_PROVIDER_SITE_OTHER): Payer: Self-pay | Admitting: Family Medicine

## 2020-07-06 DIAGNOSIS — E559 Vitamin D deficiency, unspecified: Secondary | ICD-10-CM

## 2020-07-06 NOTE — Telephone Encounter (Signed)
Refill request

## 2020-07-19 ENCOUNTER — Other Ambulatory Visit: Payer: Self-pay | Admitting: Obstetrics and Gynecology

## 2020-07-19 DIAGNOSIS — Z0000 Encounter for general adult medical examination without abnormal findings: Secondary | ICD-10-CM

## 2020-07-29 ENCOUNTER — Other Ambulatory Visit (INDEPENDENT_AMBULATORY_CARE_PROVIDER_SITE_OTHER): Payer: Self-pay | Admitting: Adult Health

## 2020-07-29 DIAGNOSIS — E559 Vitamin D deficiency, unspecified: Secondary | ICD-10-CM

## 2020-08-09 ENCOUNTER — Telehealth: Payer: Self-pay | Admitting: Physician Assistant

## 2020-08-09 MED ORDER — PREDNISONE 10 MG PO TABS: 10.0000 mg | ORAL_TABLET | Freq: Every day | ORAL | 0 refills | Status: DC

## 2020-08-09 NOTE — Telephone Encounter (Signed)
Sending Prednisone 10mg   1po QD x 5 days per [facility]. Patient is aware. AS, CMA

## 2020-08-09 NOTE — Telephone Encounter (Signed)
Patient needs an antibiotic for bronchitis and I advised her urgent care is the best option as she needs an appointment for an antibiotic and our schedule is full for a few weeks. Thanks

## 2020-08-09 NOTE — Addendum Note (Signed)
Addended by: Sylvester Harder on: 08/09/2020 11:56 AM   Modules accepted: Orders

## 2020-08-20 ENCOUNTER — Other Ambulatory Visit (INDEPENDENT_AMBULATORY_CARE_PROVIDER_SITE_OTHER): Payer: Self-pay | Admitting: Adult Health

## 2020-08-20 DIAGNOSIS — E559 Vitamin D deficiency, unspecified: Secondary | ICD-10-CM

## 2020-09-10 ENCOUNTER — Ambulatory Visit: Payer: BC Managed Care – PPO

## 2020-09-10 ENCOUNTER — Ambulatory Visit
Admission: RE | Admit: 2020-09-10 | Discharge: 2020-09-10 | Disposition: A | Discharge status: Home / Self Care | Payer: BC Managed Care – PPO | Source: Ambulatory Visit | Attending: Obstetrics and Gynecology | Admitting: Obstetrics and Gynecology

## 2020-09-10 ENCOUNTER — Other Ambulatory Visit: Payer: Self-pay

## 2020-09-10 DIAGNOSIS — Z0000 Encounter for general adult medical examination without abnormal findings: Secondary | ICD-10-CM

## 2020-10-04 ENCOUNTER — Encounter: Payer: Self-pay | Admitting: Physician Assistant

## 2020-10-04 ENCOUNTER — Other Ambulatory Visit: Payer: Self-pay

## 2020-10-04 ENCOUNTER — Ambulatory Visit (INDEPENDENT_AMBULATORY_CARE_PROVIDER_SITE_OTHER): Payer: BC Managed Care – PPO | Admitting: Physician Assistant

## 2020-10-04 VITALS — BP 97/66 | HR 73 | Temp 97.6°F | Ht 66.0 in | Wt 177.4 lb

## 2020-10-04 DIAGNOSIS — R252 Cramp and spasm: Secondary | ICD-10-CM | POA: Diagnosis not present

## 2020-10-04 DIAGNOSIS — E038 Other specified hypothyroidism: Secondary | ICD-10-CM

## 2020-10-04 NOTE — Progress Notes (Signed)
Acute Office Visit  Subjective:    Patient ID: Karen Gross, female    DOB: [birth year], 55 y.o.   MRN: [id number]  No chief complaint on file.   HPI Patient is in today for leg cramps which usually occur at night. Reports 2 nights ago had a severe episode where she could not walk. Drinks pickle juice which helps with symptoms. Denies starting new medications, paresthesias, fall or injury, erythema or swelling. Sometimes has hand cramps which are not as severe as leg cramps. Reports good hydration. Does not drink caffeine.   Past Medical History:  Diagnosis Date  . Anxiety   . Bartholin cyst   . GERD (gastroesophageal reflux disease)   . Hip pain   . Hypothyroidism   . Knee pain   . Seizures ([place])    Stress Induced- last seizure 5 + years ago     Past Surgical History:  Procedure Laterality Date  . ABDOMINAL HYSTERECTOMY    . KNEE ARTHROSCOPY Right     Family History  Problem Relation Age of Onset  . Breast cancer Mother   . Heart disease Mother   . High blood pressure Mother   . Cancer Mother   . Depression Mother   . Breast cancer Paternal Grandmother   . Colon cancer Neg Hx   . Stomach cancer Neg Hx   . Colon polyps Neg Hx   . Esophageal cancer Neg Hx   . Rectal cancer Neg Hx     Social History   Socioeconomic History  . Marital status: Single    Spouse name: Not on file  . Number of children: Not on file  . Years of education: Not on file  . Highest education level: Not on file  Occupational History  . 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 Smoking status: Never Smoker  . Smokeless tobacco: Never Used  Vaping Use  . Vaping Use: Never used  Substance and Sexual Activity  . Alcohol use: Yes    Comment: once a month- wine  . Drug use: No  . Sexual activity: Yes    Birth control/protection: None  Other Topics Concern  . Not on file  Social History Narrative  . Not on file   Social Determinants of Health   Financial Resource Strain: Not on file   Food Insecurity: Not on file  Transportation Needs: Not on file  Physical Activity: Not on file  Stress: Not on file  Social Connections: Not on file  Intimate Partner Violence: Not on file    Outpatient Medications Prior to Visit  Medication Sig Dispense Refill  . ARIPiprazole (ABILIFY) 10 MG tablet TAKE 1 TABLET BY MOUTH DAILY. 90 tablet 0  . divalproex (DEPAKOTE) 500 MG DR tablet Take 500 mg by mouth daily.    [name] levothyroxine (SYNTHROID) 100 MCG tablet Take 1 tablet on Mondays and Thursdays only in the morning before breakfast 90 tablet 0  . levothyroxine (SYNTHROID) 112 MCG tablet TAKE 1 TABLET BY MOUTH DAILY BEFORE BREAKFAST. 90 tablet 0  . omeprazole (PRILOSEC) 20 MG capsule Take 20 mg by mouth daily.    . predniSONE (DELTASONE) 10 MG tablet Take 1 tablet (10 mg total) by mouth daily with breakfast. 5 tablet 0  . topiramate (TOPAMAX) 100 MG tablet Take 1 tablet (100 mg total) by mouth 2 (two) times daily. 180 tablet 0  . Vitamin D, Ergocalciferol, (DRISDOL) 1.25 MG (50000 UNIT) CAPS capsule TAKE 1 CAPSULE BY MOUTH EVERY 7 DAYS. 4  capsule 0   No facility-administered medications prior to visit.    No Known Allergies  Review of Systems A fourteen system review of systems was performed and found to be positive as per HPI.  Objective:    Physical Exam General:  Well Developed, well nourished, appropriate for stated age.  Neuro:  Alert and oriented,  extra-ocular muscles intact  HEENT:  Normocephalic, atraumatic, neck supple Skin:  no gross rash, warm, pink. Cardiac:  RRR, S1 S2 wnl's  Respiratory:  ECTA B/L, Not using accessory muscles, speaking in full sentences- unlabored. Vascular:  Ext warm, no cyanosis apprec.; no varicosities noted  Psych:  No HI/SI, judgement and insight good, Euthymic mood. Full Affect.  BP 97/66   Pulse 73   Temp 97.6 F (36.4 C)   Ht 5' 6"  (1.676 m)   Wt 177 lb 6.4 oz (80.5 kg)   SpO2 97%   BMI 28.63 kg/m  Wt Readings from Last 3  Encounters:  10/04/20 177 lb 6.4 oz (80.5 kg)  07/01/20 176 lb (79.8 kg)  04/26/20 186 lb (84.4 kg)    Health Maintenance Due  Topic Date Due  . Hepatitis C Screening  Never done  . COVID-19 Vaccine (1) Never done    There are no preventive care reminders to display for this patient.   Lab Results  Component Value Date   TSH 1.080 02/04/2020   Lab Results  Component Value Date   WBC 4.2 11/05/2019   HGB 13.5 11/05/2019   HCT 42.6 11/05/2019   MCV 83 11/05/2019   PLT 245 11/05/2019   Lab Results  Component Value Date   NA 144 07/01/2020   K 4.9 07/01/2020   CO2 25 07/01/2020   GLUCOSE 76 07/01/2020   BUN 25 (H) 07/01/2020   CREATININE 0.92 07/01/2020   BILITOT 0.4 07/01/2020   ALKPHOS 81 07/01/2020   AST 17 07/01/2020   ALT 18 07/01/2020   PROT 6.4 07/01/2020   ALBUMIN 4.1 07/01/2020   CALCIUM 9.4 07/01/2020   Lab Results  Component Value Date   CHOL 143 11/05/2019   Lab Results  Component Value Date   HDL 52 11/05/2019   Lab Results  Component Value Date   LDLCALC 80 11/05/2019   Lab Results  Component Value Date   TRIG 49 11/05/2019   Lab Results  Component Value Date   CHOLHDL 2.8 11/05/2019   Lab Results  Component Value Date   HGBA1C 5.1 11/05/2019       Assessment & Plan:   Problem List Items Addressed This Visit      Endocrine   Hypothyroidism   Relevant Orders   TSH    Other Visit Diagnoses    Nocturnal muscle cramps    -  Primary   Relevant Orders   Comp Met (CMET)   CBC w/Diff   Ferritin   Magnesium   B12 and Folate Panel     Nocturnal muscle cramps: -Discussed with patient potential etiologies and will collect labs to evaluate for nutritional deficiency, electrolyte imbalance, iron deficiency or endocrine cause. If labs reveal no clear cause then we can trial treatment options for RLS.  -Recommend preventative measures before bedtime such as gentle stretches, local massage, and/or hot/cold packs. -Continue good  hydration.  No orders of the defined types were placed in this encounter.    Lorrene Reid, PA-C

## 2020-10-04 NOTE — Patient Instructions (Signed)
Leg Cramps Leg cramps occur when one or more muscles tighten and a person has no control over it (involuntary muscle contraction). Muscle cramps are most common in the calf muscles of the leg. They can occur during exercise or at rest. Leg cramps are painful, and they may last for a few seconds to a few minutes. Cramps may return several times before they finally stop. Usually, leg cramps are not caused by a serious medical problem. In many cases, the cause is not known. Some common causes include:  Excessive physical effort (overexertion), such as during intense exercise.  Doing the same motion over and over.  Staying in a certain position for a long period of time.  Improper preparation, form, or technique while doing a sport or an activity.  Dehydration.  Injury.  Side effects of certain medicines.  Abnormally low levels of minerals in your blood (electrolytes), especially potassium and calcium. This could result from: ? Pregnancy. ? Taking diuretic medicines. Follow these instructions at home: Eating and drinking  Drink enough fluid to keep your urine pale yellow. Staying hydrated may help prevent cramps.  Eat a healthy diet that includes plenty of nutrients to help your muscles function. A healthy diet includes fruits and vegetables, lean protein, whole grains, and low-fat or nonfat dairy products. Managing pain, stiffness, and swelling  Try massaging, stretching, and relaxing the affected muscle. Do this for several minutes at a time.  If directed, put ice on areas that are sore or painful after a cramp. To do this: ? Put ice in a plastic bag. ? Place a towel between your skin and the bag. ? Leave the ice on for 20 minutes, 2-3 times a day. ? Remove the ice if your skin turns bright red. This is very important. If you cannot feel pain, heat, or cold, you have a greater risk of damage to the area.  If directed, apply heat to muscles that are tense or tight. Do this before  you exercise, or as often as told by your health care provider. Use the heat source that your health care provider recommends, such as a moist heat pack or a heating pad. To do this: ? Place a towel between your skin and the heat source. ? Leave the heat on for 20-30 minutes. ? Remove the heat if your skin turns bright red. This is especially important if you are unable to feel pain, heat, or cold. You may have a greater risk of getting burned.  Try taking hot showers or baths to help relax tight muscles.      General instructions  If you are having frequent leg cramps, avoid intense exercise for several days.  Take over-the-counter and prescription medicines only as told by your health care provider.  Keep all follow-up visits. This is important. Contact a health care provider if:  Your leg cramps get more severe or more frequent, or they do not improve over time.  Your foot becomes cold, numb, or blue. Summary  Muscle cramps can develop in any muscle, but the most common place is in the calf muscles of the leg.  Leg cramps are painful, and they may last for a few seconds to a few minutes.  Usually, leg cramps are not caused by a serious medical problem. Often, the cause is not known.  Stay hydrated, and take over-the-counter and prescription medicines only as told by your health care provider. This information is not intended to replace advice given to you by your   health care provider. Make sure you discuss any questions you have with your health care provider. Document Revised: 12/03/2019 Document Reviewed: 12/03/2019 Elsevier Patient Education  2021 Elsevier Inc.  

## 2020-10-05 LAB — COMPREHENSIVE METABOLIC PANEL
ALT: 17 IU/L (ref 0–32)
AST: 17 IU/L (ref 0–40)
Albumin/Globulin Ratio: 2 (ref 1.2–2.2)
Albumin: 4 g/dL (ref 3.8–4.9)
Alkaline Phosphatase: 79 IU/L (ref 44–121)
BUN/Creatinine Ratio: 19 (ref 9–23)
BUN: 18 mg/dL (ref 6–24)
Bilirubin Total: 0.3 mg/dL (ref 0.0–1.2)
CO2: 20 mmol/L (ref 20–29)
Calcium: 9.3 mg/dL (ref 8.7–10.2)
Chloride: 110 mmol/L — ABNORMAL HIGH (ref 96–106)
Creatinine, Ser: 0.96 mg/dL (ref 0.57–1.00)
Globulin, Total: 2 g/dL (ref 1.5–4.5)
Glucose: 95 mg/dL (ref 65–99)
Potassium: 4.1 mmol/L (ref 3.5–5.2)
Sodium: 144 mmol/L (ref 134–144)
Total Protein: 6 g/dL (ref 6.0–8.5)
eGFR: 71 mL/min/{1.73_m2} (ref >59)

## 2020-10-05 LAB — CBC WITH DIFFERENTIAL/PLATELET
Basophils Absolute: 0 10*3/uL (ref 0.0–0.2)
Basos: 1 %
EOS (ABSOLUTE): 0.1 10*3/uL (ref 0.0–0.4)
Eos: 3 %
Hematocrit: 45.8 % (ref 34.0–46.6)
Hemoglobin: 15.3 g/dL (ref 11.1–15.9)
Immature Grans (Abs): 0 10*3/uL (ref 0.0–0.1)
Immature Granulocytes: 0 %
Lymphocytes Absolute: 1.7 10*3/uL (ref 0.7–3.1)
Lymphs: 32 %
MCH: 31.5 pg (ref 26.6–33.0)
MCHC: 33.4 g/dL (ref 31.5–35.7)
MCV: 94 fL (ref 79–97)
Monocytes Absolute: 0.4 10*3/uL (ref 0.1–0.9)
Monocytes: 7 %
Neutrophils Absolute: 3 10*3/uL (ref 1.4–7.0)
Neutrophils: 57 %
Platelets: 214 10*3/uL (ref 150–450)
RBC: 4.86 x10E6/uL (ref 3.77–5.28)
RDW: 12.7 % (ref 11.7–15.4)
WBC: 5.3 10*3/uL (ref 3.4–10.8)

## 2020-10-05 LAB — B12 AND FOLATE PANEL
Folate: 6.9 ng/mL (ref >3.0)
Vitamin B-12: 309 pg/mL (ref 232–1245)

## 2020-10-05 LAB — MAGNESIUM: Magnesium: 2.2 mg/dL (ref 1.6–2.3)

## 2020-10-05 LAB — TSH: TSH: 1.8 u[IU]/mL (ref 0.450–4.500)

## 2020-10-05 LAB — FERRITIN: Ferritin: 24 ng/mL (ref 15–150)

## 2020-10-27 ENCOUNTER — Telehealth: Payer: Self-pay | Admitting: Physician Assistant

## 2020-10-27 NOTE — Telephone Encounter (Signed)
Patient was in office recently and would like to know if the results have came back, and the bloodwork for cramps. Patient also would like the bloodwork sent to another doctor if possible Karen Gross at [facility]. [email or web address] Fax number [phone]. Please advise, thanks.

## 2020-10-27 NOTE — Telephone Encounter (Signed)
Patient is aware of lab results and verbalized understanding.   Please forward labs from March to below provider per patient request. AS, CMA

## 2020-10-28 ENCOUNTER — Other Ambulatory Visit: Payer: Self-pay

## 2020-10-28 ENCOUNTER — Ambulatory Visit (INDEPENDENT_AMBULATORY_CARE_PROVIDER_SITE_OTHER): Payer: BC Managed Care – PPO | Admitting: Adult Health

## 2020-10-28 ENCOUNTER — Encounter (INDEPENDENT_AMBULATORY_CARE_PROVIDER_SITE_OTHER): Payer: Self-pay | Admitting: Adult Health

## 2020-10-28 VITALS — BP 97/63 | HR 70 | Temp 97.6°F | Ht 66.0 in | Wt 170.0 lb

## 2020-10-28 DIAGNOSIS — Z6835 Body mass index (BMI) 35.0-35.9, adult: Secondary | ICD-10-CM | POA: Diagnosis not present

## 2020-10-28 DIAGNOSIS — R252 Cramp and spasm: Secondary | ICD-10-CM | POA: Diagnosis not present

## 2020-10-28 DIAGNOSIS — Z9189 Other specified personal risk factors, not elsewhere classified: Secondary | ICD-10-CM | POA: Diagnosis not present

## 2020-10-28 DIAGNOSIS — E559 Vitamin D deficiency, unspecified: Secondary | ICD-10-CM

## 2020-10-29 LAB — MAGNESIUM: Magnesium: 2.3 mg/dL (ref 1.6–2.3)

## 2020-10-29 LAB — VITAMIN D 25 HYDROXY (VIT D DEFICIENCY, FRACTURES): Vit D, 25-Hydroxy: 32.9 ng/mL (ref 30.0–100.0)

## 2020-11-01 ENCOUNTER — Other Ambulatory Visit: Payer: Self-pay

## 2020-11-01 ENCOUNTER — Other Ambulatory Visit (INDEPENDENT_AMBULATORY_CARE_PROVIDER_SITE_OTHER): Payer: Self-pay | Admitting: Adult Health

## 2020-11-01 ENCOUNTER — Encounter: Payer: Self-pay | Admitting: Nurse Practitioner

## 2020-11-01 ENCOUNTER — Ambulatory Visit (INDEPENDENT_AMBULATORY_CARE_PROVIDER_SITE_OTHER): Payer: BC Managed Care – PPO | Admitting: Nurse Practitioner

## 2020-11-01 VITALS — BP 101/67 | HR 75 | Temp 97.4°F | Ht 66.5 in | Wt 180.6 lb

## 2020-11-01 DIAGNOSIS — R252 Cramp and spasm: Secondary | ICD-10-CM | POA: Insufficient documentation

## 2020-11-01 DIAGNOSIS — W57XXXA Bitten or stung by nonvenomous insect and other nonvenomous arthropods, initial encounter: Secondary | ICD-10-CM | POA: Diagnosis not present

## 2020-11-01 DIAGNOSIS — E559 Vitamin D deficiency, unspecified: Secondary | ICD-10-CM

## 2020-11-01 DIAGNOSIS — S70361A Insect bite (nonvenomous), right thigh, initial encounter: Secondary | ICD-10-CM

## 2020-11-01 MED ORDER — VITAMIN D (ERGOCALCIFEROL) 1.25 MG (50000 UNIT) PO CAPS: 1.0000 | ORAL_CAPSULE | ORAL | 0 refills | Status: DC

## 2020-11-01 NOTE — Progress Notes (Signed)
Chief Complaint:   OBESITY Karen Gross is here to discuss her progress with her obesity treatment plan along with follow-up of her obesity related diagnoses. Karen Gross is on the Category 2 Plan and states she is following her eating plan approximately 70-80% of the time. Karen Gross states she is walking 30 minutes 5 times per week.  Today's visit was #: 12 Starting weight: 217 lbs Starting date: 10/01/2019 Today's weight: 170 lbs Today's date: 10/28/2020 Total lbs lost to date: 47 lbs Total lbs lost since last in-office visit: 6 lbs  Interim History: Karen Gross continues to follow tenants of category 2 meal plan with very seldom treats/meals off plan. She is down another 6 lbs for a total of 47 lbs with a current BMI of 27!   Subjective:   1. Vitamin D deficiency Karen Gross's Vitamin D level was 64.9 on 07/01/2020. We will need to check labs prior to refilling prescription Vit D to prevent over-replacement.   Ref. Range 07/01/2020 07:45  Vitamin D, 25-Hydroxy Latest Ref Range: 30.0 - 100.0 ng/mL 64.9   2. Leg cramps Karen Gross has been experiencing intermittent leg cramps. She is not on statin therapy. Her 10/04/2020 potassium level is normal at 4.1.   Ref. Range 10/04/2020 08:33  Potassium Latest Ref Range: 3.5 - 5.2 mmol/L 4.1   3. At risk for complication associated with hypotension Karen Gross is at risk for complication associated with hypotension due to steady weight loss.  Assessment/Plan:   1. Vitamin D deficiency Low Vitamin D level contributes to fatigue and are associated with obesity, breast, and colon cancer. She agrees to check labs today, prior to refilling prescription Vit D. We will contact pt with results and appropriate supplementation. She will follow-up for routine testing of Vitamin D, at least 2-3 times per year to avoid over-replacement.  - VITAMIN D 25 Hydroxy (Vit-D Deficiency, Fractures)  2. Leg cramps Check magnesium level today. Take OTC multivitamin.  -  Magnesium  3. At risk for complication associated with hypotension Karen Gross was given approximately 15 minutes of education and counseling today to help avoid hypotension. We discussed risks of hypotension with weight loss and signs of hypotension such as feeling lightheaded or unsteady.  Repetitive spaced learning was employed today to elicit superior memory formation and behavioral change.  4. Current BMI 27.5 Karen Gross is currently in the action stage of change. As such, her goal is to continue with weight loss efforts. She has agreed to the Category 2 Plan.   Exercise goals: As is  Behavioral modification strategies: increasing lean protein intake, decreasing simple carbohydrates, meal planning and cooking strategies and planning for success.  Karen Gross has agreed to follow-up with our clinic in 12 weeks. She was informed of the importance of frequent follow-up visits to maximize her success with intensive lifestyle modifications for her multiple health conditions.   Karen Gross was informed we would discuss her lab results at her next visit unless there is a critical issue that needs to be addressed sooner. Karen Gross agreed to keep her next visit at the agreed upon time to discuss these results.  Objective:   Blood pressure 97/63, pulse 70, temperature 97.6 F (36.4 C), height 5\' 6"  (1.676 m), weight 170 lb (77.1 kg), SpO2 100 %. Body mass index is 27.44 kg/m.  General: Cooperative, alert, well developed, in no acute distress. HEENT: Conjunctivae and lids unremarkable. Cardiovascular: Regular rhythm.  Lungs: Normal work of breathing. Neurologic: No focal deficits.   Lab Results  Component Value Date  CREATININE 0.96 10/04/2020   BUN 18 10/04/2020   NA 144 10/04/2020   K 4.1 10/04/2020   CL 110 (H) 10/04/2020   CO2 20 10/04/2020   Lab Results  Component Value Date   ALT 17 10/04/2020   AST 17 10/04/2020   ALKPHOS 79 10/04/2020   BILITOT 0.3 10/04/2020   Lab Results   Component Value Date   HGBA1C 5.1 11/05/2019   HGBA1C 5.3 03/11/2019   HGBA1C 5.3 09/27/2017   HGBA1C 5.2 11/12/2014   Lab Results  Component Value Date   INSULIN 6.5 10/01/2019   Lab Results  Component Value Date   TSH 1.800 10/04/2020   Lab Results  Component Value Date   CHOL 143 11/05/2019   HDL 52 11/05/2019   LDLCALC 80 11/05/2019   TRIG 49 11/05/2019   CHOLHDL 2.8 11/05/2019   Lab Results  Component Value Date   WBC 5.3 10/04/2020   HGB 15.3 10/04/2020   HCT 45.8 10/04/2020   MCV 94 10/04/2020   PLT 214 10/04/2020   Lab Results  Component Value Date   FERRITIN 24 10/04/2020     Attestation Statements:   Reviewed by clinician on day of visit: allergies, medications, problem list, medical history, surgical history, family history, social history, and previous encounter notes.  Edmund Hilda, am acting as Energy manager for William Hamburger, NP.  I have reviewed the above documentation for accuracy and completeness, and I agree with the above. -  Deklynn Charlet d. Lawrie Tunks, NP-C

## 2020-11-01 NOTE — Patient Instructions (Signed)
Tick Bite Information, Adult  Ticks are insects that can bite. Most ticks live in shrubs and grassy areas. They climb onto people and animals that go by. Then they bite. Some ticks carry germs that can make you sick. How can I prevent tick bites? Take these steps: Use insect repellent  Use an insect repellent that has 20% or higher of the ingredients DEET, picaridin, or IR3535. Follow the instructions on the label. Put it on: ? Bare skin. ? The tops of your boots. ? Your pant legs. ? The ends of your sleeves.  If you use an insect repellent that has the ingredient permethrin, follow the instructions on the label. Put it on: ? Clothing. ? Boots. ? Supplies or outdoor gear. ? Tents. When you are outside  Wear long sleeves and long pants.  Wear light-colored clothes.  Tuck your pant legs into your socks.  Stay in the middle of the trail. Do not touch the bushes.  Avoid walking through long grass.  Check for ticks on your clothes, hair, and skin often while you are outside. Before going inside your house, check your clothes, skin, head, neck, armpits, waist, groin, and joint areas. When you go indoors  Check your clothes for ticks. Dry your clothes in a dryer on high heat for 10 minutes or more. If clothes are damp, additional time may be needed.  Wash your clothes right away if they need to be washed. Use hot water.  Check your pets and outdoor gear.  Shower right away.  Check your body for ticks. Do a full body check using a mirror. What is the right way to remove a tick? Remove the tick from your skin as soon as possible. Do not remove the tick with your bare fingers.  To remove a tick that is crawling on your skin: ? Go outdoors and brush the tick off. ? Use tape or a lint roller.  To remove a tick that is biting: 1. Wash your hands. 2. If you have latex gloves, put them on. 3. Use tweezers, curved forceps, or a tick-removal tool to grasp the tick. Grasp the tick  as close to your skin and as close to the tick's head as possible. 4. Gently pull up until the tick lets go.  Try to keep the tick's head attached to its body.  Do not twist or jerk the tick.  Do not squeeze or crush the tick. Do not try to remove a tick with heat, alcohol, petroleum jelly, or fingernail polish.   What should I do after taking out a tick?  Throw away the tick. Do not crush a tick with your fingers.  Clean the bite area and your hands with soap and water, rubbing alcohol, or an iodine wash.  If an antiseptic cream or ointment is available, apply a small amount to the bite area.  Wash and disinfect any instruments that you used to remove the tick. How should I get rid of a live tick? To dispose of a live tick, use one of these methods:  Place the tick in rubbing alcohol.  Place the tick in a bag or container you can close tightly.  Wrap the tick tightly in tape.  Flush the tick down the toilet. Contact a doctor if:  You have symptoms, such as: ? A fever or chills. ? A red rash that makes a circle (bull's-eye rash) in the bite area. ? Redness and swelling where the tick bit you. ? Headache. ?   Pain in a muscle, joint, or bone. ? Being more tired than normal. ? Trouble walking or moving your legs. ? Numbness in your legs. ? Tender and swollen lymph glands.  A part of a tick breaks off and gets stuck in your skin. Get help right away if:  You cannot remove a tick.  You cannot move (have paralysis) or feel weak.  You are feeling worse or have new symptoms.  You find a tick that is biting you and filled with blood. This is important if you are in an area where diseases from ticks are common. Summary  Ticks may carry germs that can make you sick.  To prevent tick bites wear long sleeves, long pants, and light colors. Use insect repellent. Follow the instructions on the label.  If the tick is biting, do not try to remove it with heat, alcohol, petroleum  jelly, or fingernail polish.  Use tweezers, curved forceps, or a tick-removal tool to grasp the tick. Gently pull up until the tick lets go. Do not twist or jerk the tick. Do not squeeze or crush the tick.  If you have symptoms, contact a doctor. This information is not intended to replace advice given to you by your health care provider. Make sure you discuss any questions you have with your health care provider. Document Revised: 07/14/2019 Document Reviewed: 07/14/2019 Elsevier Patient Education  2021 Elsevier Inc.  

## 2020-11-01 NOTE — Progress Notes (Signed)
Acute Office Visit  Subjective:    Patient ID: Karen Gross, female    DOB: 31-Mar-1966, 55 y.o.   MRN: [id number]  Chief Complaint  Patient presents with  . Tick Removal    HPI Patient is in today for evaluation of tick bite. She states that she first noted what she thought was ingrown hair yesterday. This was on the back of right thigh in crease of skin where the buttocks meet the leg. She noted it again today in the shower. She had her partner look at it. Both thought it might be a mole. The patient states that her partner pulled on it/squeezed it, and was able to remove a small deer tick from the area. The patient denies itching or irritation of the skin where the tick had latched on. She denies rash, fever, chills, body aches, or headaches. She wanted to have the area checked to make sure there were no problems.   Past Medical History:  Diagnosis Date  . Anxiety   . Bartholin cyst   . GERD (gastroesophageal reflux disease)   . Hip pain   . Hypothyroidism   . Knee pain   . 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 ABDOMINAL HYSTERECTOMY    . KNEE ARTHROSCOPY Right     Family History  Problem Relation Age of Onset  . Breast cancer Mother   . Heart disease Mother   . High blood pressure Mother   . Cancer Mother   . Depression Mother   . Breast cancer Paternal Grandmother   . Colon cancer Neg Hx   . Stomach cancer Neg Hx   . Colon polyps Neg Hx   . Esophageal cancer Neg Hx   . Rectal cancer Neg Hx     Social History   Socioeconomic History  . Marital status: Single    Spouse name: Not on file  . Number of children: Not on file  . Years of education: Not on file  . Highest education level: Not on file  Occupational History  . 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 Smoking status: Never Smoker  . Smokeless tobacco: Never Used  Vaping Use  . Vaping Use: Never used  Substance and Sexual  Activity  . Alcohol use: Yes    Comment: once a month- wine  . Drug use: No  . Sexual activity: Yes    Birth control/protection: None  Other Topics Concern  . Not on file  Social History Narrative  . Not on file   Social Determinants of Health   Financial Resource Strain: Not on file  Food Insecurity: Not on file  Transportation Needs: Not on file  Physical Activity: Not on file  Stress: Not on file  Social Connections: Not on file  Intimate Partner Violence: Not on file    Outpatient Medications Prior to Visit  Medication Sig Dispense Refill  . ARIPiprazole (ABILIFY) 10 MG tablet TAKE 1 TABLET BY MOUTH DAILY. 90 tablet 0  . divalproex (DEPAKOTE) 500 MG DR tablet Take 500 mg by mouth daily.    [name] levothyroxine (SYNTHROID) 100 MCG tablet Take 1 tablet on Mondays and Thursdays only in the morning before breakfast 90 tablet 0  . levothyroxine (SYNTHROID) 112 MCG tablet TAKE 1 TABLET BY MOUTH DAILY BEFORE BREAKFAST. 90 tablet 0  . omeprazole (PRILOSEC) 20 MG capsule Take 20 mg by mouth daily.    [name]  predniSONE (DELTASONE) 10 MG tablet Take 1 tablet (10 mg total) by mouth daily with breakfast. 5 tablet 0  . topiramate (TOPAMAX) 100 MG tablet Take 1 tablet (100 mg total) by mouth 2 (two) times daily. 180 tablet 0  . Vitamin D, Ergocalciferol, (DRISDOL) 1.25 MG (50000 UNIT) CAPS capsule Take 1 capsule (50,000 Units total) by mouth every 7 (seven) days. 8 capsule 0   No facility-administered medications prior to visit.    No Known Allergies  Review of Systems  Constitutional: Negative for activity change, chills and fever.  HENT: Negative for congestion and sinus pain.   Eyes: Negative.   Respiratory: Negative for cough, shortness of breath and wheezing.   Cardiovascular: Negative for chest pain and palpitations.  Gastrointestinal: Negative for constipation, nausea and vomiting.  Endocrine: Negative.   Musculoskeletal: Negative.  Negative for back pain and myalgias.  Skin:  Negative for rash.       Removed tick from the skin of the back of the right upper thigh.   Allergic/Immunologic: Negative for environmental allergies.  Neurological: Negative for dizziness, weakness and headaches.  Hematological: Negative.   Psychiatric/Behavioral: The patient is not nervous/anxious.   All other systems reviewed and are negative.      Objective:    Physical Exam Vitals and nursing note reviewed.  Constitutional:      Appearance: Normal appearance. She is well-developed.  HENT:     Head: Normocephalic.  Eyes:     Pupils: Pupils are equal, round, and reactive to light.  Cardiovascular:     Rate and Rhythm: Normal rate and regular rhythm.     Heart sounds: Normal heart sounds.  Pulmonary:     Effort: Pulmonary effort is normal.     Breath sounds: Normal breath sounds.  Abdominal:     Palpations: Abdomen is soft.  Musculoskeletal:        General: Normal range of motion.     Cervical back: Normal range of motion and neck supple.  Skin:    General: Skin is warm and dry.       Neurological:     General: No focal deficit present.     Mental Status: She is alert and oriented to person, place, and time.  Psychiatric:        Mood and Affect: Mood normal.        Behavior: Behavior normal.        Thought Content: Thought content normal.        Judgment: Judgment normal.    Today's Vitals   11/01/20 1047  BP: 101/67  Pulse: 75  Temp: (!) 97.4 F (36.3 C)  SpO2: 97%  Weight: 180 lb 9.6 oz (81.9 kg)  Height: 5' 6.5" (1.689 m)   Body mass index is 28.71 kg/m.   Wt Readings from Last 3 Encounters:  11/01/20 180 lb 9.6 oz (81.9 kg)  10/28/20 170 lb (77.1 kg)  10/04/20 177 lb 6.4 oz (80.5 kg)    Health Maintenance Due  Topic Date Due  . Hepatitis C Screening  Never done  . COVID-19 Vaccine (1) Never done    There are no preventive care reminders to display for this patient.   Lab Results  Component Value Date   TSH 1.800 10/04/2020   Lab  Results  Component Value Date   WBC 5.3 10/04/2020   HGB 15.3 10/04/2020   HCT 45.8 10/04/2020   MCV 94 10/04/2020   PLT 214 10/04/2020   Lab Results  Component Value  Date   NA 144 10/04/2020   K 4.1 10/04/2020   CO2 20 10/04/2020   GLUCOSE 95 10/04/2020   BUN 18 10/04/2020   CREATININE 0.96 10/04/2020   BILITOT 0.3 10/04/2020   ALKPHOS 79 10/04/2020   AST 17 10/04/2020   ALT 17 10/04/2020   PROT 6.0 10/04/2020   ALBUMIN 4.0 10/04/2020   CALCIUM 9.3 10/04/2020   Lab Results  Component Value Date   CHOL 143 11/05/2019   Lab Results  Component Value Date   HDL 52 11/05/2019   Lab Results  Component Value Date   LDLCALC 80 11/05/2019   Lab Results  Component Value Date   TRIG 49 11/05/2019   Lab Results  Component Value Date   CHOLHDL 2.8 11/05/2019   Lab Results  Component Value Date   HGBA1C 5.1 11/05/2019       Assessment & Plan:  1. Tick bite of right thigh, initial encounter Small tick bit present at posterior, upper right thigh in skin crease where the buttocks meet the leg. No evidence of infection, rash, or other complications. Will have patient monitor for complications.   Problem List Items Addressed This Visit      Musculoskeletal and Integument   Tick bite of right thigh - Primary     Time spent with the patient was approximately 20 minutes. This time included reviewing progress notes, labs, imaging studies, and discussing plan for follow up.    Carlean Jews, NP

## 2020-11-02 ENCOUNTER — Encounter (INDEPENDENT_AMBULATORY_CARE_PROVIDER_SITE_OTHER): Payer: Self-pay

## 2020-11-29 ENCOUNTER — Other Ambulatory Visit: Payer: Self-pay | Admitting: Family Medicine

## 2020-11-29 NOTE — Telephone Encounter (Signed)
Refill request

## 2020-12-16 ENCOUNTER — Telehealth: Payer: Self-pay | Admitting: Physician Assistant

## 2020-12-16 ENCOUNTER — Other Ambulatory Visit: Payer: Self-pay

## 2020-12-16 DIAGNOSIS — E039 Hypothyroidism, unspecified: Secondary | ICD-10-CM

## 2020-12-16 DIAGNOSIS — E038 Other specified hypothyroidism: Secondary | ICD-10-CM

## 2020-12-16 MED ORDER — LEVOTHYROXINE SODIUM 112 MCG PO TABS: ORAL_TABLET | ORAL | 0 refills | Status: DC

## 2020-12-16 NOTE — Telephone Encounter (Signed)
Patient contacted via voicemail explaining medication was sent in and to call back for an appointment.

## 2020-12-16 NOTE — Telephone Encounter (Signed)
Patient would like a refill on her Synthroid 112 mcg tablets and uses [facility]. Please advise, thanks.

## 2020-12-16 NOTE — Telephone Encounter (Signed)
Please contact patient to schedule a follow-up appointment with [facility] for future medication refills. Please let patient know a refill has been sent to [facility] as well.   Faythe Dingwall, CMA

## 2020-12-17 ENCOUNTER — Other Ambulatory Visit: Payer: Self-pay | Admitting: Physician Assistant

## 2020-12-17 DIAGNOSIS — E038 Other specified hypothyroidism: Secondary | ICD-10-CM

## 2020-12-17 DIAGNOSIS — E039 Hypothyroidism, unspecified: Secondary | ICD-10-CM

## 2020-12-17 MED ORDER — LEVOTHYROXINE SODIUM 112 MCG PO TABS: ORAL_TABLET | ORAL | 0 refills | Status: DC

## 2020-12-20 ENCOUNTER — Other Ambulatory Visit (INDEPENDENT_AMBULATORY_CARE_PROVIDER_SITE_OTHER): Payer: Self-pay | Admitting: Adult Health

## 2020-12-20 DIAGNOSIS — E559 Vitamin D deficiency, unspecified: Secondary | ICD-10-CM

## 2021-01-07 ENCOUNTER — Ambulatory Visit: Payer: BC Managed Care – PPO | Admitting: Physician Assistant

## 2021-01-10 ENCOUNTER — Telehealth: Payer: Self-pay | Admitting: Physician Assistant

## 2021-01-10 NOTE — Telephone Encounter (Signed)
Pt stating that she is having slight chest pain for the past several weeks. Pt states this pain is under her Left breast. Pt denies SOB, dizziness, confusion, left arm pain and states her BP at home is normal.  Pt advised to go to ED for evaluation and treatment. Pt declined stating this has been happening for a while and she does not feel it is cardiac related and states she will discuss with provider at upcoming appointment. AS, CMA

## 2021-01-12 ENCOUNTER — Encounter: Payer: Self-pay | Admitting: Physician Assistant

## 2021-01-12 ENCOUNTER — Other Ambulatory Visit: Payer: Self-pay

## 2021-01-12 ENCOUNTER — Ambulatory Visit: Payer: BC Managed Care – PPO | Admitting: Physician Assistant

## 2021-01-12 VITALS — BP 95/65 | HR 64 | Temp 98.0°F | Ht 66.0 in | Wt 171.8 lb

## 2021-01-12 DIAGNOSIS — E038 Other specified hypothyroidism: Secondary | ICD-10-CM | POA: Diagnosis not present

## 2021-01-12 DIAGNOSIS — M940 Chondrocostal junction syndrome [Tietze]: Secondary | ICD-10-CM

## 2021-01-12 DIAGNOSIS — G43809 Other migraine, not intractable, without status migrainosus: Secondary | ICD-10-CM

## 2021-01-12 DIAGNOSIS — E559 Vitamin D deficiency, unspecified: Secondary | ICD-10-CM | POA: Diagnosis not present

## 2021-01-12 DIAGNOSIS — G40909 Epilepsy, unspecified, not intractable, without status epilepticus: Secondary | ICD-10-CM

## 2021-01-12 DIAGNOSIS — Z0000 Encounter for general adult medical examination without abnormal findings: Secondary | ICD-10-CM

## 2021-01-12 MED ORDER — PREDNISONE 20 MG PO TABS: ORAL_TABLET | ORAL | 0 refills | Status: DC

## 2021-01-12 NOTE — Assessment & Plan Note (Signed)
-  Last Vitamin D 32.9, will repeat Vitamin D today. Pending results will send additional refills for Vitamin D 50,000 units or adjust treatment plan.

## 2021-01-12 NOTE — Assessment & Plan Note (Signed)
-  Last TSH wnl -Continue current medication regimen. -Rechecking thyroid labs today. Pending results will make medication adjustments if indicated.  

## 2021-01-12 NOTE — Patient Instructions (Signed)
Vitamin D Deficiency ?Vitamin D deficiency is when your body does not have enough vitamin D. Vitamin D is important to your body for many reasons: ?It helps the body absorb two important minerals--calcium and phosphorus. ?It plays a role in bone health. ?It may help to prevent some diseases, such as diabetes and multiple sclerosis. ?It plays a role in muscle function, including heart function. ?If vitamin D deficiency is severe, it can cause a condition in which your bones become soft. In adults, this condition is called osteomalacia. In children, this condition is called rickets. ?What are the causes? ?This condition may be caused by: ?Not eating enough foods that contain vitamin D. ?Not getting enough natural sun exposure. ?Having certain digestive system diseases that make it difficult for your body to absorb vitamin D. These diseases include Crohn's disease, chronic pancreatitis, and cystic fibrosis. ?Having a surgery in which a part of the stomach or a part of the small intestine is removed. ?Having chronic kidney disease or liver disease. ?What increases the risk? ?You are more likely to develop this condition if you: ?Are older. ?Do not spend much time outdoors. ?Live in a long-term care facility. ?Have had broken bones. ?Have weak or thin bones (osteoporosis). ?Have a disease or condition that changes how the body absorbs vitamin D. ?Have dark skin. ?Take certain medicines, such as steroid medicines or certain seizure medicines. ?Are overweight or obese. ?What are the signs or symptoms? ?In mild cases of vitamin D deficiency, there may not be any symptoms. If the condition is severe, symptoms may include: ?Bone pain. ?Muscle pain. ?Falling often. ?Broken bones caused by a minor injury. ?How is this diagnosed? ?This condition may be diagnosed with blood tests. Imaging tests such as X-rays may also be done to look for changes in the bone. ?How is this treated? ?Treatment for this condition may depend on what  caused the condition. Treatment options include: ?Taking vitamin D supplements. Your health care provider will suggest what dose is best for you. ?Taking a calcium supplement. Your health care provider will suggest what dose is best for you. ?Follow these instructions at home: ?Eating and drinking ? ?Eat foods that contain vitamin D. Choices include: ?Fortified dairy products, cereals, or juices. Fortified means that vitamin D has been added to the food. Check the label on the package to see if the food is fortified. ?Fatty fish, such as salmon or trout. ?Eggs. ?Oysters. ?Mushrooms. ?The items listed above may not be a complete list of recommended foods and beverages. Contact a dietitian for more information. ?General instructions ?Take medicines and supplements only as told by your health care provider. ?Get regular, safe exposure to natural sunlight. ?Do not use a tanning bed. ?Maintain a healthy weight. Lose weight if needed. ?Keep all follow-up visits as told by your health care provider. This is important. ?How is this prevented? ?You can get vitamin D by: ?Eating foods that naturally contain vitamin D. ?Eating or drinking products that have been fortified with vitamin D, such as cereals, juices, and dairy products (including milk). ?Taking a vitamin D supplement or a multivitamin supplement that contains vitamin D. ?Being in the sun. Your body naturally makes vitamin D when your skin is exposed to sunlight. Your body changes the sunlight into a form of the vitamin that it can use. ?Contact a health care provider if: ?Your symptoms do not go away. ?You feel nauseous or you vomit. ?You have fewer bowel movements than usual or are constipated. ?Summary ?Vitamin   D deficiency is when your body does not have enough vitamin D. ?Vitamin D is important to your body for good bone health and muscle function, and it may help prevent some diseases. ?Vitamin D deficiency is primarily treated through supplementation. Your  health care provider will suggest what dose is best for you. ?You can get vitamin D by eating foods that contain vitamin D, by being in the sun, and by taking a vitamin D supplement or a multivitamin supplement that contains vitamin D. ?This information is not intended to replace advice given to you by your health care provider. Make sure you discuss any questions you have with your health care provider. ?Document Revised: 03/25/2018 Document Reviewed: 03/25/2018 ?Elsevier Patient Education ? 2022 [employer] ? ?

## 2021-01-12 NOTE — Progress Notes (Signed)
Established Patient Office Visit  Subjective:  Patient ID: Karen Gross, female    DOB: [birth year]  Age: 55 y.o. MRN: [id number]  CC:  Chief Complaint  Patient presents with   Follow-up   Thyroid Problem   Medication Refill    HPI Karen Gross presents for follow up on hypothyroidism. Patient has c/o left side pain underneath her breast for several weeks. States pain onset occurs when she takes a deep breath and at night when turning over. Denies injury/trauma, strenuous upper body activity, shortness of breath, dizziness or altered mental status. Has not tried anything yet.  Hypothyroidism: Reports medication compliance. States has no concerns about thyroid symptoms.  Vitamin D def: Patient reports has been out of her Vitamin D rx which is managed by [facility].  Migraine: Patient on topiramate for several years. Reports headaches have been well controlled. No recent migraines or recurrent seizures.    Past Medical History:  Diagnosis Date   Anxiety    Bartholin cyst    GERD (gastroesophageal reflux disease)    Hip pain    Hypothyroidism    Knee pain    Seizures (HCC)    Stress Induced- last seizure 5 + years ago     Past Surgical History:  Procedure Laterality Date   ABDOMINAL HYSTERECTOMY     KNEE ARTHROSCOPY Right     Family History  Problem Relation Age of Onset   Breast cancer Mother    Heart disease Mother    High blood pressure Mother    Cancer Mother    Depression Mother    Breast cancer Paternal Grandmother    Colon cancer Neg Hx    Stomach cancer Neg Hx    Colon polyps Neg Hx    Esophageal cancer Neg Hx    Rectal cancer Neg Hx     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  Tobacco Use   Smoking status: Never   Smokeless tobacco: Never  Vaping  Use   Vaping Use: Never used  Substance and Sexual Activity   Alcohol use: Yes    Comment: once a month- wine   Drug use: No   Sexual activity: Yes    Birth control/protection: None  Other Topics Concern   Not on file  Social History Narrative   Not on file   Social Determinants of Health   Financial Resource Strain: Not on file  Food Insecurity: Not on file  Transportation Needs: Not on file  Physical Activity: Not on file  Stress: Not on file  Social Connections: Not on file  Intimate Partner Violence: Not on file    Outpatient Medications Prior to Visit  Medication Sig Dispense Refill   ARIPiprazole (ABILIFY) 10 MG tablet TAKE 1 TABLET BY MOUTH DAILY. 90 tablet 0   divalproex (DEPAKOTE) 500 MG DR tablet Take 500 mg by mouth daily.     levothyroxine (SYNTHROID) 100 MCG tablet Take 1 tablet on Mondays and Thursdays only in the morning before breakfast 90 tablet 0   levothyroxine (SYNTHROID) 112 MCG tablet TAKE 1 TABLET BY MOUTH DAILY BEFORE BREAKFAST. **PLEASE SCHEDULE A FOLLOW UP APPT FOR FUTURE MED REFILLS** 30 tablet 0   omeprazole (PRILOSEC) 20 MG capsule Take 20 mg by  mouth daily.     predniSONE (DELTASONE) 10 MG tablet Take 1 tablet (10 mg total) by mouth daily with breakfast. 5 tablet 0   topiramate (TOPAMAX) 100 MG tablet Take 1 tablet (100 mg total) by mouth 2 (two) times daily. 180 tablet 0   Vitamin D, Ergocalciferol, (DRISDOL) 1.25 MG (50000 UNIT) CAPS capsule Take 1 capsule (50,000 Units total) by mouth every 7 (seven) days. 8 capsule 0   No facility-administered medications prior to visit.    No Known Allergies  ROS Review of Systems Review of Systems:  A fourteen system review of systems was performed and found to be positive as per HPI.   Objective:    Physical Exam General:  Well Developed, well nourished, appropriate for stated age.  Neuro:  Alert and oriented,  extra-ocular muscles intact  HEENT:  Normocephalic, atraumatic, neck supple Skin:  no  gross rash, warm, pink. Cardiac:  RRR, S1 S2, no murmur Respiratory:  ECTA B/L w/o wheezing, crackles or rales, Not using accessory muscles, speaking in full sentences- unlabored. MSK: No tenderness to palpation of chest wall area or ribcage, no deformity noted. Good ROM with some discomfort in left costal area with side-bending to right side and rotation to the left Vascular:  Ext warm, no cyanosis apprec.; cap RF less 2 sec. Psych:  No HI/SI, judgement and insight good, Euthymic mood. Full Affect.  BP 95/65   Pulse 64   Temp 98 F (36.7 C)   Ht 5' 6"  (1.676 m)   Wt 171 lb 12.8 oz (77.9 kg)   SpO2 99%   BMI 27.73 kg/m  Wt Readings from Last 3 Encounters:  01/12/21 171 lb 12.8 oz (77.9 kg)  11/01/20 180 lb 9.6 oz (81.9 kg)  10/28/20 170 lb (77.1 kg)     Health Maintenance Due  Topic Date Due   Hepatitis C Screening  Never done   Zoster Vaccines- Shingrix (1 of 2) Never done   COVID-19 Vaccine (4 - Booster for Pfizer series) 11/17/2020   TETANUS/TDAP  12/18/2020    There are no preventive care reminders to display for this patient.  Lab Results  Component Value Date   TSH 1.800 10/04/2020   Lab Results  Component Value Date   WBC 5.3 10/04/2020   HGB 15.3 10/04/2020   HCT 45.8 10/04/2020   MCV 94 10/04/2020   PLT 214 10/04/2020   Lab Results  Component Value Date   NA 144 10/04/2020   K 4.1 10/04/2020   CO2 20 10/04/2020   GLUCOSE 95 10/04/2020   BUN 18 10/04/2020   CREATININE 0.96 10/04/2020   BILITOT 0.3 10/04/2020   ALKPHOS 79 10/04/2020   AST 17 10/04/2020   ALT 17 10/04/2020   PROT 6.0 10/04/2020   ALBUMIN 4.0 10/04/2020   CALCIUM 9.3 10/04/2020   EGFR 71 10/04/2020   Lab Results  Component Value Date   CHOL 143 11/05/2019   Lab Results  Component Value Date   HDL 52 11/05/2019   Lab Results  Component Value Date   LDLCALC 80 11/05/2019   Lab Results  Component Value Date   TRIG 49 11/05/2019   Lab Results  Component Value Date    CHOLHDL 2.8 11/05/2019   Lab Results  Component Value Date   HGBA1C 5.1 11/05/2019      Assessment & Plan:   Problem List Items Addressed This Visit       Cardiovascular and Mediastinum   Migraine (Chronic)    -Stable. -  Continue current medication regimen. -Will continue to monitor.         Endocrine   Hypothyroidism - Primary    -Last TSH wnl -Continue current medication regimen. -Rechecking thyroid labs today. Pending results will make medication adjustments if indicated.        Relevant Orders   TSH   T4, free   T3     Nervous and Auditory   Seizure disorder ([facility])    -Stable. -On Depakote.         Other   Healthcare maintenance   Relevant Orders   Comp Met (CMET)   TSH   T4, free   T3   CBC w/Diff   Lipid Profile   HgB A1c   Vitamin D (25 hydroxy)   Vitamin D deficiency    -Last Vitamin D 32.9, will repeat Vitamin D today. Pending results will send additional refills for Vitamin D 50,000 units or adjust treatment plan.        Relevant Orders   Vitamin D (25 hydroxy)   Other Visit Diagnoses     Costochondritis       Relevant Medications   predniSONE (DELTASONE) 20 MG tablet      Costochondritis: -Patient has signs and symptoms suggestive of mild costochondritis. Discussed management options and will start short course of corticosteroid therapy. -Recommend to apply heat/ice therapy and gentle stretches.  -If symptoms fail to improve or worsen recommend further evaluation with imaging studies or trial of muscle relaxer.   Meds ordered this encounter  Medications   predniSONE (DELTASONE) 20 MG tablet    Sig: Take 2 tablets PO x 2 days, then 1 tablet x 2 days, then 0.5 tablet x 2 days    Dispense:  7 tablet    Refill:  0    Order Specific Question:   Supervising Provider    Answer:   Beatrice Lecher D [[id number]]    Follow-up: Return in about 3 months (around 04/14/2021) for CPE .   Note:  This note was prepared with assistance of  Dragon voice recognition software. Occasional wrong-word or sound-a-like substitutions may have occurred due to the inherent limitations of voice recognition software.   Lorrene Reid, PA-C

## 2021-01-12 NOTE — Assessment & Plan Note (Signed)
-  Stable. -Continue current medication regimen.  -Will continue to monitor. 

## 2021-01-12 NOTE — Assessment & Plan Note (Signed)
-  Stable. -On Depakote.

## 2021-01-13 LAB — COMPREHENSIVE METABOLIC PANEL
ALT: 17 IU/L (ref 0–32)
AST: 15 IU/L (ref 0–40)
Albumin/Globulin Ratio: 2.2 (ref 1.2–2.2)
Albumin: 4.4 g/dL (ref 3.8–4.9)
Alkaline Phosphatase: 83 IU/L (ref 44–121)
BUN/Creatinine Ratio: 15 (ref 9–23)
BUN: 13 mg/dL (ref 6–24)
Bilirubin Total: 0.5 mg/dL (ref 0.0–1.2)
CO2: 21 mmol/L (ref 20–29)
Calcium: 9.2 mg/dL (ref 8.7–10.2)
Chloride: 107 mmol/L — ABNORMAL HIGH (ref 96–106)
Creatinine, Ser: 0.86 mg/dL (ref 0.57–1.00)
Globulin, Total: 2 g/dL (ref 1.5–4.5)
Glucose: 95 mg/dL (ref 65–99)
Potassium: 4.2 mmol/L (ref 3.5–5.2)
Sodium: 142 mmol/L (ref 134–144)
Total Protein: 6.4 g/dL (ref 6.0–8.5)
eGFR: 81 mL/min/{1.73_m2} (ref >59)

## 2021-01-13 LAB — VITAMIN D 25 HYDROXY (VIT D DEFICIENCY, FRACTURES): Vit D, 25-Hydroxy: 52.1 ng/mL (ref 30.0–100.0)

## 2021-01-13 LAB — T4, FREE: Free T4: 2.12 ng/dL — ABNORMAL HIGH (ref 0.82–1.77)

## 2021-01-13 LAB — CBC WITH DIFFERENTIAL/PLATELET
Basophils Absolute: 0 10*3/uL (ref 0.0–0.2)
Basos: 1 %
EOS (ABSOLUTE): 0.1 10*3/uL (ref 0.0–0.4)
Eos: 1 %
Hematocrit: 45.6 % (ref 34.0–46.6)
Hemoglobin: 15.1 g/dL (ref 11.1–15.9)
Immature Grans (Abs): 0 10*3/uL (ref 0.0–0.1)
Immature Granulocytes: 0 %
Lymphocytes Absolute: 1.4 10*3/uL (ref 0.7–3.1)
Lymphs: 32 %
MCH: 31 pg (ref 26.6–33.0)
MCHC: 33.1 g/dL (ref 31.5–35.7)
MCV: 94 fL (ref 79–97)
Monocytes Absolute: 0.3 10*3/uL (ref 0.1–0.9)
Monocytes: 7 %
Neutrophils Absolute: 2.6 10*3/uL (ref 1.4–7.0)
Neutrophils: 59 %
Platelets: 203 10*3/uL (ref 150–450)
RBC: 4.87 x10E6/uL (ref 3.77–5.28)
RDW: 12.2 % (ref 11.7–15.4)
WBC: 4.4 10*3/uL (ref 3.4–10.8)

## 2021-01-13 LAB — LIPID PANEL
Chol/HDL Ratio: 2.9 ratio (ref 0.0–4.4)
Cholesterol, Total: 183 mg/dL (ref 100–199)
HDL: 64 mg/dL (ref >39)
LDL Chol Calc (NIH): 104 mg/dL — ABNORMAL HIGH (ref 0–99)
Triglycerides: 82 mg/dL (ref 0–149)
VLDL Cholesterol Cal: 15 mg/dL (ref 5–40)

## 2021-01-13 LAB — TSH: TSH: 2.06 u[IU]/mL (ref 0.450–4.500)

## 2021-01-13 LAB — HEMOGLOBIN A1C
Est. average glucose Bld gHb Est-mCnc: 97 mg/dL
Hgb A1c MFr Bld: 5 % (ref 4.8–5.6)

## 2021-01-13 LAB — T3: T3, Total: 84 ng/dL (ref 71–180)

## 2021-01-20 ENCOUNTER — Encounter (INDEPENDENT_AMBULATORY_CARE_PROVIDER_SITE_OTHER): Payer: Self-pay | Admitting: Adult Health

## 2021-01-20 ENCOUNTER — Other Ambulatory Visit: Payer: Self-pay

## 2021-01-20 ENCOUNTER — Ambulatory Visit (INDEPENDENT_AMBULATORY_CARE_PROVIDER_SITE_OTHER): Payer: BC Managed Care – PPO | Admitting: Adult Health

## 2021-01-20 VITALS — BP 109/71 | HR 64 | Temp 98.0°F | Ht 66.0 in | Wt 165.0 lb

## 2021-01-20 DIAGNOSIS — Z6835 Body mass index (BMI) 35.0-35.9, adult: Secondary | ICD-10-CM | POA: Diagnosis not present

## 2021-01-20 DIAGNOSIS — E7800 Pure hypercholesterolemia, unspecified: Secondary | ICD-10-CM | POA: Diagnosis not present

## 2021-01-20 DIAGNOSIS — E559 Vitamin D deficiency, unspecified: Secondary | ICD-10-CM

## 2021-01-20 NOTE — Progress Notes (Signed)
Chief Complaint:   OBESITY Karen Gross is here to discuss her progress with her obesity treatment plan along with follow-up of her obesity related diagnoses. Karen Gross is on the Category 2 Plan and states she is following her eating plan approximately 70-80% of the time. Karen Gross states she is not currently exercising.  Today's visit was #: 13 Starting weight: 217 lbs Starting date: 10/01/2019 Today's weight: 165 lbs Today's date: 01/20/2021 Total lbs lost to date: 52 Total lbs lost since last in-office visit: 5  Interim History: Karen Gross continues to do AMAZING! She is down another 5 lbs for a total of 52 lbs! She followed the tenants of category 2 and leads an active life. She recently had labs with PCP- reviewed with pt today. She is in maintenance phase of this program.    Subjective:   1. Pure hypercholesterolemia Discussed labs with patient today. 01/12/2021 lipid panel revealed total, triglycerides, and HDL- all excellent.  Karen Gross has only slightly elevation of LDL at 104. She is not on statin therapy, does not smoke or vape.  Lab Results  Component Value Date   ALT 17 01/12/2021   AST 15 01/12/2021   ALKPHOS 83 01/12/2021   BILITOT 0.5 01/12/2021   Lab Results  Component Value Date   CHOL 183 01/12/2021   HDL 64 01/12/2021   LDLCALC 104 (H) 01/12/2021   TRIG 82 01/12/2021   CHOLHDL 2.9 01/12/2021   2. Vitamin D deficiency Discussed labs with patient today. Karen Gross's Vitamin D level was 52.1 on 01/12/2021- at goal, not over-replaced.  She was off prescription vitamin D 50,000 IU each week for 2 weeks at the time of lab draw. She denies nausea, vomiting or muscle weakness.  Assessment/Plan:   1. Pure hypercholesterolemia Cardiovascular risk and specific lipid/LDL goals reviewed.  We discussed several lifestyle modifications today and Karen Gross will continue to work on diet, exercise and weight loss efforts. Orders and follow up as documented in patient record.  Reduce  red meat intake and continue active lifestyle.  Counseling Intensive lifestyle modifications are the first line treatment for this issue. Dietary changes: Increase soluble fiber. Decrease simple carbohydrates. Exercise changes: Moderate to vigorous-intensity aerobic activity 150 minutes per week if tolerated. Lipid-lowering medications: see documented in medical record.  2. Vitamin D deficiency Low Vitamin D level contributes to fatigue and are associated with obesity, breast, and colon cancer. She agrees to start to take OTC Vitamin D @5 ,000 IU QD and will follow-up for routine testing of Vitamin D, at least 2-3 times per year to avoid over-replacement.  3. Current BMI 26.7  Karen Gross is currently in the action stage of change. As such, her goal is to continue with weight loss efforts. She has agreed to the Category 2 Plan.   Handout: Recipes II Guide  Exercise goals:  As is  Behavioral modification strategies: increasing lean protein intake, decreasing simple carbohydrates, meal planning and cooking strategies, keeping healthy foods in the home, and planning for success.  Karen Gross has agreed to follow-up with our clinic in 5 months. She was informed of the importance of frequent follow-up visits to maximize her success with intensive lifestyle modifications for her multiple health conditions.   Objective:   Blood pressure 109/71, pulse 64, temperature 98 F (36.7 C), height 5\' 6"  (1.676 m), weight 165 lb (74.8 kg), SpO2 98 %. Body mass index is 26.63 kg/m.  General: Cooperative, alert, well developed, in no acute distress. HEENT: Conjunctivae and lids unremarkable. Cardiovascular: Regular rhythm.  Lungs: Normal work of breathing. Neurologic: No focal deficits.   Lab Results  Component Value Date   CREATININE 0.86 01/12/2021   BUN 13 01/12/2021   NA 142 01/12/2021   K 4.2 01/12/2021   CL 107 (H) 01/12/2021   CO2 21 01/12/2021   Lab Results  Component Value Date   ALT 17  01/12/2021   AST 15 01/12/2021   ALKPHOS 83 01/12/2021   BILITOT 0.5 01/12/2021   Lab Results  Component Value Date   HGBA1C 5.0 01/12/2021   HGBA1C 5.1 11/05/2019   HGBA1C 5.3 03/11/2019   HGBA1C 5.3 09/27/2017   HGBA1C 5.2 11/12/2014   Lab Results  Component Value Date   INSULIN 6.5 10/01/2019   Lab Results  Component Value Date   TSH 2.060 01/12/2021   Lab Results  Component Value Date   CHOL 183 01/12/2021   HDL 64 01/12/2021   LDLCALC 104 (H) 01/12/2021   TRIG 82 01/12/2021   CHOLHDL 2.9 01/12/2021   Lab Results  Component Value Date   WBC 4.4 01/12/2021   HGB 15.1 01/12/2021   HCT 45.6 01/12/2021   MCV 94 01/12/2021   PLT 203 01/12/2021   Lab Results  Component Value Date   FERRITIN 24 10/04/2020     Attestation Statements:   Reviewed by clinician on day of visit: allergies, medications, problem list, medical history, surgical history, family history, social history, and previous encounter notes.  Time spent on visit including pre-visit chart review and post-visit care and charting was 32 minutes.   Edmund Hilda, CMA, am acting as transcriptionist for William Hamburger, NP.  I have reviewed the above documentation for accuracy and completeness, and I agree with the above. -  Chasyn Cinque d. Aiysha Jillson, NP-C

## 2021-01-26 ENCOUNTER — Ambulatory Visit (INDEPENDENT_AMBULATORY_CARE_PROVIDER_SITE_OTHER): Payer: BC Managed Care – PPO

## 2021-01-26 ENCOUNTER — Other Ambulatory Visit: Payer: Self-pay

## 2021-01-26 ENCOUNTER — Ambulatory Visit
Admission: RE | Admit: 2021-01-26 | Discharge: 2021-01-26 | Disposition: A | Discharge status: Home / Self Care | Payer: BC Managed Care – PPO | Source: Ambulatory Visit | Attending: Emergency Medicine | Admitting: Emergency Medicine

## 2021-01-26 VITALS — BP 109/68 | HR 65 | Temp 98.6°F | Resp 18

## 2021-01-26 DIAGNOSIS — R071 Chest pain on breathing: Secondary | ICD-10-CM | POA: Diagnosis not present

## 2021-01-26 DIAGNOSIS — S299XXA Unspecified injury of thorax, initial encounter: Secondary | ICD-10-CM | POA: Diagnosis not present

## 2021-01-26 MED ORDER — IBUPROFEN 800 MG PO TABS: 800.0000 mg | ORAL_TABLET | Freq: Three times a day (TID) | ORAL | 0 refills | Status: DC

## 2021-01-26 MED ORDER — TRAMADOL HCL 50 MG PO TABS: 50.0000 mg | ORAL_TABLET | Freq: Four times a day (QID) | ORAL | 0 refills | Status: DC | PRN

## 2021-01-26 NOTE — ED Triage Notes (Signed)
Pt states her son picked her up on Monday evening and feels like it cracked a rib. C/o pain on inspiration. Denies SOB.

## 2021-01-26 NOTE — Discharge Instructions (Addendum)
Use anti-inflammatories for pain/swelling. You may take up to 800 mg Ibuprofen every 8 hours with food. You may supplement Ibuprofen with Tylenol [phone] mg every 8 hours.  Tramadol for severe pain-use sparingly, will cause drowsiness, do not drive or work after taking Alternate ice and heat to area Please follow-up if any symptoms not improving or worsening
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
## 2021-03-21 ENCOUNTER — Other Ambulatory Visit: Payer: Self-pay | Admitting: Physician Assistant

## 2021-03-21 DIAGNOSIS — E038 Other specified hypothyroidism: Secondary | ICD-10-CM

## 2021-03-21 DIAGNOSIS — E039 Hypothyroidism, unspecified: Secondary | ICD-10-CM

## 2021-04-14 ENCOUNTER — Encounter: Payer: BC Managed Care – PPO | Admitting: Physician Assistant

## 2021-04-16 ENCOUNTER — Other Ambulatory Visit: Payer: Self-pay | Admitting: Physician Assistant

## 2021-04-20 ENCOUNTER — Ambulatory Visit (INDEPENDENT_AMBULATORY_CARE_PROVIDER_SITE_OTHER): Payer: BC Managed Care – PPO | Admitting: Physician Assistant

## 2021-04-20 ENCOUNTER — Encounter: Payer: Self-pay | Admitting: Physician Assistant

## 2021-04-20 ENCOUNTER — Other Ambulatory Visit: Payer: Self-pay

## 2021-04-20 VITALS — BP 99/68 | HR 66 | Temp 98.2°F | Ht 66.5 in | Wt 177.6 lb

## 2021-04-20 DIAGNOSIS — H6122 Impacted cerumen, left ear: Secondary | ICD-10-CM

## 2021-04-20 DIAGNOSIS — Z0000 Encounter for general adult medical examination without abnormal findings: Secondary | ICD-10-CM | POA: Diagnosis not present

## 2021-04-20 DIAGNOSIS — Z23 Encounter for immunization: Secondary | ICD-10-CM | POA: Diagnosis not present

## 2021-04-20 NOTE — Patient Instructions (Signed)
Preventive Care 40-53 Years Old, Female Preventive care refers to lifestyle choices and visits with your health care provider that can promote health and wellness. This includes: A yearly physical exam. This is also called an annual wellness visit. Regular dental and eye exams. Immunizations. Screening for certain conditions. Healthy lifestyle choices, such as: Eating a healthy diet. Getting regular exercise. Not using drugs or products that contain nicotine and tobacco. Limiting alcohol use. What can I expect for my preventive care visit? Physical exam Your health care provider will check your: Height and weight. These may be used to calculate your BMI (body mass index). BMI is a measurement that tells if you are at a healthy weight. Heart rate and blood pressure. Body temperature. Skin for abnormal spots. Counseling Your health care provider may ask you questions about your: Past medical problems. Family's medical history. Alcohol, tobacco, and drug use. Emotional well-being. Home life and relationship well-being. Sexual activity. Diet, exercise, and sleep habits. Work and work environment. Access to firearms. Method of birth control. Menstrual cycle. Pregnancy history. What immunizations do I need? Vaccines are usually given at various ages, according to a schedule. Your health care provider will recommend vaccines for you based on your age, medical history, and lifestyle or other factors, such as travel or where you work. What tests do I need? Blood tests Lipid and cholesterol levels. These may be checked every 5 years, or more often if you are over 50 years old. Hepatitis C test. Hepatitis B test. Screening Lung cancer screening. You may have this screening every year starting at age 55 if you have a 30-pack-year history of smoking and currently smoke or have quit within the past 15 years. Colorectal cancer screening. All adults should have this screening starting at  age 50 and continuing until age 75. Your health care provider may recommend screening at age 45 if you are at increased risk. You will have tests every 1-10 years, depending on your results and the type of screening test. Diabetes screening. This is done by checking your blood sugar (glucose) after you have not eaten for a while (fasting). You may have this done every 1-3 years. Mammogram. This may be done every 1-2 years. Talk with your health care provider about when you should start having regular mammograms. This may depend on whether you have a family history of breast cancer. BRCA-related cancer screening. This may be done if you have a family history of breast, ovarian, tubal, or peritoneal cancers. Pelvic exam and Pap test. This may be done every 3 years starting at age 21. Starting at age 30, this may be done every 5 years if you have a Pap test in combination with an HPV test. Other tests STD (sexually transmitted disease) testing, if you are at risk. Bone density scan. This is done to screen for osteoporosis. You may have this scan if you are at high risk for osteoporosis. Talk with your health care provider about your test results, treatment options, and if necessary, the need for more tests. Follow these instructions at home: Eating and drinking  Eat a diet that includes fresh fruits and vegetables, whole grains, lean protein, and low-fat dairy products. Take vitamin and mineral supplements as recommended by your health care provider. Do not drink alcohol if: Your health care provider tells you not to drink. You are pregnant, may be pregnant, or are planning to become pregnant. If you drink alcohol: Limit how much you have to 0-1 drink a day. Be   aware of how much alcohol is in your drink. In the [place] one drink equals one 12 oz bottle of beer (355 mL), one 5 oz glass of wine (148 mL), or one 1 oz glass of hard liquor (44 mL). Lifestyle Take daily care of your teeth and  gums. Brush your teeth every morning and night with fluoride toothpaste. Floss one time each day. Stay active. Exercise for at least 30 minutes 5 or more days each week. Do not use any products that contain nicotine or tobacco, such as cigarettes, e-cigarettes, and chewing tobacco. If you need help quitting, ask your health care provider. Do not use drugs. If you are sexually active, practice safe sex. Use a condom or other form of protection to prevent STIs (sexually transmitted infections). If you do not wish to become pregnant, use a form of birth control. If you plan to become pregnant, see your health care provider for a prepregnancy visit. If told by your health care provider, take low-dose aspirin daily starting at age 63. Find healthy ways to cope with stress, such as: Meditation, yoga, or listening to music. Journaling. Talking to a trusted person. Spending time with friends and family. Safety Always wear your seat belt while driving or riding in a vehicle. Do not drive: If you have been drinking alcohol. Do not ride with someone who has been drinking. When you are tired or distracted. While texting. Wear a helmet and other protective equipment during sports activities. If you have firearms in your house, make sure you follow all gun safety procedures. What's next? Visit your health care provider once a year for an annual wellness visit. Ask your health care provider how often you should have your eyes and teeth checked. Stay up to date on all vaccines. This information is not intended to replace advice given to you by your health care provider. Make sure you discuss any questions you have with your health care provider. Document Revised: 09/24/2020 Document Reviewed: 03/28/2018 Elsevier Patient Education  2022 [employer].

## 2021-04-21 NOTE — Progress Notes (Signed)
Subjective:     Karen Gross is a 55 y.o. female and is here for a comprehensive physical exam. The patient reports no problems.  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  Tobacco Use   Smoking status: Never   Smokeless tobacco: Never  Vaping Use   Vaping Use: Never used  Substance and Sexual Activity   Alcohol use: Yes    Comment: once a month- wine   Drug use: No   Sexual activity: Yes    Birth control/protection: None  Other Topics Concern   Not on file  Social History Narrative   Not on file   Social Determinants of Health   Financial Resource Strain: Not on file  Food Insecurity: Not on file  Transportation Needs: Not on file  Physical Activity: Not on file  Stress: Not on file  Social Connections: Not on file  Intimate Partner Violence: Not on file   Health Maintenance  Topic Date Due   Hepatitis C Screening  Never done   Zoster Vaccines- Shingrix (1 of 2) Never done   COVID-19 Vaccine (4 - Booster for Pfizer series) 11/17/2020   MAMMOGRAM  09/10/2022   COLONOSCOPY (Pts 45-8yrs Insurance coverage will need to be confirmed)  12/07/2029   TETANUS/TDAP  04/21/2031   INFLUENZA VACCINE  Completed   HIV Screening  Completed   HPV VACCINES  Aged Out   PAP SMEAR-Modifier  Discontinued    The following portions of the patient's history were reviewed and updated as appropriate: allergies, current medications, past family history, past medical history, past social history, past surgical history, and problem list.  Review of Systems Pertinent items noted in HPI and remainder of comprehensive ROS otherwise negative.   Objective:    BP 99/68   Pulse 66   Temp 98.2 F (36.8 C)   Ht 5' 6.5" (1.689 m)   Wt 177 lb 9.6 oz (80.6 kg)   SpO2 100%   BMI 28.24 kg/m   General appearance: alert, cooperative, and no distress Head: Normocephalic, without obvious abnormality, atraumatic Eyes: conjunctivae/corneas clear. PERRL, EOM's intact. Fundi benign. Ears: abnormal external canal right ear - normal TM, mild cerumen and abnormal external canal left ear - canal shows mild erythema after removal of wax and ceruminosis impacting canal Nose: Nares normal. Septum midline. Mucosa normal. No drainage or sinus tenderness. Throat: lips, mucosa, and tongue normal; teeth and gums normal Neck: no adenopathy, no carotid bruit, no JVD, supple, symmetrical, trachea midline, and thyroid: normal to inspection Back: symmetric, no curvature. ROM normal. No CVA tenderness. Lungs: clear to auscultation bilaterally Heart: regular rate and rhythm, S1, S2 normal, no murmur, click, rub or gallop Abdomen: soft, non-tender; bowel sounds normal; no masses,  no organomegaly Extremities: extremities normal, atraumatic, no cyanosis or edema Pulses: 2+ and symmetric Skin: Skin color, texture, turgor normal. No rashes or lesions Lymph nodes: Cervical adenopathy: normal and Supraclavicular adenopathy: normal Neurologic: Grossly normal    Assessment:    Healthy female exam.     Plan:  -UTD colonoscopy, mammogram, s/p hysterectomy. -Agreeable to influenza and Tdap. Pt deferred Shingrix. -Advised to schedule lab visit for fasting labs (lipid panel, cmp, thyroid panel) in 1-4 weeks. -Discussed resuming weight loss efforts with diet changes. Followed  by [facility]. -Patient tolerated left ear irrigation without complications.  -Follow up in 6 months for HLD, hypothyroid   See After Visit Summary for Counseling Recommendations

## 2021-05-25 ENCOUNTER — Telehealth: Payer: Self-pay | Admitting: Physician Assistant

## 2021-05-25 DIAGNOSIS — Z20828 Contact with and (suspected) exposure to other viral communicable diseases: Secondary | ICD-10-CM

## 2021-05-25 NOTE — Telephone Encounter (Signed)
Patients daughter has tested positive for the flu and she is requesting a rx of Tamiflu sent to [facility]. AS, CMA

## 2021-05-26 ENCOUNTER — Other Ambulatory Visit: Payer: BC Managed Care – PPO

## 2021-05-26 MED ORDER — OSELTAMIVIR PHOSPHATE 75 MG PO CAPS: 75.0000 mg | ORAL_CAPSULE | Freq: Every day | ORAL | 0 refills | Status: DC

## 2021-05-26 NOTE — Addendum Note (Signed)
Addended by: Mayer Masker on: 05/26/2021 08:58 AM   Modules accepted: Orders

## 2021-05-26 NOTE — Telephone Encounter (Signed)
Left msg to advise patient med sent to pharmacy. AS, CMA

## 2021-05-30 ENCOUNTER — Other Ambulatory Visit: Payer: Self-pay | Admitting: Physician Assistant

## 2021-06-20 ENCOUNTER — Other Ambulatory Visit: Payer: Self-pay | Admitting: Physician Assistant

## 2021-06-20 DIAGNOSIS — E039 Hypothyroidism, unspecified: Secondary | ICD-10-CM

## 2021-06-20 DIAGNOSIS — E038 Other specified hypothyroidism: Secondary | ICD-10-CM

## 2021-06-27 ENCOUNTER — Ambulatory Visit (INDEPENDENT_AMBULATORY_CARE_PROVIDER_SITE_OTHER): Payer: BC Managed Care – PPO | Admitting: Adult Health

## 2021-06-27 ENCOUNTER — Encounter (INDEPENDENT_AMBULATORY_CARE_PROVIDER_SITE_OTHER): Payer: Self-pay | Admitting: Adult Health

## 2021-06-27 ENCOUNTER — Other Ambulatory Visit: Payer: Self-pay

## 2021-06-27 VITALS — BP 94/57 | HR 77 | Temp 98.1°F | Ht 66.0 in | Wt 167.0 lb

## 2021-06-27 DIAGNOSIS — Z6835 Body mass index (BMI) 35.0-35.9, adult: Secondary | ICD-10-CM

## 2021-06-27 DIAGNOSIS — E559 Vitamin D deficiency, unspecified: Secondary | ICD-10-CM

## 2021-06-27 DIAGNOSIS — E039 Hypothyroidism, unspecified: Secondary | ICD-10-CM

## 2021-06-27 DIAGNOSIS — Z0000 Encounter for general adult medical examination without abnormal findings: Secondary | ICD-10-CM | POA: Diagnosis not present

## 2021-06-27 DIAGNOSIS — E66812 Obesity, class 2: Secondary | ICD-10-CM

## 2021-06-27 NOTE — Progress Notes (Signed)
Chief Complaint:   OBESITY Karen Gross is here to discuss her progress with her obesity treatment plan along with follow-up of her obesity related diagnoses. Karen Gross is on the Category 2 Plan and states she is following her eating plan approximately 50% of the time. Karen Gross states she is walking for 45 minutes 5 times per week.  Today's visit was #: 14 Starting weight: 217 lbs Starting date: 10/01/2019 Today's weight: 167 lbs Today's date: 06/27/2021 Total lbs lost to date: 50 lbs Total lbs lost since last in-office visit: 0  Interim History: Karen Gross is in the maintenance phase - follows up every 5 months.   She had an annual physical with her PCP on 04/20/2021 - no change to medications - weight at PCP appointment 177 pounds/BMI 28.24. She re-focused on following Cat 2 consistently after the Sept PCP appt- today's weight 167 lbs/BMI 27.0 Following Life Stressors: Mom (dementia)/Father (respiratory disorder, home O2 100% of the time) - declining health - traveling back and forth to [place]. Travels to [place] to see her son play baseball on weekends- he has Restaurant manager, fast food- hopes to be drafted into [employer]. Daughter in HS- active in sports. Her [place] position at [employer]  Subjective:   1. Healthcare maintenance 9/22 PCP Appt: -UTD colonoscopy, mammogram, s/p hysterectomy. -Agreeable to influenza and Tdap. Pt deferred Shingrix. -Advised to schedule lab visit for fasting labs (lipid panel, cmp, thyroid panel) in 1-4 weeks. -Discussed resuming weight loss efforts with diet changes. Followed by [facility]. -Patient tolerated left ear irrigation without complications.  -Follow up in 6 months for HLD, hypothyroid  2. Hypothyroidism, unspecified type Managed by PCP - alternating dose schedule of levothyroxine. On 01/12/2021, TSH was 2.060 - therapeutic.  3. Vitamin D deficiency On 01/12/2021, vitamin D level - 52.1 - therapeutic at once weekly ergocalciferol - now  managed by Venia Minks, NP-C.  Assessment/Plan:   1. Healthcare maintenance Follow-up in 16 weeks - sooner for better accountability.  2. Hypothyroidism, unspecified type Continue the levothyroxine per PCP.  3. Vitamin D deficiency Follow-up with psychiatrist as directed.  4. Current BMI 27.0  Karen Gross is currently in the action stage of change. As such, her goal is to continue with weight loss efforts. She has agreed to the Category 2 Plan Monday through Friday and keeping a food journal and adhering to recommended goals of 1200 calories and 85 grams of protein on the weekend.   Exercise goals:  As is.  Behavioral modification strategies: increasing lean protein intake, decreasing simple carbohydrates, increasing water intake, meal planning and cooking strategies, keeping healthy foods in the home, and avoiding temptations.  Karen Gross has agreed to follow-up with our clinic in 16 weeks. She was informed of the importance of frequent follow-up visits to maximize her success with intensive lifestyle modifications for her multiple health conditions.   Objective:   Blood pressure (!) 94/57, pulse 77, temperature 98.1 F (36.7 C), height 5\' 6"  (1.676 m), weight 167 lb (75.8 kg), SpO2 (!) 77 %. Body mass index is 26.95 kg/m.  General: Cooperative, alert, well developed, in no acute distress. HEENT: Conjunctivae and lids unremarkable. Cardiovascular: Regular rhythm.  Lungs: Normal work of breathing. Neurologic: No focal deficits.   Lab Results  Component Value Date   CREATININE 0.86 01/12/2021   BUN 13 01/12/2021   NA 142 01/12/2021   K 4.2 01/12/2021   CL 107 (H) 01/12/2021   CO2 21 01/12/2021   Lab Results  Component Value Date  ALT 17 01/12/2021   AST 15 01/12/2021   ALKPHOS 83 01/12/2021   BILITOT 0.5 01/12/2021   Lab Results  Component Value Date   HGBA1C 5.0 01/12/2021   HGBA1C 5.1 11/05/2019   HGBA1C 5.3 03/11/2019   HGBA1C 5.3 09/27/2017   HGBA1C 5.2  11/12/2014   Lab Results  Component Value Date   INSULIN 6.5 10/01/2019   Lab Results  Component Value Date   TSH 2.060 01/12/2021   Lab Results  Component Value Date   CHOL 183 01/12/2021   HDL 64 01/12/2021   LDLCALC 104 (H) 01/12/2021   TRIG 82 01/12/2021   CHOLHDL 2.9 01/12/2021   Lab Results  Component Value Date   VD25OH 52.1 01/12/2021   VD25OH 32.9 10/28/2020   VD25OH 64.9 07/01/2020   Lab Results  Component Value Date   WBC 4.4 01/12/2021   HGB 15.1 01/12/2021   HCT 45.6 01/12/2021   MCV 94 01/12/2021   PLT 203 01/12/2021   Lab Results  Component Value Date   FERRITIN 24 10/04/2020   Attestation Statements:   Reviewed by clinician on day of visit: allergies, medications, problem list, medical history, surgical history, family history, social history, and previous encounter notes.  Time spent on visit including pre-visit chart review and post-visit care and charting was 35 minutes.   I, Insurance claims handler, CMA, am acting as Energy manager for William Hamburger, NP.  I have reviewed the above documentation for accuracy and completeness, and I agree with the above. - Fredi Geiler d. Korey Arroyo, NP-C

## 2021-08-21 ENCOUNTER — Encounter (HOSPITAL_BASED_OUTPATIENT_CLINIC_OR_DEPARTMENT_OTHER): Payer: Self-pay | Admitting: *Deleted

## 2021-08-21 ENCOUNTER — Emergency Department (HOSPITAL_BASED_OUTPATIENT_CLINIC_OR_DEPARTMENT_OTHER): Payer: BC Managed Care – PPO

## 2021-08-21 ENCOUNTER — Emergency Department (HOSPITAL_BASED_OUTPATIENT_CLINIC_OR_DEPARTMENT_OTHER)
Admission: EM | Admit: 2021-08-21 | Discharge: 2021-08-21 | Disposition: A | Discharge status: Home / Self Care | Payer: BC Managed Care – PPO | Attending: Emergency Medicine | Admitting: Emergency Medicine

## 2021-08-21 ENCOUNTER — Other Ambulatory Visit: Payer: Self-pay

## 2021-08-21 DIAGNOSIS — R1011 Right upper quadrant pain: Secondary | ICD-10-CM

## 2021-08-21 DIAGNOSIS — K824 Cholesterolosis of gallbladder: Secondary | ICD-10-CM | POA: Diagnosis not present

## 2021-08-21 DIAGNOSIS — R109 Unspecified abdominal pain: Secondary | ICD-10-CM | POA: Diagnosis present

## 2021-08-21 LAB — CBC
HCT: 47 % — ABNORMAL HIGH (ref 36.0–46.0)
Hemoglobin: 15.6 g/dL — ABNORMAL HIGH (ref 12.0–15.0)
MCH: 30.8 pg (ref 26.0–34.0)
MCHC: 33.2 g/dL (ref 30.0–36.0)
MCV: 92.7 fL (ref 80.0–100.0)
Platelets: 214 10*3/uL (ref 150–400)
RBC: 5.07 MIL/uL (ref 3.87–5.11)
RDW: 12.9 % (ref 11.5–15.5)
WBC: 5.4 10*3/uL (ref 4.0–10.5)
nRBC: 0 % (ref 0.0–0.2)

## 2021-08-21 LAB — URINALYSIS, ROUTINE W REFLEX MICROSCOPIC
Bilirubin Urine: NEGATIVE
Glucose, UA: NEGATIVE mg/dL
Hgb urine dipstick: NEGATIVE
Ketones, ur: NEGATIVE mg/dL
Leukocytes,Ua: NEGATIVE
Nitrite: NEGATIVE
Protein, ur: NEGATIVE mg/dL
Specific Gravity, Urine: 1.02 (ref 1.005–1.030)
pH: 7 (ref 5.0–8.0)

## 2021-08-21 LAB — COMPREHENSIVE METABOLIC PANEL
ALT: 15 U/L (ref 0–44)
AST: 17 U/L (ref 15–41)
Albumin: 4.2 g/dL (ref 3.5–5.0)
Alkaline Phosphatase: 76 U/L (ref 38–126)
Anion gap: 10 (ref 5–15)
BUN: 19 mg/dL (ref 6–20)
CO2: 24 mmol/L (ref 22–32)
Calcium: 9.4 mg/dL (ref 8.9–10.3)
Chloride: 107 mmol/L (ref 98–111)
Creatinine, Ser: 0.75 mg/dL (ref 0.44–1.00)
GFR, Estimated: >60 mL/min (ref >60)
Glucose, Bld: 97 mg/dL (ref 70–99)
Potassium: 4.1 mmol/L (ref 3.5–5.1)
Sodium: 141 mmol/L (ref 135–145)
Total Bilirubin: 0.7 mg/dL (ref 0.3–1.2)
Total Protein: 6.9 g/dL (ref 6.5–8.1)

## 2021-08-21 LAB — LIPASE, BLOOD: Lipase: 54 U/L — ABNORMAL HIGH (ref 11–51)

## 2021-08-21 MED ORDER — OXYCODONE-ACETAMINOPHEN 5-325 MG PO TABS
1.0000 | ORAL_TABLET | Freq: Four times a day (QID) | ORAL | 0 refills | Status: DC | PRN
Start: 2021-08-21 — End: 2022-07-03

## 2021-08-21 MED ORDER — MORPHINE SULFATE (PF) 4 MG/ML IV SOLN
4.0000 mg | Freq: Once | INTRAVENOUS | Status: AC
  Administered 2021-08-21: 4 mg via INTRAVENOUS
  Filled 2021-08-21: qty 1

## 2021-08-21 NOTE — ED Triage Notes (Signed)
Pt reports right side flank pain since Thursday/ She went to Urgent care today and was sent here for eval for appy

## 2021-08-21 NOTE — Discharge Instructions (Signed)
Please pick up pain medication and take as needed for severe pain.   Follow up with [facility] for further evaluation of your pain/to discuss elective removal of your gallbladder  Return to the ED IMMEDIATELY for any new/worsening symptoms including worsening pain, fevers > 100.4, vomiting, yellowing of the skin, or any other new/concerning symptoms

## 2021-08-21 NOTE — ED Notes (Signed)
Patient provided with warm blanket for comfort.  Updated on plan of care ?

## 2021-08-21 NOTE — ED Provider Notes (Signed)
[facility] EMERGENCY DEPARTMENT Provider Note   CSN: [id number] Arrival date & time: 08/21/21  1719     History  Chief Complaint  Patient presents with   Flank Pain    Karen Gross is a 56 y.o. female who presents to the ED today from urgent care with complaint of gradual onset, constant, sharp, right sided abdominal pain that began 2 days ago.  Patient reports that she woke up with the pain Friday and has been dealing with it since that time.  She has been taking ibuprofen without relief.  She denies any nausea, vomiting, diarrhea, fevers, chills, urinary symptoms.  She went to urgent care today for further evaluation and was advised to come to the ED with concern for possible appendicitis.  Surgical history includes hysterectomy.   The history is provided by the patient and medical records.      Home Medications Prior to Admission medications   Medication Sig Start Date End Date Taking? Authorizing Provider  oxyCODONE-acetaminophen (PERCOCET/ROXICET) 5-325 MG tablet Take 1 tablet by mouth every 6 (six) hours as needed for severe pain. 08/21/21  Yes Janace Decker, PA-C  ARIPiprazole (ABILIFY) 10 MG tablet TAKE 1 TABLET BY MOUTH DAILY. 11/03/19   Opalski, Neoma Laming, DO  celecoxib (CELEBREX) 200 MG capsule Take 200 mg by mouth 2 (two) times daily. 02/22/21   [provider]  divalproex (DEPAKOTE) 500 MG DR tablet Take 500 mg by mouth daily.    [provider]  levothyroxine (SYNTHROID) 100 MCG tablet TAKE 1 TABLET BY MOUTH ON MONDAYS AND THURSDAYS ONLY IN THE MORNING BEFORE BREAKFAST 05/30/21   Abonza, Maritza, PA-C  levothyroxine (SYNTHROID) 112 MCG tablet TAKE 1 TABLET ON SUNDAY, TUESDAY, WEDNESDAY, FRIDAY AND SATURDAY IN THE MORNING BEFORE BREAKFAST 06/20/21   Abonza, Maritza, PA-C  omeprazole (PRILOSEC) 20 MG capsule Take 20 mg by mouth daily.    [provider]  oseltamivir (TAMIFLU) 75 MG capsule Take 1 capsule (75 mg total) by mouth daily.  05/26/21   Lorrene Reid, PA-C  topiramate (TOPAMAX) 100 MG tablet Take 1 tablet (100 mg total) by mouth 2 (two) times daily. 02/13/20   Lorrene Reid, PA-C      Allergies    Patient has no known allergies.    Review of Systems   Review of Systems  Constitutional:  Negative for chills and fever.  Gastrointestinal:  Positive for abdominal pain. Negative for constipation, diarrhea, nausea and vomiting.  Genitourinary:  Negative for dysuria, flank pain, frequency and hematuria.  All other systems reviewed and are negative.  Physical Exam Updated Vital Signs BP 134/80 (BP Location: Right Arm)    Pulse (!) 55    Temp 97.7 F (36.5 C) (Oral)    Resp 18    Ht 5' 6.5" (1.689 m)    Wt 80.7 kg    SpO2 99%    BMI 28.30 kg/m  Physical Exam Vitals and nursing note reviewed.  Constitutional:      Appearance: She is not ill-appearing or diaphoretic.  HENT:     Head: Normocephalic and atraumatic.  Eyes:     Conjunctiva/sclera: Conjunctivae normal.  Cardiovascular:     Rate and Rhythm: Normal rate and regular rhythm.     Pulses: Normal pulses.  Pulmonary:     Effort: Pulmonary effort is normal.     Breath sounds: Normal breath sounds. No wheezing, rhonchi or rales.  Abdominal:     Palpations: Abdomen is soft.     Tenderness: There is  abdominal tenderness in the right upper quadrant. There is no right CVA tenderness or left CVA tenderness. Positive signs include Murphy's sign.  Musculoskeletal:     Cervical back: Neck supple.  Skin:    General: Skin is warm and dry.  Neurological:     Mental Status: She is alert.    ED Results / Procedures / Treatments   Labs (all labs ordered are listed, but only abnormal results are displayed) Labs Reviewed  LIPASE, BLOOD - Abnormal; Notable for the following components:      Result Value   Lipase 54 (*)    All other components within normal limits  CBC - Abnormal; Notable for the following components:   Hemoglobin 15.6 (*)    HCT 47.0 (*)     All other components within normal limits  URINALYSIS, ROUTINE W REFLEX MICROSCOPIC  COMPREHENSIVE METABOLIC PANEL    EKG None  Radiology US Abdomen Limited RUQ (LIVER/GB)  Result Date: 08/21/2021 CLINICAL DATA:  Right upper quadrant pain EXAM: ULTRASOUND ABDOMEN LIMITED RIGHT UPPER QUADRANT COMPARISON:  CT 12/03/2017 FINDINGS: Gallbladder: No shadowing stones. Small gallbladder polyps measuring up to 4 mm. Slight increased wall thickness but no sonographic Murphy. Common bile duct: Diameter: 3 mm Liver: No focal lesion identified. Within normal limits in parenchymal echogenicity. Portal vein is patent on color Doppler imaging with normal direction of blood flow towards the liver. Other: Small benign-appearing septated cyst upper pole right kidney measuring 2 cm IMPRESSION: 1. Small gallbladder polyps with borderline wall thickening of gallbladder but no additional features suspicious for an acute cholecystitis 2. Ultrasound appearance of the liver is within normal limits. Electronically Signed   By: Donavan Foil M.D.   On: 08/21/2021 18:56    Procedures Procedures    Medications Ordered in ED Medications  morphine 4 MG/ML injection 4 mg (4 mg Intravenous Given 08/21/21 1825)    ED Course/ Medical Decision Making/ A&P                           Medical Decision Making 56 year old female who presents to the ED today with complaint of right-sided abdominal pain for the past 2 days without any other associated symptoms.  She went to urgent care today was advised to come to the ED for further evaluation for "possible appendicitis." On arrival to the ED today patient is afebrile, nontachycardic and nontachypneic.  She does appear mildly uncomfortable today secondary to pain.  Has been taking ibuprofen at home without relief.  She is noted to have right upper quadrant abdominal tenderness palpation.  There is no specific right lower quadrant abdominal tenderness palpation and negative McBurney  sign.  She does however have a positive Murphy sign on exam today.  No CVA tenderness.  Urinalysis has been collected and does not show any signs of infection or hemoglobin to suggest kidney stone, pyelonephritis, urinary tract infection.  We will add on right upper quadrant ultrasound at this time and await CBC, CMP, lipase.  Will provide morphine for pain and reevaluate.  If right upper quadrant ultrasound negative may consider CT scan for further evaluation.  RUQ ultrasound positive for gallbladder polyps and wall thickening.   Problems Addressed: Gallbladder polyp: acute illness or injury RUQ abdominal pain: acute illness or injury    Details: Gallbladder polyps and borderling wall thickening of gallbladder seen on ultrasound. Pt's pain resolved after IV morphine. Pt recommended to follow up with surgery in the outpatient setting. SHe  has been discharged with pain control. SHe has been instructed on strict return precautions. SHe is in agreement with plan and stable for discharge home.  Amount and/or Complexity of Data Reviewed Labs: ordered.    Details: CBC without leukocytosis. Hgb and HCT slightly elevated today at 15.6/47.0 CMP without electrolyte abnormalities. LFTs unremarkable at this time.  Lipase slightly elevated at 54 however not consistent with pancreatitis at this time.  U/A negative for leuks, nitrites, hgb. Radiology: ordered.    Details: Ultrasound: IMPRESSION:  1. Small gallbladder polyps with borderline wall thickening of  gallbladder but no additional features suspicious for an acute  cholecystitis  2. Ultrasound appearance of the liver is within normal limits. Discussion of management or test interpretation with external provider(s): Discussed case with general surgeon Dr. Brantley Stage who recommends pain control and outpatient follow up.   Risk Prescription drug management.           Final Clinical Impression(s) / ED Diagnoses Final diagnoses:  RUQ abdominal  pain  Gallbladder polyp    Rx / DC Orders ED Discharge Orders          Ordered    oxyCODONE-acetaminophen (PERCOCET/ROXICET) 5-325 MG tablet  Every 6 hours PRN        08/21/21 1941             Discharge Instructions      Please pick up pain medication and take as needed for severe pain.   Follow up with [facility] for further evaluation of your pain/to discuss elective removal of your gallbladder  Return to the ED IMMEDIATELY for any new/worsening symptoms including worsening pain, fevers > 100.4, vomiting, yellowing of the skin, or any other new/concerning symptoms       Eustaquio Maize, PA-C [id number]    Lianne Cure, DO [id number]

## 2021-08-22 ENCOUNTER — Other Ambulatory Visit: Payer: Self-pay | Admitting: Physician Assistant

## 2021-08-24 ENCOUNTER — Other Ambulatory Visit (HOSPITAL_COMMUNITY): Payer: Self-pay | Admitting: General Surgery

## 2021-08-24 ENCOUNTER — Other Ambulatory Visit: Payer: Self-pay | Admitting: General Surgery

## 2021-08-24 DIAGNOSIS — R1031 Right lower quadrant pain: Secondary | ICD-10-CM

## 2021-08-25 ENCOUNTER — Other Ambulatory Visit: Payer: Self-pay

## 2021-08-25 ENCOUNTER — Ambulatory Visit (HOSPITAL_BASED_OUTPATIENT_CLINIC_OR_DEPARTMENT_OTHER)
Admission: RE | Admit: 2021-08-25 | Discharge: 2021-08-25 | Disposition: A | Discharge status: Home / Self Care | Payer: BC Managed Care – PPO | Source: Ambulatory Visit | Attending: General Surgery | Admitting: General Surgery

## 2021-08-25 DIAGNOSIS — R1031 Right lower quadrant pain: Secondary | ICD-10-CM | POA: Diagnosis present

## 2021-08-25 MED ORDER — IOHEXOL 300 MG/ML  SOLN
100.0000 mL | Freq: Once | INTRAMUSCULAR | Status: AC | PRN
  Administered 2021-08-25: 100 mL via INTRAVENOUS

## 2021-09-26 ENCOUNTER — Other Ambulatory Visit: Payer: Self-pay | Admitting: Physician Assistant

## 2021-09-26 DIAGNOSIS — E039 Hypothyroidism, unspecified: Secondary | ICD-10-CM

## 2021-09-26 DIAGNOSIS — E038 Other specified hypothyroidism: Secondary | ICD-10-CM

## 2021-10-10 NOTE — Progress Notes (Deleted)
?Karen Gross D.O. ?[facility] ?[address] [place] [address] ?Phone: [phone] ?Subjective:   ? ?I'm seeing this patient by the request  of:  Mayer Masker, PA-C ? ?CC:  ? ?[id number]  ?Karen Gross is a 56 y.o. female coming in with complaint of mid-back pain. Patient states  ? ? ?  ? ?Past Medical History:  ?Diagnosis Date  ? Anxiety   ? Bartholin cyst   ? GERD (gastroesophageal reflux disease)   ? Hip pain   ? Hypothyroidism   ? Knee pain   ? Seizures (HCC)   ? 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 ?Past Surgical History:  ?Procedure Laterality Date  ? ABDOMINAL HYSTERECTOMY    ? KNEE ARTHROSCOPY Right   ? ?Social History  ? ?Socioeconomic History  ? Marital status: Single  ?  Spouse name: Not on file  ? Number of children: Not on file  ? Years of education: Not on file  ? Highest education level: Not on file  ?Occupational History  ? 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 Smoking status: Never  ? Smokeless tobacco: Never  ?Vaping Use  ? Vaping Use: Never used  ?Substance and Sexual Activity  ? Alcohol use: Yes  ?  Comment: once a month- wine  ? Drug use: No  ? Sexual activity: Yes  ?  Birth control/protection: None  ?Other Topics Concern  ? Not on file  ?Social History Narrative  ? Not on file  ? ?Social Determinants of Health  ? ?Financial Resource Strain: Not on file  ?Food Insecurity: Not on file  ?Transportation Needs: Not on file  ?Physical Activity: Not on file  ?Stress: Not on file  ?Social Connections: Not on file  ? ?No Known Allergies ?Family History  ?Problem Relation Age of Onset  ? Breast cancer Mother   ? Heart disease Mother   ? High blood pressure Mother   ? Cancer Mother   ? Depression Mother   ? Breast cancer Paternal Grandmother   ? Colon cancer Neg Hx   ? Stomach cancer Neg Hx   ? Colon polyps Neg Hx   ? Esophageal cancer Neg Hx   ? Rectal cancer Neg Hx   ? ? ?Current Outpatient Medications (Endocrine & Metabolic):  ?  levothyroxine (SYNTHROID) 100 MCG  tablet, TAKE 1 TABLET BY MOUTH ON MONDAYS AND THURSDAYS ONLY IN THE MORNING BEFORE BREAKFAST ?  levothyroxine (SYNTHROID) 112 MCG tablet, TAKE 1 TABLET ON SUNDAY, TUESDAY, WEDNESDAY, FRIDAY AND SATURDAY IN THE MORNING BEFORE BREAKFAST ? ? ? ?Current Outpatient Medications (Analgesics):  ?  celecoxib (CELEBREX) 200 MG capsule, Take 200 mg by mouth 2 (two) times daily. ?  oxyCODONE-acetaminophen (PERCOCET/ROXICET) 5-325 MG tablet, Take 1 tablet by mouth every 6 (six) hours as needed for severe pain. ? ? ?Current Outpatient Medications (Other):  ?  ARIPiprazole (ABILIFY) 10 MG tablet, TAKE 1 TABLET BY MOUTH DAILY. ?  divalproex (DEPAKOTE) 500 MG DR tablet, Take 500 mg by mouth daily. ?  omeprazole (PRILOSEC) 20 MG capsule, Take 20 mg by mouth daily. ?  oseltamivir (TAMIFLU) 75 MG capsule, Take 1 capsule (75 mg total) by mouth daily. ?  topiramate (TOPAMAX) 100 MG tablet, Take 1 tablet (100 mg total) by mouth 2 (two) times daily. ? ? ?Reviewed prior external information including notes and imaging from  ?primary care provider ?As well as notes that were available from care everywhere and other healthcare systems. ? ?Past  medical history, social, surgical and family history all reviewed in electronic medical record.  No pertanent information unless stated regarding to the chief complaint.  ? ?Review of Systems: ? No headache, visual changes, nausea, vomiting, diarrhea, constipation, dizziness, abdominal pain, skin rash, fevers, chills, night sweats, weight loss, swollen lymph nodes, body aches, joint swelling, chest pain, shortness of breath, mood changes. POSITIVE muscle aches ? ?Objective  ?There were no vitals taken for this visit. ?  ?General: No apparent distress alert and oriented x3 mood and affect normal, dressed appropriately.  ?HEENT: Pupils equal, extraocular movements intact  ?Respiratory: Patient's speak in full sentences and does not appear short of breath  ?Cardiovascular: No lower extremity edema, non  tender, no erythema  ?Gait normal with good balance and coordination.  ?MSK:  Non tender with full range of motion and good stability and symmetric strength and tone of shoulders, elbows, wrist, hip, knee and ankles bilaterally.  ? ?  ?Impression and Recommendations:  ?  ? ?The above documentation has been reviewed and is accurate and complete [name] ? ? ?

## 2021-10-13 ENCOUNTER — Ambulatory Visit: Payer: BC Managed Care – PPO | Admitting: Family Medicine

## 2021-10-17 ENCOUNTER — Encounter (INDEPENDENT_AMBULATORY_CARE_PROVIDER_SITE_OTHER): Payer: Self-pay | Admitting: Adult Health

## 2021-10-17 ENCOUNTER — Ambulatory Visit (INDEPENDENT_AMBULATORY_CARE_PROVIDER_SITE_OTHER): Payer: BC Managed Care – PPO | Admitting: Adult Health

## 2021-10-17 ENCOUNTER — Other Ambulatory Visit: Payer: Self-pay

## 2021-10-17 VITALS — BP 103/66 | HR 75 | Temp 97.8°F | Ht 66.0 in | Wt 170.0 lb

## 2021-10-17 DIAGNOSIS — E7849 Other hyperlipidemia: Secondary | ICD-10-CM | POA: Diagnosis not present

## 2021-10-17 DIAGNOSIS — F4321 Adjustment disorder with depressed mood: Secondary | ICD-10-CM | POA: Insufficient documentation

## 2021-10-17 DIAGNOSIS — E039 Hypothyroidism, unspecified: Secondary | ICD-10-CM

## 2021-10-17 DIAGNOSIS — E669 Obesity, unspecified: Secondary | ICD-10-CM

## 2021-10-17 DIAGNOSIS — Z6827 Body mass index (BMI) 27.0-27.9, adult: Secondary | ICD-10-CM

## 2021-10-17 NOTE — Progress Notes (Signed)
? ? ? ?Chief Complaint:  ? ?OBESITY ?Karen Gross is here to discuss her progress with her obesity treatment plan along with follow-up of her obesity related diagnoses. Karen Gross is on the Category 2 Plan Monday through Friday and keeping a food journal and adhering to recommended goals of 1200 calories and 85 grams of protein on the weekend and states she is following her eating plan approximately 0% of the time. Karen Gross states she is not exercising regularly at this time.  ? ?Today's visit was #: 15 ?Starting weight: 217 lbs ?Starting date: 10/01/2019 ?Today's weight: 170 lbs ?Today's date: 10/17/2021 ?Total lbs lost to date: 47 lbs ?Total lbs lost since last in-office visit: 0 ? ?Interim History: Karen Gross has been in maintenance phase since 03/11/2020.   ?Follow-up every 4-5 months.   ?She has lost 47 lbs - maintaining BMI in the 27 range at last several follow-up appt's. ? ?Her father passed on [id number] - age 58 - complications of pulmonary fibrosis. ?Her mother has dementia - age 70 - still at home. ? ?She reports acute grief/sadness.  She is 1 of 5 daughters and reports strong emotional support system. ?She plans on returning to work today. ? ?Subjective:  ? ?1. Hypothyroidism, unspecified type ?She reports stable energy levels. ?She is on alternating dose of levothyroxine 1000 mcg/112 mcg - managed by PCP. ? ?2. Other hyperlipidemia ?She is not statin therapy.  Denies acute cardiac symptoms. ?On 01/12/2021 - lipid panel stable. ? ?3. Grief ?Her father passed on [id number] - age 51 - complications of pulmonary fibrosis. ?Her mother has dementia - age 74 - still at home. ?She reports acute grief/sadness.  She is 1 of 5 daughters and reports strong emotional support system. ?She plans on returning to work today. ?She has an established therapist and is seen (virtual) a few times/month. ?She denies SI/HI. ? ? ?Assessment/Plan:  ? ?1. Hypothyroidism, unspecified type ?Continue levothyroxine dosing per PCP. ? ?2. Other  hyperlipidemia ?Increase regular walking. ? ?3. Grief ?Increase water intake, increase rest.   ?Follow-up with established therapist. ? ?4. Current BMI 27.5 ? ?Karen Gross is currently in the action stage of change. As such, her goal is to continue with weight loss efforts. She has agreed to the Category 2 Plan Monday through Friday and keeping a food journal and adhering to recommended goals of 1200 calories and 85 grams of protein on the Saturday and Sunday.  ? ?Exercise goals:  As is. ? ?Behavioral modification strategies: increasing lean protein intake, decreasing simple carbohydrates, keeping healthy foods in the home, ways to avoid boredom eating, and planning for success. ? ?Yomna has agreed to follow-up with our clinic in 4-5 months. She was informed of the importance of frequent follow-up visits to maximize her success with intensive lifestyle modifications for her multiple health conditions.  ? ?Objective:  ? ?Blood pressure 103/66, pulse 75, temperature 97.8 ?F (36.6 ?C), height 5\' 6"  (1.676 m), weight 170 lb (77.1 kg), SpO2 99 %. ?Body mass index is 27.44 kg/m?. ? ?General: Cooperative, alert, well developed, in no acute distress. ?HEENT: Conjunctivae and lids unremarkable. ?Cardiovascular: Regular rhythm.  ?Lungs: Normal work of breathing. ?Neurologic: No focal deficits.  ? ?Lab Results  ?Component Value Date  ? CREATININE 0.75 08/21/2021  ? BUN 19 08/21/2021  ? NA 141 08/21/2021  ? K 4.1 08/21/2021  ? CL 107 08/21/2021  ? CO2 24 08/21/2021  ? ?Lab Results  ?Component Value Date  ? ALT 15 08/21/2021  ? AST 17 08/21/2021  ?  ALKPHOS 76 08/21/2021  ? BILITOT 0.7 08/21/2021  ? ?Lab Results  ?Component Value Date  ? HGBA1C 5.0 01/12/2021  ? HGBA1C 5.1 11/05/2019  ? HGBA1C 5.3 03/11/2019  ? HGBA1C 5.3 09/27/2017  ? HGBA1C 5.2 11/12/2014  ? ?Lab Results  ?Component Value Date  ? INSULIN 6.5 10/01/2019  ? ?Lab Results  ?Component Value Date  ? TSH 2.060 01/12/2021  ? ?Lab Results  ?Component Value Date  ? CHOL  183 01/12/2021  ? HDL 64 01/12/2021  ? LDLCALC 104 (H) 01/12/2021  ? TRIG 82 01/12/2021  ? CHOLHDL 2.9 01/12/2021  ? ?Lab Results  ?Component Value Date  ? VD25OH 52.1 01/12/2021  ? VD25OH 32.9 10/28/2020  ? VD25OH 64.9 07/01/2020  ? ?Lab Results  ?Component Value Date  ? WBC 5.4 08/21/2021  ? HGB 15.6 (H) 08/21/2021  ? HCT 47.0 (H) 08/21/2021  ? MCV 92.7 08/21/2021  ? PLT 214 08/21/2021  ? ?Lab Results  ?Component Value Date  ? FERRITIN 24 10/04/2020  ? ?Attestation Statements:  ? ?Reviewed by clinician on day of visit: allergies, medications, problem list, medical history, surgical history, family history, social history, and previous encounter notes. ? ?Time spent on visit including pre-visit chart review and post-visit care and charting was 28 minutes.  ? ?I, Water quality scientist, CMA, am acting as Location manager for Mina Marble, NP. ? ?I have reviewed the above documentation for accuracy and completeness, and I agree with the above. -  Sinai Mahany d. Aamani Moose, NP-C ?

## 2021-10-19 NOTE — Progress Notes (Signed)
?Karen Gross D.O. ?[facility] ?[address] [place] [address] ?Phone: [phone] ?Subjective:   ?I, Karen Gross, am serving as a scribe for Dr. Antoine Primas. ? ?This visit occurred during the SARS-CoV-2 public health emergency.  Safety protocols were in place, including screening questions prior to the visit, additional usage of staff PPE, and extensive cleaning of exam room while observing appropriate contact time as indicated for disinfecting solutions.  ? ? ?I'm seeing this patient by the request  of:  Abonza, Maritza, PA-C ? ?CC: thoracic and lumbar pain  ? ?[id number]  ?Karen Gross is a 56 y.o. female coming in with complaint of thoracic and lumbar spine pain for past year.Tried IBU and heat for pain relief. Pain worse with standing. Pain is dull and achy and sometimes burning.  ? ?  ? ?Past Medical History:  ?Diagnosis Date  ? Anxiety   ? Bartholin cyst   ? GERD (gastroesophageal reflux disease)   ? Hip pain   ? Hypothyroidism   ? Knee pain   ? Seizures (HCC)   ? 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 ?Past Surgical History:  ?Procedure Laterality Date  ? ABDOMINAL HYSTERECTOMY    ? KNEE ARTHROSCOPY Right   ? ?Social History  ? ?Socioeconomic History  ? Marital status: Single  ?  Spouse name: Not on file  ? Number of children: Not on file  ? Years of education: Not on file  ? Highest education level: Not on file  ?Occupational History  ? 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 Smoking status: Never  ? Smokeless tobacco: Never  ?Vaping Use  ? Vaping Use: Never used  ?Substance and Sexual Activity  ? Alcohol use: Yes  ?  Comment: once a month- wine  ? Drug use: No  ? Sexual activity: Yes  ?  Birth control/protection: None  ?Other Topics Concern  ? Not on file  ?Social History Narrative  ? Not on file  ? ?Social Determinants of Health  ? ?Financial Resource Strain: Not on file  ?Food Insecurity: Not on file  ?Transportation Needs: Not on file  ?Physical Activity: Not on file   ?Stress: Not on file  ?Social Connections: Not on file  ? ?No Known Allergies ?Family History  ?Problem Relation Age of Onset  ? Breast cancer Mother   ? Heart disease Mother   ? High blood pressure Mother   ? Cancer Mother   ? Depression Mother   ? Breast cancer Paternal Grandmother   ? Colon cancer Neg Hx   ? Stomach cancer Neg Hx   ? Colon polyps Neg Hx   ? Esophageal cancer Neg Hx   ? Rectal cancer Neg Hx   ? ? ?Current Outpatient Medications (Endocrine & Metabolic):  ?  levothyroxine (SYNTHROID) 100 MCG tablet, TAKE 1 TABLET BY MOUTH ON MONDAYS AND THURSDAYS ONLY IN THE MORNING BEFORE BREAKFAST ?  levothyroxine (SYNTHROID) 112 MCG tablet, TAKE 1 TABLET ON SUNDAY, TUESDAY, WEDNESDAY, FRIDAY AND SATURDAY IN THE MORNING BEFORE BREAKFAST ? ? ? ?Current Outpatient Medications (Analgesics):  ?  oxyCODONE-acetaminophen (PERCOCET/ROXICET) 5-325 MG tablet, Take 1 tablet by mouth every 6 (six) hours as needed for severe pain. ?  celecoxib (CELEBREX) 200 MG capsule, Take 200 mg by mouth 2 (two) times daily. ? ? ?Current Outpatient Medications (Other):  ?  ARIPiprazole (ABILIFY) 10 MG tablet, TAKE 1 TABLET BY MOUTH DAILY. ?  divalproex (DEPAKOTE) 500 MG DR tablet, Take 500  mg by mouth daily. ?  gabapentin (NEURONTIN) 100 MG capsule, Take 2 capsules (200 mg total) by mouth at bedtime. ?  omeprazole (PRILOSEC) 20 MG capsule, Take 20 mg by mouth daily. ?  topiramate (TOPAMAX) 100 MG tablet, Take 1 tablet (100 mg total) by mouth 2 (two) times daily. ?  oseltamivir (TAMIFLU) 75 MG capsule, Take 1 capsule (75 mg total) by mouth daily. ? ? ?Reviewed prior external information including notes and imaging from  ?primary care provider ?As well as notes that were available from care everywhere and other healthcare systems. ? ?Past medical history, social, surgical and family history all reviewed in electronic medical record.  No pertanent information unless stated regarding to the chief complaint.  ? ?Review of Systems: ? No  headache, visual changes, nausea, vomiting, diarrhea, constipation, dizziness, abdominal pain, skin rash, fevers, chills, night sweats, weight loss, swollen lymph nodes, body aches, joint swelling, chest pain, shortness of breath, mood changes. POSITIVE muscle aches ? ?Objective  ?Blood pressure 106/74, pulse 67, height 5\' 6"  (1.676 m), weight 177 lb (80.3 kg), SpO2 97 %. ?  ?General: No apparent distress alert and oriented x3 mood and affect normal, dressed appropriately.  ?HEENT: Pupils equal, extraocular movements intact  ?Respiratory: Patient's speak in full sentences and does not appear short of breath  ?Cardiovascular: No lower extremity edema, non tender, no erythema  ?Gait normal with good balance and coordination.  ?MSK: Back exam does have some loss of lordosis.  Patient does have some tenderness to palpation of the paraspinal musculature.  Tightness noted more in the thoracolumbar juncture.  Some pain over the lumbosacral area but not as severe. ? ?Osteopathic findings ?T8 extended rotated and side bent right  ?L1 flexed rotated and side bent right ?Sacrum right on right ? ? ?  ?Impression and Recommendations:  ?  ? ?The above documentation has been reviewed and is accurate and complete , DO ? ? ? ?

## 2021-10-20 ENCOUNTER — Other Ambulatory Visit: Payer: Self-pay

## 2021-10-20 ENCOUNTER — Encounter: Payer: Self-pay | Admitting: Family Medicine

## 2021-10-20 ENCOUNTER — Ambulatory Visit (INDEPENDENT_AMBULATORY_CARE_PROVIDER_SITE_OTHER): Payer: BC Managed Care – PPO

## 2021-10-20 ENCOUNTER — Ambulatory Visit (INDEPENDENT_AMBULATORY_CARE_PROVIDER_SITE_OTHER): Payer: BC Managed Care – PPO | Admitting: Family Medicine

## 2021-10-20 VITALS — BP 106/74 | HR 67 | Ht 66.0 in | Wt 177.0 lb

## 2021-10-20 DIAGNOSIS — M9904 Segmental and somatic dysfunction of sacral region: Secondary | ICD-10-CM

## 2021-10-20 DIAGNOSIS — M9903 Segmental and somatic dysfunction of lumbar region: Secondary | ICD-10-CM | POA: Diagnosis not present

## 2021-10-20 DIAGNOSIS — M9902 Segmental and somatic dysfunction of thoracic region: Secondary | ICD-10-CM | POA: Diagnosis not present

## 2021-10-20 DIAGNOSIS — M549 Dorsalgia, unspecified: Secondary | ICD-10-CM | POA: Insufficient documentation

## 2021-10-20 DIAGNOSIS — M545 Low back pain, unspecified: Secondary | ICD-10-CM

## 2021-10-20 MED ORDER — GABAPENTIN 100 MG PO CAPS: 200.0000 mg | ORAL_CAPSULE | Freq: Every day | ORAL | 3 refills | Status: DC

## 2021-10-20 NOTE — Patient Instructions (Signed)
Xray on way out

## 2021-10-20 NOTE — Assessment & Plan Note (Signed)

## 2021-10-20 NOTE — Assessment & Plan Note (Signed)
Patient does have more lumbar back pain, seems to be more muscular in nature but patient does have signs and symptoms consistent with potential spinal stenosis. ? ?Home exercises given ?Likely upper extremity right greater than left.  X-rays are pending.  Discussed with patient to continue to stay active 6 weeks.  Likely will continue with osteopathic manipulation or consider formal physical therapy or advanced imaging. ?

## 2021-11-16 ENCOUNTER — Ambulatory Visit: Payer: BC Managed Care – PPO | Admitting: Family Medicine

## 2021-11-22 NOTE — Progress Notes (Signed)
?Terrilee Files D.O. ?[facility] ?[address] [place] [address] ?Phone: [phone] ?Subjective:   ?I, [name], am serving as a scribe for Dr. Antoine Primas. ? ?This visit occurred during the SARS-CoV-2 public health emergency.  Safety protocols were in place, including screening questions prior to the visit, additional usage of staff PPE, and extensive cleaning of exam room while observing appropriate contact time as indicated for disinfecting solutions.  ? ? ?I'm seeing this patient by the request  of:  Abonza, Maritza, PA-C ? ?CC: back and neck pain  ? ?[id number]  ?Karen Gross is a 56 y.o. female coming in with complaint of back and neck pain. OMT 3/23/202. Patient states that she is doing better.  Patient states that she has not noticed as much significant tenderness.  Has been doing the exercises occasionally. ? ?Medications patient has been prescribed: Gabapentin ? ?Taking: ? ? ?  ? ? ? ? ?Reviewed prior external information including notes and imaging from previsou exam, outside providers and external EMR if available.  ? ?As well as notes that were available from care everywhere and other healthcare systems. ? ?Past medical history, social, surgical and family history all reviewed in electronic medical record.  No pertanent information unless stated regarding to the chief complaint.  ? ?Past Medical History:  ?Diagnosis Date  ? Anxiety   ? Bartholin cyst   ? GERD (gastroesophageal reflux disease)   ? Hip pain   ? Hypothyroidism   ? Knee pain   ? Seizures (HCC)   ? 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  ?No Known Allergies ? ? ?Review of Systems: ? No headache, visual changes, nausea, vomiting, diarrhea, constipation, dizziness, abdominal pain, skin rash, fevers, chills, night sweats, weight loss, swollen lymph nodes, body aches, joint swelling, chest pain, shortness of breath, mood changes. POSITIVE muscle aches ? ?Objective  ?Blood pressure 118/76, pulse 66,  height 5\' 6"  (1.676 m), weight 182 lb (82.6 kg), SpO2 99 %. ?  ?General: No apparent distress alert and oriented x3 mood and affect normal, dressed appropriately.  ?HEENT: Pupils equal, extraocular movements intact  ?Respiratory: Patient's speak in full sentences and does not appear short of breath  ?Cardiovascular: No lower extremity edema, non tender, no erythema  ?Back exam does have some tightness noted in the lumbar spine still noted.  Some tightness with FABER test.  It does also have some increase in tightness noted of the thoracolumbar juncture in the parascapular region right greater than left ? ?Osteopathic findings ? ? ?C4 flexed rotated and side bent left ?T3 extended rotated and side bent right inhaled rib ?T8 extended rotated and side bent left ?L2 flexed rotated and side bent right ?Sacrum right on right ? ? ? ? ?  ?Assessment and Plan: ? ?Lumbar back pain ?Low back pain noted.  Discussed icing regimen and home exercises, which activities to do and which ones to avoid, patient has made some progress overall.  Encouraged her to continue work on as well.  Does have the gabapentin for breakthrough.  Follow-up again in 6 to 8 weeks  ? ?Nonallopathic problems ? ?Decision today to treat with OMT was based on Physical Exam ? ?After verbal consent patient was treated with HVLA, ME, FPR techniques in cervical, rib, thoracic, lumbar, and sacral  areas ? ?Patient tolerated the procedure well with improvement in symptoms ? ?Patient given exercises, stretches and lifestyle modifications ? ?See medications in patient  instructions if given ? ?Patient will follow up in 4-8 weeks ? ?  ? ?The above documentation has been reviewed and is accurate and complete Judi Saa, DO ? ? ? ?  ? ? Note: This dictation was prepared with Dragon dictation along with smaller phrase technology. Any transcriptional errors that result from this process are unintentional.    ?  ?  ? ?

## 2021-11-23 ENCOUNTER — Ambulatory Visit (INDEPENDENT_AMBULATORY_CARE_PROVIDER_SITE_OTHER): Payer: BC Managed Care – PPO | Admitting: Family Medicine

## 2021-11-23 VITALS — BP 118/76 | HR 66 | Ht 66.0 in | Wt 182.0 lb

## 2021-11-23 DIAGNOSIS — M545 Low back pain, unspecified: Secondary | ICD-10-CM

## 2021-11-23 DIAGNOSIS — M9904 Segmental and somatic dysfunction of sacral region: Secondary | ICD-10-CM

## 2021-11-23 DIAGNOSIS — M9902 Segmental and somatic dysfunction of thoracic region: Secondary | ICD-10-CM | POA: Diagnosis not present

## 2021-11-23 DIAGNOSIS — M9901 Segmental and somatic dysfunction of cervical region: Secondary | ICD-10-CM | POA: Diagnosis not present

## 2021-11-23 DIAGNOSIS — M9908 Segmental and somatic dysfunction of rib cage: Secondary | ICD-10-CM | POA: Diagnosis not present

## 2021-11-23 DIAGNOSIS — M9903 Segmental and somatic dysfunction of lumbar region: Secondary | ICD-10-CM | POA: Diagnosis not present

## 2021-11-23 NOTE — Patient Instructions (Signed)
Enjoy baseball season  ?Keep working exercises ?Stand on wall with heels, butt, and shoulders for 5 minutes ?See me in 2-3 months ?

## 2021-11-23 NOTE — Assessment & Plan Note (Signed)
Low back pain noted.  Discussed icing regimen and home exercises, which activities to do and which ones to avoid, patient has made some progress overall.  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  Does have the gabapentin for breakthrough.  Follow-up again in 6 to 8 weeks ?

## 2021-12-28 ENCOUNTER — Other Ambulatory Visit: Payer: Self-pay | Admitting: Physician Assistant

## 2021-12-28 DIAGNOSIS — E039 Hypothyroidism, unspecified: Secondary | ICD-10-CM

## 2021-12-28 DIAGNOSIS — E038 Other specified hypothyroidism: Secondary | ICD-10-CM

## 2022-01-03 ENCOUNTER — Other Ambulatory Visit: Payer: Self-pay | Admitting: Physician Assistant

## 2022-01-03 DIAGNOSIS — E039 Hypothyroidism, unspecified: Secondary | ICD-10-CM

## 2022-01-03 DIAGNOSIS — E038 Other specified hypothyroidism: Secondary | ICD-10-CM

## 2022-01-18 NOTE — Progress Notes (Unsigned)
  [facility] [address] [place] [address] Phone: [phone] Subjective:    I'm seeing this patient by the request  of:  Mayer Masker, PA-C  CC:   [id number]  Karen Gross is a 56 y.o. female coming in with complaint of back and neck pain. OMT 11/23/2021. Patient states   Medications patient has been prescribed: Gabapentin  Taking:         Reviewed prior external information including notes and imaging from previsou exam, outside providers and external EMR if available.   As well as notes that were available from care everywhere and other healthcare systems.  Past medical history, social, surgical and family history all reviewed in electronic medical record.  No pertanent information unless stated regarding to the chief complaint.   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 POSITIVE muscle aches  Objective  There were no vitals taken for this visit.   General: No apparent distress alert and oriented x3 mood and affect normal, dressed appropriately.  HEENT: Pupils equal, extraocular movements intact  Respiratory: Patient's speak in full sentences and does not appear short of breath  Cardiovascular: No lower extremity edema, non tender, no erythema  Gait MSK:  Back   Osteopathic findings  C2 flexed rotated and side bent right C6 flexed rotated and side bent left T3 extended rotated and side bent right inhaled rib T9 extended rotated and side bent  left L2 flexed rotated and side bent right Sacrum right on right       Assessment and Plan:  No problem-specific Assessment & Plan notes found for this encounter.    Nonallopathic problems  Decision today to treat with OMT was based on Physical Exam  After verbal consent patient was treated with HVLA, ME, FPR techniques in cervical, rib, thoracic, lumbar, and sacral  areas  Patient tolerated the procedure well with improvement in symptoms  Patient given exercises, stretches and lifestyle modifications  See medications in patient instructions if given  Patient will follow up in 4-8 weeks             Note: This dictation was prepared with Dragon dictation along with smaller phrase technology. Any transcriptional errors that result from this process are unintentional.

## 2022-01-19 ENCOUNTER — Ambulatory Visit (INDEPENDENT_AMBULATORY_CARE_PROVIDER_SITE_OTHER): Payer: BC Managed Care – PPO | Admitting: Adult Health

## 2022-01-24 ENCOUNTER — Encounter: Payer: Self-pay | Admitting: Family Medicine

## 2022-01-24 ENCOUNTER — Ambulatory Visit (INDEPENDENT_AMBULATORY_CARE_PROVIDER_SITE_OTHER): Payer: BC Managed Care – PPO | Admitting: Family Medicine

## 2022-01-24 VITALS — BP 98/62 | HR 65 | Ht 66.0 in | Wt 180.0 lb

## 2022-01-24 DIAGNOSIS — M545 Low back pain, unspecified: Secondary | ICD-10-CM

## 2022-01-24 DIAGNOSIS — M9905 Segmental and somatic dysfunction of pelvic region: Secondary | ICD-10-CM

## 2022-01-24 DIAGNOSIS — M9901 Segmental and somatic dysfunction of cervical region: Secondary | ICD-10-CM | POA: Diagnosis not present

## 2022-01-24 DIAGNOSIS — M9903 Segmental and somatic dysfunction of lumbar region: Secondary | ICD-10-CM

## 2022-01-24 DIAGNOSIS — M9904 Segmental and somatic dysfunction of sacral region: Secondary | ICD-10-CM

## 2022-01-24 DIAGNOSIS — M9902 Segmental and somatic dysfunction of thoracic region: Secondary | ICD-10-CM

## 2022-01-24 MED ORDER — TIZANIDINE HCL 2 MG PO TABS: 2.0000 mg | ORAL_TABLET | Freq: Every day | ORAL | 0 refills | Status: DC

## 2022-01-24 MED ORDER — METHYLPREDNISOLONE ACETATE 80 MG/ML IJ SUSP
80.0000 mg | Freq: Once | INTRAMUSCULAR | Status: AC
  Administered 2022-01-24: 80 mg via INTRAMUSCULAR

## 2022-01-24 MED ORDER — KETOROLAC TROMETHAMINE 60 MG/2ML IM SOLN
60.0000 mg | Freq: Once | INTRAMUSCULAR | Status: AC
  Administered 2022-01-24: 60 mg via INTRAMUSCULAR

## 2022-01-27 ENCOUNTER — Telehealth: Payer: Self-pay | Admitting: Family Medicine

## 2022-01-27 NOTE — Telephone Encounter (Signed)
Pt states she was advised to call us this week and tell us how she is doing. Feels OK Sore, but thinks this is from the adjustment.

## 2022-02-11 ENCOUNTER — Other Ambulatory Visit: Payer: Self-pay | Admitting: Physician Assistant

## 2022-02-22 ENCOUNTER — Ambulatory Visit (INDEPENDENT_AMBULATORY_CARE_PROVIDER_SITE_OTHER): Payer: BC Managed Care – PPO | Admitting: Adult Health

## 2022-02-28 ENCOUNTER — Ambulatory Visit (INDEPENDENT_AMBULATORY_CARE_PROVIDER_SITE_OTHER): Payer: BC Managed Care – PPO | Admitting: Adult Health

## 2022-02-28 ENCOUNTER — Encounter (INDEPENDENT_AMBULATORY_CARE_PROVIDER_SITE_OTHER): Payer: Self-pay | Admitting: Adult Health

## 2022-02-28 ENCOUNTER — Other Ambulatory Visit: Payer: Self-pay | Admitting: Family Medicine

## 2022-02-28 VITALS — BP 85/55 | HR 66 | Temp 97.8°F | Ht 66.0 in | Wt 174.0 lb

## 2022-02-28 DIAGNOSIS — E669 Obesity, unspecified: Secondary | ICD-10-CM | POA: Diagnosis not present

## 2022-02-28 DIAGNOSIS — Z6828 Body mass index (BMI) 28.0-28.9, adult: Secondary | ICD-10-CM

## 2022-02-28 DIAGNOSIS — E039 Hypothyroidism, unspecified: Secondary | ICD-10-CM

## 2022-02-28 DIAGNOSIS — Z0000 Encounter for general adult medical examination without abnormal findings: Secondary | ICD-10-CM

## 2022-03-02 NOTE — Progress Notes (Signed)
Chief Complaint:   OBESITY Karen Gross is here to discuss her progress with her obesity treatment plan along with follow-up of her obesity related diagnoses. Karen Gross is on the Category 2 Plan and states she is following her eating plan approximately 50% of the time. Karen Gross states she is not currently exercising.  Today's visit was #: 16 Starting weight: 217 lbs Starting date: 10/01/2019 Today's weight: 174 lbs Today's date: 02/28/22 Total lbs lost to date: 43 Total lbs lost since last in-office visit: +4  Interim History:  Ms. Karen Gross has been in maintenance phase since 03/11/2020.   Follow-up every 4 to 5 months, last [facility] office visit 10/17/2021.  She is 1 of 5 sisters-rotate home to stay with mom (83-and decline with dementia), 24/7 sister coverage.  10/17/2021 - 02/28/2022 Muscle mass +10.6 pounds Adipose mass -7.4 pounds.   Subjective:   1. Hypothyroidism, unspecified type PCP manages levothyroxine therapy- levothyroxine (SYNTHROID) 112 MCG tablet    TAKE 1 TABLET ON SUNDAY, TUESDAY, WEDNESDAY, FRIDAY AND SATURDAY IN THE MORNING BEFORE BREAKFAST    levothyroxine (SYNTHROID) 100 MCG tablet    TAKE 1 TABLET BY MOUTH ON MONDAYS AND THURSDAYS ONLY IN THE MORNING BEFORE BREAKFAST   She endorses stable energy levels. 01/12/2021 TSH-2.060-therapeutic.  2. Healthcare maintenance Mx phase with [facility] since 03/20/20.  Assessment/Plan:   1. Hypothyroidism, unspecified type Continue levothyroxine per PCP.  2. Healthcare maintenance Follow-up [facility] 4-5 months.  3. Obesity current BMI-28.1 Karen Gross is currently in the action stage of change. As such, her goal is to maintain weight for now. She has agreed to the Category 2 Plan Monday through Friday Keep a food journal and adhering to recommended goals of 1200 calories and 85 gms protein on the weekends.  Exercise goals: All adults should avoid inactivity. Some physical activity is better  than none, and adults who participate in any amount of physical activity gain some health benefits.  Behavioral modification strategies: increasing lean protein intake, decreasing simple carbohydrates, meal planning and cooking strategies, keeping healthy foods in the home, and planning for success.  Karen Gross has agreed to follow-up with our clinic in 4 to 5 months. She was informed of the importance of frequent follow-up visits to maximize her success with intensive lifestyle modifications for her multiple health conditions.    Objective:   Blood pressure (!) 85/55, pulse 66, temperature 97.8 F (36.6 C), height 5\' 6"  (1.676 m), weight 174 lb (78.9 kg), SpO2 94 %. Body mass index is 28.08 kg/m.  General: Cooperative, alert, well developed, in no acute distress. HEENT: Conjunctivae and lids unremarkable. Cardiovascular: Regular rhythm.  Lungs: Normal work of breathing. Neurologic: No focal deficits.   Lab Results  Component Value Date   CREATININE 0.75 08/21/2021   BUN 19 08/21/2021   NA 141 08/21/2021   K 4.1 08/21/2021   CL 107 08/21/2021   CO2 24 08/21/2021   Lab Results  Component Value Date   ALT 15 08/21/2021   AST 17 08/21/2021   ALKPHOS 76 08/21/2021   BILITOT 0.7 08/21/2021   Lab Results  Component Value Date   HGBA1C 5.0 01/12/2021   HGBA1C 5.1 11/05/2019   HGBA1C 5.3 03/11/2019   HGBA1C 5.3 09/27/2017   HGBA1C 5.2 11/12/2014   Lab Results  Component Value Date   INSULIN 6.5 10/01/2019   Lab Results  Component Value Date   TSH 2.060 01/12/2021   Lab Results  Component Value Date   CHOL  183 01/12/2021   HDL 64 01/12/2021   LDLCALC 104 (H) 01/12/2021   TRIG 82 01/12/2021   CHOLHDL 2.9 01/12/2021   Lab Results  Component Value Date   VD25OH 52.1 01/12/2021   VD25OH 32.9 10/28/2020   VD25OH 64.9 07/01/2020   Lab Results  Component Value Date   WBC 5.4 08/21/2021   HGB 15.6 (H) 08/21/2021   HCT 47.0 (H) 08/21/2021   MCV 92.7 08/21/2021   PLT  214 08/21/2021   Lab Results  Component Value Date   FERRITIN 24 10/04/2020    Attestation Statements:   Reviewed by clinician on day of visit: allergies, medications, problem list, medical history, surgical history, family history, social history, and previous encounter notes.  I, Jesse Sans, FNP, am acting as Energy manager for William Hamburger, NP.  I have reviewed the above documentation for accuracy and completeness, and I agree with the above. -  Tangy Drozdowski d. Kaiah Hosea, NP-C

## 2022-03-08 ENCOUNTER — Encounter (INDEPENDENT_AMBULATORY_CARE_PROVIDER_SITE_OTHER): Payer: Self-pay

## 2022-03-13 NOTE — Progress Notes (Unsigned)
  [facility] [address] [place] [address] Phone: [phone] Subjective:   Bruce Donath, am serving as a scribe for Dr. Antoine Primas.  I'm seeing this patient by the request  of:  Mayer Masker, PA-C  CC: Low back pain follow-up  [id number]  Karen Gross is a 56 y.o. female coming in with complaint of back and neck pain., OMT 01/24/2022. Patient states that her pain has increased since we last saw her. Pain in lumbar spine but denies any radiating. Using heat and IBU.  Patient is having significant difficulty with any significant range of motion at the moment.  Medications patient has been prescribed: Gabapentin, Zanaflex  Taking:         Reviewed prior external information including notes and imaging from previsou exam, outside providers and external EMR if available.   As well as notes that were available from care everywhere and other healthcare systems.  Past medical history, social, surgical and family history all reviewed in electronic medical record.  No pertanent information unless stated regarding to the chief complaint.   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 POSITIVE muscle aches  Objective  Blood pressure 102/62, pulse 67, height 5\' 6"  (1.676 m), weight 176 lb (79.8 kg), SpO2 97 %.   General: No apparent distress alert and oriented x3 mood and affect normal, dressed appropriately.  HEENT: Pupils equal, extraocular movements intact   Respiratory: Patient's speak in full sentences and does not appear short of breath  Cardiovascular: No lower extremity edema, non tender, no erythema  Gait careful, difficulty getting up from a seated position MSK:  Back low back exam does have severe loss of lordosis.  Positive straight leg test on the right side with radicular symptoms at the moment.  Patient has difficulty doing FABER test secondary to pain.  Patient has abnormal voluntary and involuntary guarding to palpation of the lower back as well.  Mild midline tenderness noted as well.    Assessment and Plan:  Lumbar back pain Patient back pain is out of proportion at this moment.  Toradol and Depo-Medrol given today to help with some of the pain.  Held on any otherTo osteopathic manipulation.  Patient does have a mild shuffling gait noted that we will continue to monitor as well. Concern for nerve root impingement or spinal stenosis.  Do feel at this point secondary to the amount of discomfort that we do need to consider the possibility of advanced imaging and an MRI.  Patient could be a candidate for epidurals or nerve root injections if necessary.       The above documentation has been reviewed and is accurate and complete , DO          Note: This dictation was prepared with Dragon dictation along with smaller phrase technology. Any transcriptional errors that result from this process are unintentional.

## 2022-03-14 ENCOUNTER — Ambulatory Visit (INDEPENDENT_AMBULATORY_CARE_PROVIDER_SITE_OTHER): Payer: BC Managed Care – PPO | Admitting: Family Medicine

## 2022-03-14 ENCOUNTER — Encounter: Payer: Self-pay | Admitting: Family Medicine

## 2022-03-14 ENCOUNTER — Ambulatory Visit (INDEPENDENT_AMBULATORY_CARE_PROVIDER_SITE_OTHER): Payer: BC Managed Care – PPO

## 2022-03-14 VITALS — BP 102/62 | HR 67 | Ht 66.0 in | Wt 176.0 lb

## 2022-03-14 DIAGNOSIS — M545 Low back pain, unspecified: Secondary | ICD-10-CM

## 2022-03-14 MED ORDER — METHYLPREDNISOLONE ACETATE 80 MG/ML IJ SUSP
80.0000 mg | Freq: Once | INTRAMUSCULAR | Status: AC
  Administered 2022-03-14: 80 mg via INTRAMUSCULAR

## 2022-03-14 MED ORDER — KETOROLAC TROMETHAMINE 60 MG/2ML IM SOLN
60.0000 mg | Freq: Once | INTRAMUSCULAR | Status: AC
  Administered 2022-03-14: 60 mg via INTRAMUSCULAR

## 2022-03-14 NOTE — Assessment & Plan Note (Signed)
Patient back pain is out of proportion at this moment.  Toradol and Depo-Medrol given today to help with some of the pain.  Held on any otherTo osteopathic manipulation.  Patient does have a mild shuffling gait noted that we will continue to monitor as well. Concern for nerve root impingement or spinal stenosis.  Do feel at this point secondary to the amount of discomfort that we do need to consider the possibility of advanced imaging and an MRI.  Patient could be a candidate for epidurals or nerve root injections if necessary.

## 2022-03-14 NOTE — Patient Instructions (Addendum)
Xray today MRI lumbar [phone] We will be in touch 

## 2022-03-27 ENCOUNTER — Other Ambulatory Visit: Payer: Self-pay | Admitting: Family Medicine

## 2022-04-04 ENCOUNTER — Other Ambulatory Visit: Payer: Self-pay | Admitting: Family Medicine

## 2022-05-01 ENCOUNTER — Other Ambulatory Visit: Payer: Self-pay | Admitting: Family Medicine

## 2022-05-15 ENCOUNTER — Other Ambulatory Visit: Payer: Self-pay | Admitting: Physician Assistant

## 2022-06-02 ENCOUNTER — Other Ambulatory Visit: Payer: Self-pay | Admitting: Family Medicine

## 2022-06-26 ENCOUNTER — Other Ambulatory Visit: Payer: Self-pay | Admitting: Family Medicine

## 2022-07-03 ENCOUNTER — Encounter (INDEPENDENT_AMBULATORY_CARE_PROVIDER_SITE_OTHER): Payer: Self-pay | Admitting: Physician Assistant

## 2022-07-03 ENCOUNTER — Ambulatory Visit (INDEPENDENT_AMBULATORY_CARE_PROVIDER_SITE_OTHER): Payer: BC Managed Care – PPO | Admitting: Physician Assistant

## 2022-07-03 VITALS — BP 107/71 | HR 64 | Temp 97.8°F | Ht 66.0 in | Wt 161.0 lb

## 2022-07-03 DIAGNOSIS — E559 Vitamin D deficiency, unspecified: Secondary | ICD-10-CM | POA: Diagnosis not present

## 2022-07-03 DIAGNOSIS — E669 Obesity, unspecified: Secondary | ICD-10-CM

## 2022-07-03 DIAGNOSIS — Z6826 Body mass index (BMI) 26.0-26.9, adult: Secondary | ICD-10-CM

## 2022-07-03 DIAGNOSIS — F4321 Adjustment disorder with depressed mood: Secondary | ICD-10-CM | POA: Diagnosis not present

## 2022-07-03 MED ORDER — VITAMIN D (ERGOCALCIFEROL) 1.25 MG (50000 UNIT) PO CAPS: 50000.0000 [IU] | ORAL_CAPSULE | ORAL | 0 refills | Status: DC

## 2022-07-03 MED ORDER — ARIPIPRAZOLE 10 MG PO TABS: 10.0000 mg | ORAL_TABLET | Freq: Every day | ORAL | 0 refills | Status: DC

## 2022-07-04 ENCOUNTER — Other Ambulatory Visit: Payer: Self-pay | Admitting: Nurse Practitioner

## 2022-07-04 DIAGNOSIS — E038 Other specified hypothyroidism: Secondary | ICD-10-CM

## 2022-07-04 DIAGNOSIS — E039 Hypothyroidism, unspecified: Secondary | ICD-10-CM

## 2022-07-12 ENCOUNTER — Other Ambulatory Visit: Payer: Self-pay | Admitting: Nurse Practitioner

## 2022-07-12 ENCOUNTER — Telehealth: Payer: Self-pay

## 2022-07-12 DIAGNOSIS — B001 Herpesviral vesicular dermatitis: Secondary | ICD-10-CM

## 2022-07-12 MED ORDER — VALACYCLOVIR HCL 1 G PO TABS: 1000.0000 mg | ORAL_TABLET | Freq: Two times a day (BID) | ORAL | 0 refills | Status: DC

## 2022-07-12 NOTE — Telephone Encounter (Signed)
Please let the patient know that I sent valacyclovir 1000 mg twice daily to [facility]. This is short term treatment for now. We can discuss further options at her upcoming visit.  Thanks so much.   -HB

## 2022-07-12 NOTE — Telephone Encounter (Signed)
Pt is requesting an New Rx for cold sores. Pt is requesting Generic Valtrex.  Pharmacy: [facility] - [place], [place] - [address]    LOV 04/20/21 ROV 07/18/22

## 2022-07-17 NOTE — Progress Notes (Unsigned)
Established patient visit   Patient: Karen Gross   DOB: 09-Jul-1966   56 y.o. Female  MRN: [id number] Visit Date: 07/18/2022   No chief complaint on file.  Subjective    HPI  Follow up  -hypothyroid  -[facility] -will need routine, fasting labs in early 2024.  -?need for recommended vaccines    Medications: Outpatient Medications Prior to Visit  Medication Sig   ARIPiprazole (ABILIFY) 10 MG tablet Take 1 tablet (10 mg total) by mouth daily.   celecoxib (CELEBREX) 200 MG capsule Take 200 mg by mouth 2 (two) times daily.   divalproex (DEPAKOTE) 500 MG DR tablet Take 500 mg by mouth daily.   gabapentin (NEURONTIN) 100 MG capsule TAKE 2 CAPSULES BY MOUTH AT BEDTIME.   levothyroxine (SYNTHROID) 100 MCG tablet TAKE 1 TABLET BY MOUTH ON MONDAYS AND THURSDAYS ONLY, TAKE IN THE MORNING BEFORE BREAKFAST   levothyroxine (SYNTHROID) 112 MCG tablet TAKE 1 TABLET ON SUNDAY, TUESDAY, WEDNESDAY, FRIDAY AND SATURDAY IN THE MORNING BEFORE BREAKFAST   omeprazole (PRILOSEC) 20 MG capsule Take 20 mg by mouth daily.   tiZANidine (ZANAFLEX) 2 MG tablet TAKE 1 TABLET BY MOUTH AT BEDTIME.   topiramate (TOPAMAX) 100 MG tablet Take 1 tablet (100 mg total) by mouth 2 (two) times daily.   valACYclovir (VALTREX) 1000 MG tablet Take 1 tablet (1,000 mg total) by mouth 2 (two) times daily.   Vitamin D, Ergocalciferol, (DRISDOL) 1.25 MG (50000 UNIT) CAPS capsule Take 1 capsule (50,000 Units total) by mouth once a week.   No facility-administered medications prior to visit.    Review of Systems  {Labs (Optional):23779}   Objective    There were no vitals filed for this visit. There is no height or weight on file to calculate BMI.  BP Readings from Last 3 Encounters:  07/03/22 107/71  03/14/22 102/62  02/28/22 (Abnormal) 85/55    Wt Readings from Last 3 Encounters:  07/03/22 161 lb (73 kg)  03/14/22 176 lb (79.8 kg)  02/28/22 174 lb (78.9 kg)    Physical Exam   ***  No results found for any visits on 07/18/22.  Assessment & Plan     Problem List Items Addressed This Visit   None    No follow-ups on file.         Carlean Jews, NP  [facility] [phone] (phone) [phone] (fax)  [facility]

## 2022-07-17 NOTE — Progress Notes (Signed)
Chief Complaint:   OBESITY Karen Gross is here to discuss her progress with her obesity treatment plan along with follow-up of her obesity related diagnoses. Karen Gross is on the Category 2 Plan and keeping a food journal and adhering to recommended goals of 1200 calories and 85+ grams of protein and states she is following her eating plan approximately 80% of the time. Karen Gross states she is exercising 0 minutes 0 times per week.  Today's visit was #: 17 Starting weight: 217 lbs Starting date: 10/01/2019 Today's weight: 161 lbs Today's date: 07/03/2022 Total lbs lost to date: 56 lbs Total lbs lost since last in-office visit: 13  Interim History: Karen Gross has done well with weight loss.  Doing a combination of journaling and weight watchers.  Her last visit was in August 2023 with Karen Gross.  On maintenance since 03/20/2020. Bioimpedance scale shows muscle mass decreased by 16.2 lbs, adipose increased by 4.6 lb.  She notes decreased ability to exercise over the past few months due to back pain.    Subjective:   1. Vitamin D deficiency Karen Gross is currently taking prescription Vit D 50,000 IU once a week. Her last Vit level of 52.1 on 01/12/21- At goal.  Denies any side effects such as nausea, vomiting or muscle weakness.  2. Grief Karen Gross sees a therapist regularly. Taking Abilify--Denies any side effects. We have been refilling this medication for 90 days.  Assessment/Plan:   1. Vitamin D deficiency We will refill Vit D 50K IU once a week for 1 month with 0 refills. Will follow up with level at next visit.  2. Grief We will refill Abilify by mouth daily for 90 days with 0 refills.  3. Obesity current BMI-26.1 Karen Gross is currently in the action stage of change. As such, her goal is to continue with weight loss efforts. She has agreed to the Category 2 Plan and keeping a food journal and adhering to recommended goals of 1200 calories and 85 + grams of protein daily.   Doing well on  maintenance and continues gradual weight loss, but some loss of muscle mass over the past few months and she is unable to exercise much due to back pain.  Exercise goals: All adults should avoid inactivity. Some physical activity is better than none, and adults who participate in any amount of physical activity gain some health benefits.  Behavioral modification strategies: increasing lean protein intake and holiday eating strategies .  Karen Gross has agreed to follow-up with our clinic in 5 months. She was informed of the importance of frequent follow-up visits to maximize her success with intensive lifestyle modifications for her multiple health conditions.   Objective:   Blood pressure 107/71, pulse 64, temperature 97.8 F (36.6 C), height 5\' 6"  (1.676 m), weight 161 lb (73 kg), SpO2 98 %. Body mass index is 25.99 kg/m.  General: Cooperative, alert, well developed, in no acute distress. HEENT: Conjunctivae and lids unremarkable. Cardiovascular: Regular rhythm.  Lungs: Normal work of breathing. Neurologic: No focal deficits.   Lab Results  Component Value Date   CREATININE 0.75 08/21/2021   BUN 19 08/21/2021   NA 141 08/21/2021   K 4.1 08/21/2021   CL 107 08/21/2021   CO2 24 08/21/2021   Lab Results  Component Value Date   ALT 15 08/21/2021   AST 17 08/21/2021   ALKPHOS 76 08/21/2021   BILITOT 0.7 08/21/2021   Lab Results  Component Value Date   HGBA1C 5.0 01/12/2021   HGBA1C 5.1 11/05/2019  HGBA1C 5.3 03/11/2019   HGBA1C 5.3 09/27/2017   HGBA1C 5.2 11/12/2014   Lab Results  Component Value Date   INSULIN 6.5 10/01/2019   Lab Results  Component Value Date   TSH 2.060 01/12/2021   Lab Results  Component Value Date   CHOL 183 01/12/2021   HDL 64 01/12/2021   LDLCALC 104 (H) 01/12/2021   TRIG 82 01/12/2021   CHOLHDL 2.9 01/12/2021   Lab Results  Component Value Date   VD25OH 52.1 01/12/2021   VD25OH 32.9 10/28/2020   VD25OH 64.9 07/01/2020   Lab Results   Component Value Date   WBC 5.4 08/21/2021   HGB 15.6 (H) 08/21/2021   HCT 47.0 (H) 08/21/2021   MCV 92.7 08/21/2021   PLT 214 08/21/2021   Lab Results  Component Value Date   FERRITIN 24 10/04/2020   Attestation Statements:   Reviewed by clinician on day of visit: allergies, medications, problem list, medical history, surgical history, family history, social history, and previous encounter notes.  I, Karen Gross, am acting as transcriptionist for [employer], PA.  I have reviewed the above documentation for accuracy and completeness, and I agree with the above. -  Karen Boyd,PA-C

## 2022-07-18 ENCOUNTER — Ambulatory Visit (INDEPENDENT_AMBULATORY_CARE_PROVIDER_SITE_OTHER): Payer: BC Managed Care – PPO | Admitting: Nurse Practitioner

## 2022-07-18 ENCOUNTER — Encounter: Payer: Self-pay | Admitting: Nurse Practitioner

## 2022-07-18 VITALS — BP 103/68 | HR 59 | Resp 18 | Ht 66.5 in | Wt 170.0 lb

## 2022-07-18 DIAGNOSIS — E039 Hypothyroidism, unspecified: Secondary | ICD-10-CM

## 2022-07-18 DIAGNOSIS — Z23 Encounter for immunization: Secondary | ICD-10-CM | POA: Diagnosis not present

## 2022-07-18 DIAGNOSIS — Z6827 Body mass index (BMI) 27.0-27.9, adult: Secondary | ICD-10-CM

## 2022-07-18 DIAGNOSIS — Z0000 Encounter for general adult medical examination without abnormal findings: Secondary | ICD-10-CM

## 2022-07-18 DIAGNOSIS — E559 Vitamin D deficiency, unspecified: Secondary | ICD-10-CM | POA: Diagnosis not present

## 2022-07-18 DIAGNOSIS — R7301 Impaired fasting glucose: Secondary | ICD-10-CM | POA: Diagnosis not present

## 2022-07-21 ENCOUNTER — Other Ambulatory Visit: Payer: Self-pay | Admitting: Nurse Practitioner

## 2022-07-21 ENCOUNTER — Other Ambulatory Visit: Payer: Self-pay | Admitting: Family Medicine

## 2022-07-28 ENCOUNTER — Other Ambulatory Visit: Payer: Self-pay | Admitting: Obstetrics and Gynecology

## 2022-07-28 DIAGNOSIS — Z1231 Encounter for screening mammogram for malignant neoplasm of breast: Secondary | ICD-10-CM

## 2022-08-01 ENCOUNTER — Other Ambulatory Visit: Payer: BC Managed Care – PPO

## 2022-08-01 DIAGNOSIS — E039 Hypothyroidism, unspecified: Secondary | ICD-10-CM

## 2022-08-01 DIAGNOSIS — Z0000 Encounter for general adult medical examination without abnormal findings: Secondary | ICD-10-CM

## 2022-08-01 DIAGNOSIS — R7301 Impaired fasting glucose: Secondary | ICD-10-CM

## 2022-08-01 DIAGNOSIS — E559 Vitamin D deficiency, unspecified: Secondary | ICD-10-CM

## 2022-08-02 LAB — LIPID PANEL
Chol/HDL Ratio: 3.2 ratio (ref 0.0–4.4)
Cholesterol, Total: 197 mg/dL (ref 100–199)
HDL: 62 mg/dL (ref >39)
LDL Chol Calc (NIH): 122 mg/dL — ABNORMAL HIGH (ref 0–99)
Triglycerides: 69 mg/dL (ref 0–149)
VLDL Cholesterol Cal: 13 mg/dL (ref 5–40)

## 2022-08-02 LAB — COMPREHENSIVE METABOLIC PANEL
ALT: 21 IU/L (ref 0–32)
AST: 18 IU/L (ref 0–40)
Albumin/Globulin Ratio: 2.6 — ABNORMAL HIGH (ref 1.2–2.2)
Albumin: 4.7 g/dL (ref 3.8–4.9)
Alkaline Phosphatase: 87 IU/L (ref 44–121)
BUN/Creatinine Ratio: 28 — ABNORMAL HIGH (ref 9–23)
BUN: 25 mg/dL — ABNORMAL HIGH (ref 6–24)
Bilirubin Total: 0.5 mg/dL (ref 0.0–1.2)
CO2: 21 mmol/L (ref 20–29)
Calcium: 9.5 mg/dL (ref 8.7–10.2)
Chloride: 106 mmol/L (ref 96–106)
Creatinine, Ser: 0.88 mg/dL (ref 0.57–1.00)
Globulin, Total: 1.8 g/dL (ref 1.5–4.5)
Glucose: 81 mg/dL (ref 70–99)
Potassium: 4.4 mmol/L (ref 3.5–5.2)
Sodium: 143 mmol/L (ref 134–144)
Total Protein: 6.5 g/dL (ref 6.0–8.5)
eGFR: 78 mL/min/{1.73_m2} (ref >59)

## 2022-08-02 LAB — CBC
Hematocrit: 48.4 % — ABNORMAL HIGH (ref 34.0–46.6)
Hemoglobin: 16.2 g/dL — ABNORMAL HIGH (ref 11.1–15.9)
MCH: 31.6 pg (ref 26.6–33.0)
MCHC: 33.5 g/dL (ref 31.5–35.7)
MCV: 94 fL (ref 79–97)
Platelets: 224 10*3/uL (ref 150–450)
RBC: 5.13 x10E6/uL (ref 3.77–5.28)
RDW: 12.4 % (ref 11.7–15.4)
WBC: 5 10*3/uL (ref 3.4–10.8)

## 2022-08-02 LAB — HEMOGLOBIN A1C
Est. average glucose Bld gHb Est-mCnc: 100 mg/dL
Hgb A1c MFr Bld: 5.1 % (ref 4.8–5.6)

## 2022-08-02 LAB — T3: T3, Total: 83 ng/dL (ref 71–180)

## 2022-08-02 LAB — VITAMIN D 25 HYDROXY (VIT D DEFICIENCY, FRACTURES): Vit D, 25-Hydroxy: 77.9 ng/mL (ref 30.0–100.0)

## 2022-08-02 LAB — TSH+FREE T4
Free T4: 2.23 ng/dL — ABNORMAL HIGH (ref 0.82–1.77)
TSH: 1.78 u[IU]/mL (ref 0.450–4.500)

## 2022-08-07 ENCOUNTER — Other Ambulatory Visit: Payer: Self-pay | Admitting: Family Medicine

## 2022-08-07 ENCOUNTER — Other Ambulatory Visit: Payer: Self-pay | Admitting: Nurse Practitioner

## 2022-08-28 ENCOUNTER — Other Ambulatory Visit (INDEPENDENT_AMBULATORY_CARE_PROVIDER_SITE_OTHER): Payer: Self-pay | Admitting: Physician Assistant

## 2022-08-29 NOTE — Progress Notes (Deleted)
[place] Phone: [phone] Subjective:    I'm seeing this patient by the request  of:  Lorrene Reid, PA-C  CC:   [id number]  03/14/2022 Patient back pain is out of proportion at this moment.  Toradol and Depo-Medrol given today to help with some of the pain.  Held on any otherTo osteopathic manipulation.  Patient does have a mild shuffling gait noted that we will continue to monitor as well. Concern for nerve root impingement or spinal stenosis.  Do feel at this point secondary to the amount of discomfort that we do need to consider the possibility of advanced imaging and an MRI.  Patient could be a candidate for epidurals or nerve root injections if necessary.  Updated 08/30/2022 Karen Gross is a 57 y.o. female coming in with complaint of back pain  Xray IMPRESSION: Degenerative changes slightly progressed when compared with the prior exam.     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  Tobacco Use   Smoking status: Never   Smokeless tobacco: Never  Vaping Use   Vaping Use: Never used  Substance and Sexual Activity   Alcohol use: Yes    Comment: once a month- wine   Drug use: No   Sexual activity: Yes    Birth control/protection: None  Other Topics Concern   Not on file  Social History Narrative   Not on file   Social Determinants of Health   Financial  Resource Strain: Not on file  Food Insecurity: Not on file  Transportation Needs: Not on file  Physical Activity: Not on file  Stress: Not on file  Social Connections: Not on file   No Known Allergies Family History  Problem Relation Age of Onset   Breast cancer Mother    Heart disease Mother    High blood pressure Mother    Cancer Mother    Depression Mother    Breast cancer Paternal Grandmother    Colon cancer Neg Hx    Stomach cancer Neg Hx    Colon polyps Neg Hx    Esophageal cancer Neg Hx    Rectal cancer Neg Hx     Current Outpatient Medications (Endocrine & Metabolic):    levothyroxine (SYNTHROID) 100 MCG tablet, TAKE 1 TABLET BY MOUTH ON MONDAYS AND THURSDAYS ONLY, TAKE IN THE MORNING BEFORE BREAKFAST   levothyroxine (SYNTHROID) 112 MCG tablet, TAKE 1 TABLET ON SUNDAY, TUESDAY, WEDNESDAY, FRIDAY AND SATURDAY IN THE MORNING BEFORE BREAKFAST    Current Outpatient Medications (Analgesics):    celecoxib (CELEBREX) 200 MG capsule, Take 200 mg by mouth 2 (two) times daily.   Current Outpatient Medications (Other):    ARIPiprazole (ABILIFY) 10 MG tablet, Take 1 tablet (10 mg total) by mouth daily.  divalproex (DEPAKOTE) 500 MG DR tablet, Take 500 mg by mouth daily.   gabapentin (NEURONTIN) 100 MG capsule, TAKE 2 CAPSULES BY MOUTH AT BEDTIME.   omeprazole (PRILOSEC) 20 MG capsule, Take 20 mg by mouth daily.   tiZANidine (ZANAFLEX) 2 MG tablet, TAKE 1 TABLET BY MOUTH AT BEDTIME.   topiramate (TOPAMAX) 100 MG tablet, Take 1 tablet (100 mg total) by mouth 2 (two) times daily.   valACYclovir (VALTREX) 1000 MG tablet, Take 1 tablet (1,000 mg total) by mouth 2 (two) times daily.   Vitamin D, Ergocalciferol, (DRISDOL) 1.25 MG (50000 UNIT) CAPS capsule, Take 1 capsule (50,000 Units total) by mouth once a week.   Reviewed prior external information including notes and imaging from  primary care provider As well as notes that were available from care everywhere and other  healthcare systems.  Past medical history, social, surgical and family history all reviewed in electronic medical record.  No pertanent information unless stated regarding to the chief complaint.   Review of Systems:  No headache, visual changes, nausea, vomiting, diarrhea, constipation, dizziness, abdominal pain, skin rash, fevers, chills, night sweats, weight loss, swollen lymph nodes, body aches, joint swelling, chest pain, shortness of breath, mood changes. POSITIVE muscle aches  Objective  There were no vitals taken for this visit.   General: No apparent distress alert and oriented x3 mood and affect normal, dressed appropriately.  HEENT: Pupils equal, extraocular movements intact  Respiratory: Patient's speak in full sentences and does not appear short of breath  Cardiovascular: No lower extremity edema, non tender, no erythema      Impression and Recommendations:

## 2022-08-30 ENCOUNTER — Ambulatory Visit: Payer: BC Managed Care – PPO | Admitting: Family Medicine

## 2022-09-04 ENCOUNTER — Other Ambulatory Visit: Payer: Self-pay | Admitting: Family Medicine

## 2022-09-19 ENCOUNTER — Ambulatory Visit
Admission: RE | Admit: 2022-09-19 | Discharge: 2022-09-19 | Disposition: A | Discharge status: Home / Self Care | Payer: BC Managed Care – PPO | Source: Ambulatory Visit | Attending: Obstetrics and Gynecology | Admitting: Obstetrics and Gynecology

## 2022-09-19 DIAGNOSIS — Z1231 Encounter for screening mammogram for malignant neoplasm of breast: Secondary | ICD-10-CM

## 2022-09-20 NOTE — Progress Notes (Unsigned)
Karen Gross [address] [place] Phone: [phone] Subjective:   IVilma Gross, am serving as a scribe for Dr. Hulan Saas.  I'm seeing this patient by the request  of:  Abonza, Maritza, PA-C (Inactive)  CC: Upper back pain  [id number]  03/14/2022 Patient back pain is out of proportion at this moment.  Toradol and Depo-Medrol given today to help with some of the pain.  Held on any otherTo osteopathic manipulation.  Patient does have a mild shuffling gait noted that we will continue to monitor as well. Concern for nerve root impingement or spinal stenosis.  Do feel at this point secondary to the amount of discomfort that we do need to consider the possibility of advanced imaging and an MRI.  Patient could be a candidate for epidurals or nerve root injections if necessar     Updated 09/21/2022 Karen Gross is a 57 y.o. female coming in with complaint of back pain. Pain in between her scapula. Hurts in the morning when moving around and getting to school. Hurts with movement.       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  Tobacco Use   Smoking status: Never   Smokeless tobacco: Never  Vaping Use   Vaping Use: Never used  Substance and Sexual Activity   Alcohol use: Yes    Comment: once a month- wine   Drug use: No   Sexual activity: Yes    Birth control/protection: None  Other Topics  Concern   Not on file  Social History Narrative   Not on file   Social Determinants of Health   Financial Resource Strain: Not on file  Food Insecurity: Not on file  Transportation Needs: Not on file  Physical Activity: Not on file  Stress: Not on file  Social Connections: Not on file   No Known Allergies Family History  Problem Relation Age of Onset   Breast cancer Mother    Heart disease Mother    High blood pressure Mother    Cancer Mother    Depression Mother    Breast cancer Paternal Grandmother    Colon cancer Neg Hx    Stomach cancer Neg Hx    Colon polyps Neg Hx    Esophageal cancer Neg Hx    Rectal cancer Neg Hx     Current Outpatient Medications (Endocrine & Metabolic):    levothyroxine (SYNTHROID) 100 MCG tablet, TAKE 1 TABLET BY MOUTH ON MONDAYS AND THURSDAYS ONLY, TAKE IN THE MORNING BEFORE BREAKFAST   levothyroxine (SYNTHROID) 112 MCG tablet, TAKE 1 TABLET ON SUNDAY, TUESDAY, WEDNESDAY, FRIDAY AND SATURDAY IN THE MORNING BEFORE BREAKFAST    Current Outpatient Medications (Analgesics):    meloxicam (MOBIC) 15 MG tablet, Take 1 tablet (15 mg total) by mouth  daily.   celecoxib (CELEBREX) 200 MG capsule, Take 200 mg by mouth 2 (two) times daily.   Current Outpatient Medications (Other):    ARIPiprazole (ABILIFY) 10 MG tablet, Take 1 tablet (10 mg total) by mouth daily.   divalproex (DEPAKOTE) 500 MG DR tablet, Take 500 mg by mouth daily.   gabapentin (NEURONTIN) 100 MG capsule, TAKE 2 CAPSULES BY MOUTH AT BEDTIME.   omeprazole (PRILOSEC) 20 MG capsule, Take 20 mg by mouth daily.   tiZANidine (ZANAFLEX) 2 MG tablet, TAKE 1 TABLET BY MOUTH AT BEDTIME.   topiramate (TOPAMAX) 100 MG tablet, Take 1 tablet (100 mg total) by mouth 2 (two) times daily.   valACYclovir (VALTREX) 1000 MG tablet, Take 1 tablet (1,000 mg total) by mouth 2 (two) times daily.   Vitamin D, Ergocalciferol, (DRISDOL) 1.25 MG (50000 UNIT) CAPS capsule, Take 1 capsule (50,000 Units total) by  mouth once a week.   Reviewed prior external information including notes and imaging from  primary care provider As well as notes that were available from care everywhere and other healthcare systems.  Past medical history, social, surgical and family history all reviewed in electronic medical record.  No pertanent information unless stated regarding to the chief complaint.   Review of Systems:  No headache, visual changes, nausea, vomiting, diarrhea, constipation, dizziness, abdominal pain, skin rash, fevers, chills, night sweats, weight loss, swollen lymph nodes, body aches, joint swelling, chest pain, shortness of breath, mood changes. POSITIVE muscle aches  Objective  Blood pressure 110/62, pulse 77, height 5' 6"$  (1.676 m), weight 168 lb (76.2 kg), SpO2 98 %.   General: No apparent distress alert and oriented x3 mood and affect normal, dressed appropriately.  HEENT: Pupils equal, extraocular movements intact  Respiratory: Patient's speak in full sentences and does not appear short of breath  Cardiovascular: No lower extremity edema, non tender, no erythema  Upper back has tenderness noted more in the T7-T8 area on the right side.  Some scapular dyskinesis noted.   Osteopathic findings C2 flexed rotated and side bent right T7 extended rotated and side bent right inhaled third rib T9 extended rotated and side bent left  [id number]; 15 additional minutes spent for Therapeutic exercises as stated in above notes.  This included exercises focusing on stretching, strengthening, with significant focus on eccentric aspects.   Long term goals include an improvement in range of motion, strength, endurance as well as avoiding reinjury. Patient's frequency would include in 1-2 times a day, 3-5 times a week for a duration of 6-12 weeks. Exercises that included:  Basic scapular stabilization to include adduction and depression of scapula Scaption, focusing on proper movement and good control Internal  and External rotation utilizing a theraband, with elbow tucked at side entire time Rows with theraband    Proper technique shown and discussed handout in great detail with ATC.  All questions were discussed and answered.     Impression and Recommendations:    The above documentation has been reviewed and is accurate and complete Lyndal Pulley, DO

## 2022-09-21 ENCOUNTER — Ambulatory Visit (INDEPENDENT_AMBULATORY_CARE_PROVIDER_SITE_OTHER): Payer: BC Managed Care – PPO

## 2022-09-21 ENCOUNTER — Ambulatory Visit: Payer: BC Managed Care – PPO | Admitting: Family Medicine

## 2022-09-21 ENCOUNTER — Encounter: Payer: Self-pay | Admitting: Family Medicine

## 2022-09-21 VITALS — BP 110/62 | HR 77 | Ht 66.0 in | Wt 168.0 lb

## 2022-09-21 DIAGNOSIS — M9908 Segmental and somatic dysfunction of rib cage: Secondary | ICD-10-CM

## 2022-09-21 DIAGNOSIS — G2589 Other specified extrapyramidal and movement disorders: Secondary | ICD-10-CM | POA: Diagnosis not present

## 2022-09-21 DIAGNOSIS — M9901 Segmental and somatic dysfunction of cervical region: Secondary | ICD-10-CM

## 2022-09-21 DIAGNOSIS — M9902 Segmental and somatic dysfunction of thoracic region: Secondary | ICD-10-CM | POA: Diagnosis not present

## 2022-09-21 MED ORDER — MELOXICAM 15 MG PO TABS: 15.0000 mg | ORAL_TABLET | Freq: Every day | ORAL | 0 refills | Status: DC

## 2022-09-21 NOTE — Patient Instructions (Addendum)
Do prescribed exercises at least 3x a week Xrays today Meloxicam 64m for 10 days See you again in 6-8 weeks

## 2022-09-21 NOTE — Assessment & Plan Note (Signed)
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  Discussed icing regimen and home exercises, which activities to do and which ones to avoid.  Increase activity slowly.  Get chest x-ray to make sure there is no other bony abnormality that could be contributing.  No shortness of breath, no chest pain.  Patient states that it is only hurting her with repetitive activity.  Differential includes a potentially subscapular bursitis but highly unlikely.  Follow-up with me again in 6 to 8 weeks.

## 2022-09-21 NOTE — Assessment & Plan Note (Signed)
   Decision today to treat with OMT was based on Physical Exam  After verbal consent patient was treated with HVLA, ME, FPR techniques in cervical, thoracic, rib areas, all areas are chronic   Patient tolerated the procedure well with improvement in symptoms  Patient given exercises, stretches and lifestyle modifications  See medications in patient instructions if given  Patient will follow up in 4-8 weeks 

## 2022-09-25 ENCOUNTER — Ambulatory Visit: Payer: BC Managed Care – PPO | Admitting: Family Medicine

## 2022-10-04 ENCOUNTER — Other Ambulatory Visit: Payer: Self-pay | Admitting: Nurse Practitioner

## 2022-10-04 ENCOUNTER — Other Ambulatory Visit: Payer: Self-pay | Admitting: Family Medicine

## 2022-10-04 DIAGNOSIS — E038 Other specified hypothyroidism: Secondary | ICD-10-CM

## 2022-10-04 DIAGNOSIS — E039 Hypothyroidism, unspecified: Secondary | ICD-10-CM

## 2022-10-11 ENCOUNTER — Ambulatory Visit: Payer: BC Managed Care – PPO | Admitting: Family Medicine

## 2022-11-13 ENCOUNTER — Other Ambulatory Visit: Payer: Self-pay | Admitting: Family Medicine

## 2022-12-04 ENCOUNTER — Encounter (INDEPENDENT_AMBULATORY_CARE_PROVIDER_SITE_OTHER): Payer: Self-pay | Admitting: Adult Health

## 2022-12-04 ENCOUNTER — Ambulatory Visit (INDEPENDENT_AMBULATORY_CARE_PROVIDER_SITE_OTHER): Payer: BC Managed Care – PPO | Admitting: Adult Health

## 2022-12-04 VITALS — BP 95/61 | HR 62 | Temp 97.8°F | Ht 66.0 in | Wt 160.0 lb

## 2022-12-04 DIAGNOSIS — N6489 Other specified disorders of breast: Secondary | ICD-10-CM | POA: Diagnosis not present

## 2022-12-04 DIAGNOSIS — E039 Hypothyroidism, unspecified: Secondary | ICD-10-CM | POA: Diagnosis not present

## 2022-12-04 DIAGNOSIS — Z6825 Body mass index (BMI) 25.0-25.9, adult: Secondary | ICD-10-CM

## 2022-12-04 DIAGNOSIS — E669 Obesity, unspecified: Secondary | ICD-10-CM

## 2022-12-04 DIAGNOSIS — E559 Vitamin D deficiency, unspecified: Secondary | ICD-10-CM

## 2022-12-04 DIAGNOSIS — R7301 Impaired fasting glucose: Secondary | ICD-10-CM | POA: Diagnosis not present

## 2022-12-04 NOTE — Progress Notes (Signed)
WEIGHT SUMMARY AND BIOMETRICS  Vitals Temp: 97.8 F (36.6 C) BP: 95/61 Pulse Rate: 62 SpO2: 100 %   Anthropometric Measurements Height: 5\' 6"  (1.676 m) Weight: 160 lb (72.6 kg) BMI (Calculated): 25.84 Weight at Last Visit: 161 lb Weight Lost Since Last Visit: 1 lb Starting Weight: 217 lb Total Weight Loss (lbs): 56 lb (25.4 kg)   Body Composition  Body Fat %: 35.6 % Fat Mass (lbs): 57 lbs Muscle Mass (lbs): 98 lbs Total Body Water (lbs): 69.4 lbs Visceral Fat Rating : 7   Other Clinical Data Fasting: Yes Labs: No Today's Visit #: 18 Starting Date: 10/01/19    Chief Complaint:   OBESITY Karen Gross is here to discuss her progress with her obesity treatment plan. She is on the the Category 2 Plan + 100 and states she is following her eating plan approximately 80 % of the time. She states she is not currently exercising due to chronic thoracic and lumbar back pain.   Interim History:  Ms. Karen Gross has been in Mx Phase at [facility] since 03/11/2020 Her start weight was 217 lbs, current weight today 160 lbs!  Reviewed Bioimpedance Results with pt: Muscle Mass:+ 2.6 lbs Adipose Mass: - 4.2 lbs  She has established with new PCP at [facility]- Vincent Gros, NP-C  Subjective:   1. Vitamin D deficiency Her Psychiatrist ha taken over prescribing Ergocalciferol    Latest Reference Range & Units 08/01/22 08:27  Vitamin D, 25-Hydroxy 30.0 - 100.0 ng/mL 77.9    2. Bilateral pendulous breasts Ms. Flinn reports chronic thoracic and lumbar back pain. She is treated by Sports Med and she will walk as often as she can. Her current cup size- 38DD She is agreeable to referral to Plastics  3. Impaired fasting glucose She is not on any antidiabetic medications at present.  4. Hypothyroidism, unspecified type She endorses stable energy levels. PCP manages Levothyroxine-   levothyroxine (SYNTHROID) 100 MCG tablet TAKE 1 TABLET BY MOUTH ON MONDAYS AND THURSDAYS ONLY,  TAKE IN THE MORNING BEFORE BREAKFAST Dispense: 12 tablet, Refills: 3 ordered   08/07/2022 -- Carlean Jews, NP           []   levothyroxine (SYNTHROID) 112 MCG tablet TAKE 1 TABLET ON SUNDAY, TUESDAY, WEDNESDAY, FRIDAY AND SATURDAY IN THE MORNING BEFORE BREAKFAST. Dispense: 65 tablet, Refills: 1 ordered              Assessment/Plan:   1. Vitamin D deficiency Check Labs - VITAMIN D 25 Hydroxy (Vit-D Deficiency, Fractures)  2. Bilateral pendulous breasts Referral- - Ambulatory referral to Plastic Surgery  3. Impaired fasting glucose Check Labs - COMPLETE METABOLIC PANEL WITH GFR - Insulin, random - Hemoglobin A1c  4. Hypothyroidism, unspecified type Check Labs - TSH + free T4  5. Obesity current BMI-25.9  [name] is currently in the action stage of change. As such, her goal is to maintain weight for now. She has agreed to the Category 2 Plan. +100  Exercise goals: For substantial health benefits, adults should do at least 150 minutes (2 hours and 30 minutes) a week of moderate-intensity, or 75 minutes (1 hour and 15 minutes) a week of vigorous-intensity aerobic physical activity, or an equivalent combination of moderate- and vigorous-intensity aerobic activity. Aerobic activity should be performed in episodes of at least 10 minutes, and preferably, it should be spread throughout the week.  Behavioral modification strategies: increasing lean protein intake, decreasing simple carbohydrates, increasing vegetables, increasing water intake, no skipping meals,  meal planning and cooking strategies, and planning for success.  Blakeleigh has agreed to follow-up with our clinic in 5 months. She was informed of the importance of frequent follow-up visits to maximize her success with intensive lifestyle modifications for her multiple health conditions.   Nicolly was informed we would discuss her lab results at her next visit unless there is a critical issue that needs to be addressed sooner.  Bradi agreed to keep her next visit at the agreed upon time to discuss these results.  Objective:   Blood pressure 95/61, pulse 62, temperature 97.8 F (36.6 C), height 5\' 6"  (1.676 m), weight 160 lb (72.6 kg), SpO2 100 %. Body mass index is 25.82 kg/m.  General: Cooperative, alert, well developed, in no acute distress. HEENT: Conjunctivae and lids unremarkable. Cardiovascular: Regular rhythm.  Lungs: Normal work of breathing. Neurologic: No focal deficits.   Lab Results  Component Value Date   CREATININE 0.88 08/01/2022   BUN 25 (H) 08/01/2022   NA 143 08/01/2022   K 4.4 08/01/2022   CL 106 08/01/2022   CO2 21 08/01/2022   Lab Results  Component Value Date   ALT 21 08/01/2022   AST 18 08/01/2022   ALKPHOS 87 08/01/2022   BILITOT 0.5 08/01/2022   Lab Results  Component Value Date   HGBA1C 5.1 08/01/2022   HGBA1C 5.0 01/12/2021   HGBA1C 5.1 11/05/2019   HGBA1C 5.3 03/11/2019   HGBA1C 5.3 09/27/2017   Lab Results  Component Value Date   INSULIN 6.5 10/01/2019   Lab Results  Component Value Date   TSH 1.780 08/01/2022   Lab Results  Component Value Date   CHOL 197 08/01/2022   HDL 62 08/01/2022   LDLCALC 122 (H) 08/01/2022   TRIG 69 08/01/2022   CHOLHDL 3.2 08/01/2022   Lab Results  Component Value Date   VD25OH 77.9 08/01/2022   VD25OH 52.1 01/12/2021   VD25OH 32.9 10/28/2020   Lab Results  Component Value Date   WBC 5.0 08/01/2022   HGB 16.2 (H) 08/01/2022   HCT 48.4 (H) 08/01/2022   MCV 94 08/01/2022   PLT 224 08/01/2022   Lab Results  Component Value Date   FERRITIN 24 10/04/2020    Attestation Statements:   Reviewed by clinician on day of visit: allergies, medications, problem list, medical history, surgical history, family history, social history, and previous encounter notes.  I have reviewed the above documentation for accuracy and completeness, and I agree with the above. -  Jeannett Dekoning d. Nakiea Metzner, NP-C

## 2022-12-05 LAB — COMPREHENSIVE METABOLIC PANEL
ALT: 17 IU/L (ref 0–32)
AST: 14 IU/L (ref 0–40)
Albumin/Globulin Ratio: 2.3 — ABNORMAL HIGH (ref 1.2–2.2)
Albumin: 4.3 g/dL (ref 3.8–4.9)
Alkaline Phosphatase: 86 IU/L (ref 44–121)
BUN/Creatinine Ratio: 25 — ABNORMAL HIGH (ref 9–23)
BUN: 23 mg/dL (ref 6–24)
Bilirubin Total: 0.4 mg/dL (ref 0.0–1.2)
CO2: 22 mmol/L (ref 20–29)
Calcium: 9.6 mg/dL (ref 8.7–10.2)
Chloride: 109 mmol/L — ABNORMAL HIGH (ref 96–106)
Creatinine, Ser: 0.93 mg/dL (ref 0.57–1.00)
Globulin, Total: 1.9 g/dL (ref 1.5–4.5)
Glucose: 93 mg/dL (ref 70–99)
Potassium: 4.4 mmol/L (ref 3.5–5.2)
Sodium: 144 mmol/L (ref 134–144)
Total Protein: 6.2 g/dL (ref 6.0–8.5)
eGFR: 73 mL/min/{1.73_m2} (ref >59)

## 2022-12-05 LAB — HEMOGLOBIN A1C
Est. average glucose Bld gHb Est-mCnc: 100 mg/dL
Hgb A1c MFr Bld: 5.1 % (ref 4.8–5.6)

## 2022-12-05 LAB — TSH+FREE T4
Free T4: 2.11 ng/dL — ABNORMAL HIGH (ref 0.82–1.77)
TSH: 0.934 u[IU]/mL (ref 0.450–4.500)

## 2022-12-05 LAB — INSULIN, RANDOM: INSULIN: 5.8 u[IU]/mL (ref 2.6–24.9)

## 2022-12-05 LAB — VITAMIN D 25 HYDROXY (VIT D DEFICIENCY, FRACTURES): Vit D, 25-Hydroxy: 63.9 ng/mL (ref 30.0–100.0)

## 2022-12-14 ENCOUNTER — Other Ambulatory Visit: Payer: Self-pay | Admitting: Family Medicine

## 2023-01-10 ENCOUNTER — Other Ambulatory Visit: Payer: Self-pay | Admitting: Family Medicine

## 2023-01-15 ENCOUNTER — Other Ambulatory Visit: Payer: Self-pay | Admitting: Family Medicine

## 2023-01-18 ENCOUNTER — Encounter: Payer: Self-pay | Admitting: Family Medicine

## 2023-01-18 ENCOUNTER — Encounter: Payer: BC Managed Care – PPO | Admitting: Nurse Practitioner

## 2023-01-18 ENCOUNTER — Ambulatory Visit (INDEPENDENT_AMBULATORY_CARE_PROVIDER_SITE_OTHER): Payer: BC Managed Care – PPO | Admitting: Family Medicine

## 2023-01-18 VITALS — BP 98/64 | HR 62 | Resp 18 | Ht 66.0 in | Wt 166.0 lb

## 2023-01-18 DIAGNOSIS — Z1159 Encounter for screening for other viral diseases: Secondary | ICD-10-CM

## 2023-01-18 DIAGNOSIS — Z6826 Body mass index (BMI) 26.0-26.9, adult: Secondary | ICD-10-CM

## 2023-01-18 DIAGNOSIS — E663 Overweight: Secondary | ICD-10-CM

## 2023-01-18 DIAGNOSIS — E038 Other specified hypothyroidism: Secondary | ICD-10-CM

## 2023-01-18 DIAGNOSIS — Z0000 Encounter for general adult medical examination without abnormal findings: Secondary | ICD-10-CM

## 2023-01-18 DIAGNOSIS — B001 Herpesviral vesicular dermatitis: Secondary | ICD-10-CM

## 2023-01-18 MED ORDER — VALACYCLOVIR HCL 1 G PO TABS
1000.0000 mg | ORAL_TABLET | Freq: Two times a day (BID) | ORAL | 0 refills | Status: DC
Start: 2023-01-18 — End: 2023-08-22

## 2023-01-18 NOTE — Progress Notes (Signed)
Complete physical exam  Patient: Karen Gross   DOB: 1966/06/01   57 y.o. Female  MRN: [id number]  Subjective:    Chief Complaint  Patient presents with   Annual Exam    Karen Gross is a 57 y.o. female who presents today for a complete physical exam. She reports consuming a  high-protein, low-sugar  diet.  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  She generally feels well. She reports sleeping well. She does not have additional problems to discuss today, but she needs a refill of valacyclovir.    Most recent fall risk assessment:    01/18/2023    9:39 AM  Fall Risk   Falls in the past year? 0  Number falls in past yr: 0  Injury with Fall? 0  Risk for fall due to : No Fall Risks  Follow up Falls evaluation completed     Most recent depression and anxiety screenings:    01/18/2023    9:39 AM 04/20/2021    4:32 PM  PHQ 2/9 Scores  PHQ - 2 Score 0 0  PHQ- 9 Score 0 0      04/20/2021    4:32 PM 01/12/2021   10:03 AM  GAD 7 : Generalized Anxiety Score  Nervous, Anxious, on Edge 0 0  Control/stop worrying 0 0  Worry too much - different things 0 0  Trouble relaxing 0 0  Restless 0 0  Easily annoyed or irritable 0 0  Afraid - awful might happen 0 0  Total GAD 7 Score 0 0    Patient Active Problem List   Diagnosis Date Noted   Scapular dyskinesis 09/21/2022   Somatic dysfunction of spine, thoracic 09/21/2022   Lumbar back pain 10/20/2021   Somatic dysfunction of spine, lumbar 10/20/2021   Pure hypercholesterolemia 01/20/2021   Vitamin D deficiency 12/10/2019   Depression, recurrent (HCC) 09/13/2018   Hx of partial seizures 09/13/2018   AV block, 1st degree 08/14/2018   Perimenopausal disorder 06/29/2017   Seizure disorder (HCC) 06/29/2017   Gastroesophageal reflux disease 12/27/2016   Overweight with body mass index (BMI) of 26 to 26.9 in adult 12/27/2016   Lichen planus 03/22/2016    Hypothyroidism 10/23/2015   Migraine 10/23/2015    Past Surgical History:  Procedure Laterality Date   ABDOMINAL HYSTERECTOMY     KNEE ARTHROSCOPY Right    TOTAL ABDOMINAL HYSTERECTOMY     Social History   Tobacco Use   Smoking status: Never    Passive exposure: Never   Smokeless tobacco: Never  Vaping Use   Vaping Use: Never used  Substance Use Topics   Alcohol use: Yes    Comment: once a month- wine   Drug use: No   Family History  Problem Relation Age of Onset   Breast cancer Mother    Heart disease Mother    High blood pressure Mother    Cancer Mother    Depression Mother    Breast cancer Paternal Grandmother    Colon cancer Neg Hx    Stomach cancer Neg Hx    Colon polyps Neg Hx    Esophageal cancer Neg Hx    Rectal cancer Neg Hx    No Known Allergies   Patient Care Team: Melida Quitter, PA as PCP - General (Family Medicine) Nita Sells, MD (Dermatology) Edwinna Areola, DO (Obstetrics and Gynecology)   Outpatient Medications Prior to  Visit  Medication Sig   ARIPiprazole (ABILIFY) 10 MG tablet Take 10 mg by mouth every morning.   celecoxib (CELEBREX) 200 MG capsule Take 200 mg by mouth 2 (two) times daily.   divalproex (DEPAKOTE) 500 MG DR tablet Take 500 mg by mouth daily.   gabapentin (NEURONTIN) 100 MG capsule TAKE 2 CAPSULES BY MOUTH AT BEDTIME.   levothyroxine (SYNTHROID) 100 MCG tablet TAKE 1 TABLET BY MOUTH ON MONDAYS AND THURSDAYS ONLY, TAKE IN THE MORNING BEFORE BREAKFAST   levothyroxine (SYNTHROID) 112 MCG tablet TAKE 1 TABLET ON SUNDAY, TUESDAY, WEDNESDAY, FRIDAY AND SATURDAY IN THE MORNING BEFORE BREAKFAST.   meloxicam (MOBIC) 15 MG tablet Take 1 tablet (15 mg total) by mouth daily.   omeprazole (PRILOSEC) 20 MG capsule Take 20 mg by mouth daily.   tiZANidine (ZANAFLEX) 2 MG tablet TAKE 1 TABLET BY MOUTH AT BEDTIME.   Vitamin D, Ergocalciferol, (DRISDOL) 1.25 MG (50000 UNIT) CAPS capsule Take 1 capsule (50,000 Units total) by mouth once a  week.   [DISCONTINUED] valACYclovir (VALTREX) 1000 MG tablet Take 1 tablet (1,000 mg total) by mouth 2 (two) times daily.   No facility-administered medications prior to visit.    Review of Systems  Constitutional:  Negative for chills, fever and malaise/fatigue.  HENT:  Negative for congestion and hearing loss.   Eyes:  Negative for blurred vision and double vision.  Respiratory:  Negative for cough and shortness of breath.   Cardiovascular:  Negative for chest pain, palpitations and leg swelling.  Gastrointestinal:  Negative for abdominal pain, constipation, diarrhea and heartburn.  Genitourinary:  Negative for frequency and urgency.  Musculoskeletal:  Negative for myalgias and neck pain.  Neurological:  Negative for headaches.  Psychiatric/Behavioral:  Negative for depression. The patient is not nervous/anxious and does not have insomnia.       Objective:    BP 98/64 (BP Location: Left Arm, Patient Position: Sitting, Cuff Size: Normal)   Pulse 62   Resp 18   Ht 5\' 6"  (1.676 m)   Wt 166 lb (75.3 kg)   SpO2 100%   BMI 26.79 kg/m    Physical Exam Constitutional:      General: She is not in acute distress.    Appearance: Normal appearance.  HENT:     Head: Normocephalic and atraumatic.     Right Ear: Tympanic membrane, ear canal and external ear normal.     Left Ear: Tympanic membrane, ear canal and external ear normal.     Ears:     Comments: A lot of soft wax in both ears, can still see to TM    Nose: Nose normal.     Mouth/Throat:     Mouth: Mucous membranes are moist.     Pharynx: No oropharyngeal exudate or posterior oropharyngeal erythema.  Eyes:     Extraocular Movements: Extraocular movements intact.     Conjunctiva/sclera: Conjunctivae normal.     Pupils: Pupils are equal, round, and reactive to light.  Neck:     Thyroid: No thyroid mass, thyromegaly or thyroid tenderness.  Cardiovascular:     Rate and Rhythm: Normal rate and regular rhythm.     Heart  sounds: Normal heart sounds. No murmur heard.    No friction rub. No gallop.  Pulmonary:     Effort: Pulmonary effort is normal. No respiratory distress.     Breath sounds: Normal breath sounds. No wheezing, rhonchi or rales.  Abdominal:     General: Abdomen is flat. Bowel sounds are  normal.     Palpations: There is no mass.     Tenderness: There is no abdominal tenderness.  Musculoskeletal:        General: Normal range of motion.     Cervical back: Normal range of motion and neck supple.  Lymphadenopathy:     Cervical: No cervical adenopathy.  Skin:    General: Skin is warm and dry.  Neurological:     Mental Status: She is alert and oriented to person, place, and time.     Cranial Nerves: No cranial nerve deficit.     Motor: No weakness.     Deep Tendon Reflexes: Reflexes normal.  Psychiatric:        Mood and Affect: Mood normal.        Assessment & Plan:    Routine Health Maintenance and Physical Exam  Immunization History  Administered Date(s) Administered   Influenza Inj Mdck Quad Pf 04/23/2019   Influenza,inj,Quad PF,6+ Mos 04/20/2021, 07/18/2022   Influenza-Unspecified 05/09/2017, 04/30/2018, 06/10/2020   PFIZER(Purple Top)SARS-COV-2 Vaccination 09/25/2019, 10/17/2019, 07/19/2020   Tdap 12/19/2010, 04/20/2021    Health Maintenance  Topic Date Due   Hepatitis C Screening  Never done   Zoster Vaccines- Shingrix (1 of 2) Never done   COVID-19 Vaccine (4 - 2023-24 season) 03/31/2022   INFLUENZA VACCINE  03/01/2023   MAMMOGRAM  09/19/2024   Colonoscopy  12/07/2029   DTaP/Tdap/Td (3 - Td or Tdap) 04/21/2031   HIV Screening  Completed   HPV VACCINES  Aged Out   Will return for fasting lab appointment within the next week.  She is agreeable to shingles vaccine at her follow-up in about 6 months.  Discussed health benefits of physical activity, and encouraged her to engage in regular exercise appropriate for her age and condition.  Wellness examination -     CBC  with Differential/Platelet; Future -     Comprehensive metabolic panel; Future -     Hemoglobin A1c; Future -     Lipid panel; Future -     VITAMIN D 25 Hydroxy (Vit-D Deficiency, Fractures); Future -     TSH; Future -     T4, free; Future  Other specified hypothyroidism Assessment & Plan: Checking thyroid function with fasting lab work.  Continue levothyroxine 100 mcg on Mondays and Thursdays, levothyroxine 112 mcg on Sundays/Tuesdays/Wednesdays/Fridays/Saturdays.  Will continue to monitor and adjust as necessary.  Orders: -     TSH; Future -     T4, free; Future  Overweight with body mass index (BMI) of 26 to 26.9 in adult Assessment & Plan: [facility] with [facility].  Orders: -     CBC with Differential/Platelet; Future -     Comprehensive metabolic panel; Future -     Hemoglobin A1c; Future -     Lipid panel; Future -     VITAMIN D 25 Hydroxy (Vit-D Deficiency, Fractures); Future -     TSH; Future -     T4, free; Future  Screening for viral disease -     Hepatitis C antibody; Future  Fever blister -     valACYclovir HCl; Take 1 tablet (1,000 mg total) by mouth 2 (two) times daily.  Dispense: 20 tablet; Refill: 0    Return in about 6 months (around 07/20/2023) for follow-up for hypothyroidism and first shingles shot, fasting blood work 1 week before.     Melida Quitter, PA

## 2023-01-18 NOTE — Patient Instructions (Signed)
Things to do to keep yourself healthy! - Exercise at least 30-45 minutes a day, 3-4 days a week.  - Eat a low-fat diet with lots of fruits and vegetables, up to 7-9 servings per day.  - Seatbelts can save your life. Wear them always.  - Smoke detectors on every level of your home, check batteries every year.  - Eye Doctor: have an eye exam every 1-2 years, even if you do not wear glasses or contacts. - Safe sex: if you may be exposed to STDs, use a condom.  - Alcohol: If you drink, do it moderately, less than 2 drinks per day.  - Health Care Power of Attorney: choose someone to speak for you if you are not able.  - Depression and anxiety are common in our stressful world. If you're feeling stressed, down, or like you're losing interest in things you normally enjoy, please come in for a visit.  - Violence: If anyone is threatening or hurting you, please call immediately.  Everyone deserves to be safe and loved in all of the relationships.

## 2023-01-18 NOTE — Assessment & Plan Note (Signed)
Checking thyroid function with fasting lab work.  Continue levothyroxine 100 mcg on Mondays and Thursdays, levothyroxine 112 mcg on Sundays/Tuesdays/Wednesdays/Fridays/Saturdays.  Will continue to monitor and adjust as necessary.

## 2023-01-18 NOTE — Assessment & Plan Note (Signed)
Continue working with [facility].

## 2023-01-24 ENCOUNTER — Other Ambulatory Visit: Payer: BC Managed Care – PPO

## 2023-01-24 DIAGNOSIS — Z6826 Body mass index (BMI) 26.0-26.9, adult: Secondary | ICD-10-CM

## 2023-01-24 DIAGNOSIS — Z0000 Encounter for general adult medical examination without abnormal findings: Secondary | ICD-10-CM

## 2023-01-24 DIAGNOSIS — Z1159 Encounter for screening for other viral diseases: Secondary | ICD-10-CM

## 2023-01-24 DIAGNOSIS — E038 Other specified hypothyroidism: Secondary | ICD-10-CM

## 2023-01-25 ENCOUNTER — Other Ambulatory Visit: Payer: Self-pay | Admitting: Family Medicine

## 2023-01-25 DIAGNOSIS — E038 Other specified hypothyroidism: Secondary | ICD-10-CM

## 2023-01-25 LAB — CBC WITH DIFFERENTIAL/PLATELET
Basophils Absolute: 0 10*3/uL (ref 0.0–0.2)
Basos: 1 %
EOS (ABSOLUTE): 0.1 10*3/uL (ref 0.0–0.4)
Eos: 2 %
Hematocrit: 46.7 % — ABNORMAL HIGH (ref 34.0–46.6)
Hemoglobin: 15.5 g/dL (ref 11.1–15.9)
Immature Grans (Abs): 0 10*3/uL (ref 0.0–0.1)
Immature Granulocytes: 0 %
Lymphocytes Absolute: 1.7 10*3/uL (ref 0.7–3.1)
Lymphs: 38 %
MCH: 31.1 pg (ref 26.6–33.0)
MCHC: 33.2 g/dL (ref 31.5–35.7)
MCV: 94 fL (ref 79–97)
Monocytes Absolute: 0.3 10*3/uL (ref 0.1–0.9)
Monocytes: 7 %
Neutrophils Absolute: 2.4 10*3/uL (ref 1.4–7.0)
Neutrophils: 52 %
Platelets: 202 10*3/uL (ref 150–450)
RBC: 4.99 x10E6/uL (ref 3.77–5.28)
RDW: 12.3 % (ref 11.7–15.4)
WBC: 4.5 10*3/uL (ref 3.4–10.8)

## 2023-01-25 LAB — LIPID PANEL
Chol/HDL Ratio: 2.7 ratio (ref 0.0–4.4)
Cholesterol, Total: 196 mg/dL (ref 100–199)
HDL: 72 mg/dL (ref >39)
LDL Chol Calc (NIH): 108 mg/dL — ABNORMAL HIGH (ref 0–99)
Triglycerides: 87 mg/dL (ref 0–149)
VLDL Cholesterol Cal: 16 mg/dL (ref 5–40)

## 2023-01-25 LAB — COMPREHENSIVE METABOLIC PANEL
ALT: 21 IU/L (ref 0–32)
AST: 17 IU/L (ref 0–40)
Albumin: 4.4 g/dL (ref 3.8–4.9)
Alkaline Phosphatase: 92 IU/L (ref 44–121)
BUN/Creatinine Ratio: 13 (ref 9–23)
BUN: 12 mg/dL (ref 6–24)
Bilirubin Total: 0.4 mg/dL (ref 0.0–1.2)
CO2: 21 mmol/L (ref 20–29)
Calcium: 9.5 mg/dL (ref 8.7–10.2)
Chloride: 108 mmol/L — ABNORMAL HIGH (ref 96–106)
Creatinine, Ser: 0.92 mg/dL (ref 0.57–1.00)
Globulin, Total: 2 g/dL (ref 1.5–4.5)
Glucose: 82 mg/dL (ref 70–99)
Potassium: 4.1 mmol/L (ref 3.5–5.2)
Sodium: 144 mmol/L (ref 134–144)
Total Protein: 6.4 g/dL (ref 6.0–8.5)
eGFR: 74 mL/min/{1.73_m2} (ref >59)

## 2023-01-25 LAB — HEPATITIS C ANTIBODY: Hep C Virus Ab: NONREACTIVE

## 2023-01-25 LAB — T4, FREE: Free T4: 1.77 ng/dL (ref 0.82–1.77)

## 2023-01-25 LAB — TSH: TSH: 10.3 u[IU]/mL — ABNORMAL HIGH (ref 0.450–4.500)

## 2023-01-25 LAB — VITAMIN D 25 HYDROXY (VIT D DEFICIENCY, FRACTURES): Vit D, 25-Hydroxy: 68.9 ng/mL (ref 30.0–100.0)

## 2023-01-25 LAB — HEMOGLOBIN A1C
Est. average glucose Bld gHb Est-mCnc: 97 mg/dL
Hgb A1c MFr Bld: 5 % (ref 4.8–5.6)

## 2023-02-12 ENCOUNTER — Other Ambulatory Visit: Payer: Self-pay | Admitting: Family Medicine

## 2023-02-22 ENCOUNTER — Other Ambulatory Visit: Payer: BC Managed Care – PPO

## 2023-02-22 DIAGNOSIS — E038 Other specified hypothyroidism: Secondary | ICD-10-CM

## 2023-03-06 ENCOUNTER — Encounter: Payer: Self-pay | Admitting: Plastic Surgery

## 2023-03-06 ENCOUNTER — Ambulatory Visit: Payer: BC Managed Care – PPO | Admitting: Plastic Surgery

## 2023-03-06 VITALS — BP 102/66 | HR 64 | Ht 66.5 in | Wt 166.6 lb

## 2023-03-06 DIAGNOSIS — M545 Low back pain, unspecified: Secondary | ICD-10-CM | POA: Diagnosis not present

## 2023-03-06 DIAGNOSIS — Z6826 Body mass index (BMI) 26.0-26.9, adult: Secondary | ICD-10-CM

## 2023-03-06 DIAGNOSIS — G8929 Other chronic pain: Secondary | ICD-10-CM

## 2023-03-06 DIAGNOSIS — M542 Cervicalgia: Secondary | ICD-10-CM | POA: Diagnosis not present

## 2023-03-06 DIAGNOSIS — N62 Hypertrophy of breast: Secondary | ICD-10-CM | POA: Diagnosis not present

## 2023-03-06 DIAGNOSIS — G40909 Epilepsy, unspecified, not intractable, without status epilepticus: Secondary | ICD-10-CM

## 2023-03-06 DIAGNOSIS — M546 Pain in thoracic spine: Secondary | ICD-10-CM

## 2023-03-06 NOTE — Progress Notes (Signed)
Patient ID: Karen Gross, female    DOB: September 22, 1965, 57 y.o.   MRN: [id number]   Chief Complaint  Patient presents with   Consult   Breast Problem    Mammary Hyperplasia: The patient is a 58 y.o. female with a history of mammary hyperplasia for several years.  She has extremely large breasts causing symptoms that include the following: Back pain in the upper and lower back, including neck pain. She pulls or pins her bra straps to provide better lift and relief of the pressure and pain. She notices relief by holding her breast up manually.  Her shoulder straps cause grooves and pain and pressure that requires padding for relief. Pain medication is sometimes required with motrin and tylenol.  Activities that are hindered by enlarged breasts include: exercise and running.  She has tried supportive clothing as well as fitted bras without improvement.  Her breasts are extremely large and fairly symmetric.  She has hyperpigmentation of the inframammary area on both sides.  The sternal to nipple distance on the right is 30 cm and the left is 30 cm. She is 5 feet 6 inches tall and weighs 166 pounds.  The BMI = 26.8 kg/m.  Preoperative bra size = DD cup. She would like to be a B cup. The estimated excess breast tissue to be removed at the time of surgery = 450-500 grams on the left and 450-500 grams on the right.  Mammogram history: 08/2022.  Family history of breast cancer:  mother.  Tobacco use:  none.   The patient expresses the desire to pursue surgical intervention. Patient has been going to the [facility] and lost 50 pounds over the past year.       Review of Systems  Constitutional:  Positive for activity change. Negative for appetite change.  HENT: Negative.    Eyes: Negative.   Respiratory: Negative.    Cardiovascular: Negative.   Gastrointestinal: Negative.   Endocrine: Negative.   Genitourinary: Negative.   Musculoskeletal: Negative.     Past Medical History:   Diagnosis Date   Anxiety    Bartholin cyst    GERD (gastroesophageal reflux disease)    Hip pain    Hypothyroidism    Knee pain    Seizures (HCC)    Stress Induced- last seizure 5 + years ago     Past Surgical History:  Procedure Laterality Date   ABDOMINAL HYSTERECTOMY     KNEE ARTHROSCOPY Right    TOTAL ABDOMINAL HYSTERECTOMY        Current Outpatient Medications:    ARIPiprazole (ABILIFY) 10 MG tablet, Take 10 mg by mouth every morning., Disp: , Rfl:    celecoxib (CELEBREX) 200 MG capsule, Take 200 mg by mouth 2 (two) times daily., Disp: , Rfl:    divalproex (DEPAKOTE) 500 MG DR tablet, Take 500 mg by mouth daily., Disp: , Rfl:    gabapentin (NEURONTIN) 100 MG capsule, TAKE 2 CAPSULES BY MOUTH AT BEDTIME., Disp: 60 capsule, Rfl: 3   levothyroxine (SYNTHROID) 100 MCG tablet, TAKE 1 TABLET BY MOUTH ON MONDAYS AND THURSDAYS ONLY, TAKE IN THE MORNING BEFORE BREAKFAST, Disp: 12 tablet, Rfl: 3   levothyroxine (SYNTHROID) 112 MCG tablet, TAKE 1 TABLET ON SUNDAY, TUESDAY, WEDNESDAY, FRIDAY AND SATURDAY IN THE MORNING BEFORE BREAKFAST., Disp: 65 tablet, Rfl: 1   meloxicam (MOBIC) 15 MG tablet, Take 1 tablet (15 mg total) by mouth daily., Disp: 10 tablet, Rfl: 0   omeprazole (PRILOSEC) 20  MG capsule, Take 20 mg by mouth daily., Disp: , Rfl:    tiZANidine (ZANAFLEX) 2 MG tablet, TAKE 1 TABLET BY MOUTH AT BEDTIME., Disp: 30 tablet, Rfl: 0   valACYclovir (VALTREX) 1000 MG tablet, Take 1 tablet (1,000 mg total) by mouth 2 (two) times daily., Disp: 20 tablet, Rfl: 0   Vitamin D, Ergocalciferol, (DRISDOL) 1.25 MG (50000 UNIT) CAPS capsule, Take 1 capsule (50,000 Units total) by mouth once a week., Disp: 5 capsule, Rfl: 0   Objective:   Vitals:   03/06/23 1352  BP: 102/66  Pulse: 64  SpO2: 97%    Physical Exam Vitals and nursing note reviewed.  Constitutional:      Appearance: Normal appearance.  HENT:     Head: Atraumatic.  Cardiovascular:     Rate and Rhythm: Normal rate.      Pulses: Normal pulses.  Pulmonary:     Effort: Pulmonary effort is normal.  Abdominal:     General: There is no distension.     Palpations: Abdomen is soft.  Musculoskeletal:        General: No swelling or deformity.  Skin:    General: Skin is warm.     Capillary Refill: Capillary refill takes less than 2 seconds.     Coloration: Skin is not jaundiced.  Neurological:     Mental Status: She is alert and oriented to person, place, and time.  Psychiatric:        Mood and Affect: Mood normal.        Behavior: Behavior normal.        Thought Content: Thought content normal.        Judgment: Judgment normal.     Assessment & Plan:  Symptomatic mammary hypertrophy  Seizure disorder (HCC)  Chronic bilateral thoracic back pain  The procedure the patient selected and that was best for the patient was discussed. The risk were discussed and include but not limited to the following:  Breast asymmetry, fluid accumulation, firmness of the breast, inability to breast feed, loss of nipple or areola, skin loss, change in skin and nipple sensation, fat necrosis of the breast tissue, bleeding, infection and healing delay.  There are risks of anesthesia and injury to nerves or blood vessels.  Allergic reaction to tape, suture and skin glue are possible.  There will be swelling.  Any of these can lead to the need for revisional surgery which is not included in this surgery.  A breast reduction has potential to interfere with diagnostic procedures in the future.  This procedure is best done when the breast is fully developed.  Changes in the breast will continue to occur over time: pregnancy, weight gain or weigh loss. No guarantees are given for a certain bra or breast size.    Total time: 40 minutes. This includes time spent with the patient during the visit as well as time spent before and after the visit reviewing the chart, documenting the encounter, ordering pertinent studies and literature for the  patient.   Physical therapy:  ordered Mammogram:  done [facility]:  Currently seeing The patient is a good candidate for bilateral breast reduction.  She will come and see [place] after she gets finished with physical therapy.   Pictures were obtained of the patient and placed in the chart with the patient's or guardian's permission.   Alena Bills Bianka Liberati, DO

## 2023-03-15 ENCOUNTER — Other Ambulatory Visit: Payer: Self-pay | Admitting: Family Medicine

## 2023-04-10 ENCOUNTER — Other Ambulatory Visit: Payer: Self-pay | Admitting: Family Medicine

## 2023-04-10 DIAGNOSIS — E039 Hypothyroidism, unspecified: Secondary | ICD-10-CM

## 2023-04-10 DIAGNOSIS — E038 Other specified hypothyroidism: Secondary | ICD-10-CM

## 2023-04-19 ENCOUNTER — Other Ambulatory Visit: Payer: Self-pay | Admitting: Family Medicine

## 2023-04-30 NOTE — Progress Notes (Deleted)
   Referring Provider Melida Quitter, PA [address] [place],  [place] [address]   CC: No chief complaint on file.     Karen Gross is an 57 y.o. female.  HPI: Patient is a 57 y.o. year old female here for follow up after completing physical therapy for pain related to macromastia.  She was seen for initial consult by Dr. Ulice Bold on 03/06/2023.  At that time, complained of chronic upper back and neck discomfort in the context of large breasts.  STN 30 cm each breast.  BMI equals 26.8 kg/m.  Preoperative bra size equals DD cup.  She expressed that she would like to be a B cup postoperatively.  Estimated excess position wound at time of surgery close 450 to 500 g each side.  Mammogram 08/2022.  Discussed completion of PT prior to submitting for authorization.  Today,  Review of Systems General: Denies fevers MSK: Endorses ongoing back and neck discomfort Skin: Denies rashes ***  Physical Exam    03/06/2023    1:52 PM 01/18/2023    9:29 AM 12/04/2022    7:00 AM  Vitals with BMI  Height 5' 6.5" 5\' 6"  5\' 6"   Weight 166 lbs 10 oz 166 lbs 160 lbs  BMI 26.49 26.81 25.84  Systolic 102 98 95  Diastolic 66 64 61  Pulse 64 62 62    General:  No acute distress,  Alert and oriented, Non-Toxic, Normal speech and affect Psych: Normal behavior and mood Respiratory: No increased WOB MSK: Ambulatory  Assessment/Plan ***  Patient is interested in pursuing surgical intervention for bilateral breast reduction. 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  Discussed with patient we would submit to insurance for authorization, discussed approval could take up to 6 weeks.   Karen Gross 04/30/2023, 4:16 PM

## 2023-05-01 ENCOUNTER — Ambulatory Visit: Payer: BC Managed Care – PPO | Admitting: Physician Assistant

## 2023-05-02 ENCOUNTER — Other Ambulatory Visit: Payer: Self-pay | Admitting: Nurse Practitioner

## 2023-05-02 NOTE — Telephone Encounter (Signed)
This one also has you as PCP. Will you send this for her?

## 2023-05-07 ENCOUNTER — Ambulatory Visit (INDEPENDENT_AMBULATORY_CARE_PROVIDER_SITE_OTHER): Payer: BC Managed Care – PPO | Admitting: Adult Health

## 2023-05-07 ENCOUNTER — Encounter (INDEPENDENT_AMBULATORY_CARE_PROVIDER_SITE_OTHER): Payer: Self-pay | Admitting: Adult Health

## 2023-05-07 VITALS — BP 87/57 | HR 59 | Temp 97.8°F | Ht 66.0 in | Wt 162.0 lb

## 2023-05-07 DIAGNOSIS — E669 Obesity, unspecified: Secondary | ICD-10-CM

## 2023-05-07 DIAGNOSIS — E038 Other specified hypothyroidism: Secondary | ICD-10-CM

## 2023-05-07 DIAGNOSIS — Z6835 Body mass index (BMI) 35.0-35.9, adult: Secondary | ICD-10-CM

## 2023-05-07 DIAGNOSIS — E559 Vitamin D deficiency, unspecified: Secondary | ICD-10-CM

## 2023-05-07 DIAGNOSIS — Z6826 Body mass index (BMI) 26.0-26.9, adult: Secondary | ICD-10-CM | POA: Diagnosis not present

## 2023-05-07 NOTE — Progress Notes (Signed)
WEIGHT SUMMARY AND BIOMETRICS  Vitals Temp: 97.8 F (36.6 C) BP: (!) 87/57 Pulse Rate: (!) 59 SpO2: 99 %   Anthropometric Measurements Height: 5\' 6"  (1.676 m) Weight: 162 lb (73.5 kg) BMI (Calculated): 26.16 Weight at Last Visit: 160lb Weight Lost Since Last Visit: 0 Weight Gained Since Last Visit: 2lb Starting Weight: 217lb Total Weight Loss (lbs): 54 lb (24.5 kg)   Body Composition  Body Fat %: 34.5 % Fat Mass (lbs): 56 lbs Muscle Mass (lbs): 101.2 lbs Total Body Water (lbs): 67.4 lbs Visceral Fat Rating : 7   Other Clinical Data Fasting: yes Labs: no Today's Visit #: 18 Starting Date: 10/01/19    Chief Complaint:   OBESITY Karen Gross is here to discuss her progress with her obesity treatment plan. She is on the the Category 2 Plan +100 and states she is following her eating plan approximately 70 % of the time. She states she is exercising Brisk Walking daily.   Interim History:  She started [facility] 10/01/2019, weight 217 lbs with correspondong BMI 35 Started Mx Phase on 03/11/2020, weight 185 lbs with corresponding BMI 29 She was first seen every 3 months, now has been elongated to f/u every 5 months. Today weight 162 lbs with corresponding BMI 26.2 Visceral Adipose Rating at 7- at goal  She established with new PCP/Morgan Edstrom , PA-C on 01/18/2023  Reviewed Bioimpedance results with pt: Muscle Mass: +3.2 lbs Adipose Mass: -1 lb   Happy Family Updates- Her spouse, Karen Gross, will retire this year Her son is NOW PLAYING for the [employer], will finish [employer] via online format Her daughter is thriving in her Senior year, on the [employer]!  Subjective:   1. Vitamin D deficiency  Latest Reference Range & Units 01/12/21 10:18 08/01/22 08:27 12/04/22 08:05 01/24/23 09:22  Vitamin D, 25-Hydroxy 30.0 - 100.0 ng/mL 52.1 77.9 63.9 68.9   HWW initially started Ergocalciferol, now managed by her Psychiatrist.  2. Other specified hypothyroidism  Latest  Reference Range & Units 12/04/22 08:05 01/24/23 09:22 02/22/23 08:41  TSH 0.450 - 4.500 uIU/mL 0.934 10.300 (H) 1.890  Triiodothyronine,Free,Serum 2.0 - 4.4 pg/mL   2.8  T4,Free(Direct) 0.82 - 1.77 ng/dL 9.60 (H) 4.54 0.98 (H)  (H): Data is abnormally high  PCP now manages Levothyroxine therapy- Monday and Thursday Tuesday, Wednesday, Friday, Saturday, and Sunday  She endorses stable energy levels.  Assessment/Plan:   1. Vitamin D deficiency Continue Ergocalciferol per Psychiatrist  2. Other specified hypothyroidism Continue Levothyroxine per PCP dosing schedule  3. Obesity current BMI-26.16  [name] is currently in the action stage of change. As such, her goal is to maintain weight for now. She has agreed to the Category 2 Plan. +100  Exercise goals: For substantial health benefits, adults should do at least 150 minutes (2 hours and 30 minutes) a week of moderate-intensity, or 75 minutes (1 hour and 15 minutes) a week of vigorous-intensity aerobic physical activity, or an equivalent combination of moderate- and vigorous-intensity aerobic activity. Aerobic activity should be performed in episodes of at least 10 minutes, and preferably, it should be spread throughout the week.  Behavioral modification strategies: increasing lean protein intake, decreasing simple carbohydrates, increasing vegetables, increasing water intake, no skipping meals, meal planning and cooking strategies, keeping healthy foods in the home, better snacking choices, and planning for success.  Maurine has agreed to follow-up with our clinic in 5 months She was informed of the importance of frequent follow-up visits to maximize her success  with intensive lifestyle modifications for her multiple health conditions.   Objective:   Blood pressure (!) 87/57, pulse (!) 59, temperature 97.8 F (36.6 C), height 5\' 6"  (1.676 m), weight 162 lb (73.5 kg), SpO2 99%. Body mass index is 26.15 kg/m.  General:  Cooperative, alert, well developed, in no acute distress. HEENT: Conjunctivae and lids unremarkable. Cardiovascular: Regular rhythm.  Lungs: Normal work of breathing. Neurologic: No focal deficits.   Lab Results  Component Value Date   CREATININE 0.92 01/24/2023   BUN 12 01/24/2023   NA 144 01/24/2023   K 4.1 01/24/2023   CL 108 (H) 01/24/2023   CO2 21 01/24/2023   Lab Results  Component Value Date   ALT 21 01/24/2023   AST 17 01/24/2023   ALKPHOS 92 01/24/2023   BILITOT 0.4 01/24/2023   Lab Results  Component Value Date   HGBA1C 5.0 01/24/2023   HGBA1C 5.1 12/04/2022   HGBA1C 5.1 08/01/2022   HGBA1C 5.0 01/12/2021   HGBA1C 5.1 11/05/2019   Lab Results  Component Value Date   INSULIN 5.8 12/04/2022   INSULIN 6.5 10/01/2019   Lab Results  Component Value Date   TSH 1.890 02/22/2023   Lab Results  Component Value Date   CHOL 196 01/24/2023   HDL 72 01/24/2023   LDLCALC 108 (H) 01/24/2023   TRIG 87 01/24/2023   CHOLHDL 2.7 01/24/2023   Lab Results  Component Value Date   VD25OH 68.9 01/24/2023   VD25OH 63.9 12/04/2022   VD25OH 77.9 08/01/2022   Lab Results  Component Value Date   WBC 4.5 01/24/2023   HGB 15.5 01/24/2023   HCT 46.7 (H) 01/24/2023   MCV 94 01/24/2023   PLT 202 01/24/2023   Lab Results  Component Value Date   FERRITIN 24 10/04/2020    Attestation Statements:   Reviewed by clinician on day of visit: allergies, medications, problem list, medical history, surgical history, family history, social history, and previous encounter notes.  Time spent on visit including pre-visit chart review and post-visit care and charting was 28 minutes.   I have reviewed the above documentation for accuracy and completeness, and I agree with the above. -  Deshanti Adcox d. Leven Hoel, NP-C

## 2023-05-11 NOTE — Progress Notes (Signed)
   Referring Provider Melida Quitter, PA [address] [place],  [place] [address]   CC:  Chief Complaint  Patient presents with   Follow-up      Karen Gross is an 57 y.o. female.  HPI: Patient is a 57 y.o. year old female here for follow up after completing physical therapy for pain related to macromastia.  She was seen for initial consult by Dr. Ulice Bold on 03/06/2023.  At that time, complained of chronic upper back and neck discomfort in the context of large breasts.  STN 30 cm each breast.  BMI equals 26.8 kg/m.  Preoperative bra size equals DD cup.  She expressed that she would like to be a B cup postoperatively.  Estimated excess position wound at time of surgery close 450 to 500 g each side.  Mammogram 08/2022.  Discussed completion of PT prior to submitting for authorization.  Today, patient reports that she completed 6 weeks of physical therapy without any considerable improvement in her chronic upper back and neck discomfort.  She states that her back pain is the primary motivator for her to move forward with breast reduction surgery.  Additionally, she endorses inframammary rashes as well as certain close not fitting appropriately.  She reports that some exercises are hindered due to her breast size.  She is eagerly hopeful to move forward with surgery for relief of her macromastia.   Review of Systems General: Denies fevers MSK: Endorses ongoing back and neck discomfort Skin: Endorses intermittent inframammary rashes  Physical Exam    05/14/2023    2:03 PM 05/07/2023    7:00 AM 03/06/2023    1:52 PM  Vitals with BMI  Height 5' 6.5" 5\' 6"  5' 6.5"  Weight 172 lbs 162 lbs 166 lbs 10 oz  BMI 27.35 26.16 26.49  Systolic 104 87 102  Diastolic 69 57 66  Pulse 66 59 64    General:  No acute distress,  Alert and oriented, Non-Toxic, Normal speech and affect Psych: Normal behavior and mood Respiratory: No increased WOB MSK:  Ambulatory  Assessment/Plan  Macromastia:  Patient is interested in pursuing surgical intervention for bilateral breast reduction. 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  Discussed with patient we would submit to insurance for authorization, discussed approval could take up to 6 weeks.   Karen Gross 05/14/2023, 2:22 PM

## 2023-05-14 ENCOUNTER — Ambulatory Visit: Payer: BC Managed Care – PPO | Admitting: Physician Assistant

## 2023-05-14 ENCOUNTER — Encounter: Payer: Self-pay | Admitting: Physician Assistant

## 2023-05-14 VITALS — BP 104/69 | HR 66 | Ht 66.5 in | Wt 172.0 lb

## 2023-05-14 DIAGNOSIS — M542 Cervicalgia: Secondary | ICD-10-CM

## 2023-05-14 DIAGNOSIS — M546 Pain in thoracic spine: Secondary | ICD-10-CM | POA: Diagnosis not present

## 2023-05-14 DIAGNOSIS — N62 Hypertrophy of breast: Secondary | ICD-10-CM | POA: Diagnosis not present

## 2023-05-14 DIAGNOSIS — Z6827 Body mass index (BMI) 27.0-27.9, adult: Secondary | ICD-10-CM | POA: Diagnosis not present

## 2023-05-25 ENCOUNTER — Other Ambulatory Visit: Payer: Self-pay | Admitting: Family Medicine

## 2023-05-28 ENCOUNTER — Encounter: Payer: Self-pay | Admitting: Plastic Surgery

## 2023-06-05 ENCOUNTER — Other Ambulatory Visit: Payer: Self-pay | Admitting: Family Medicine

## 2023-06-18 ENCOUNTER — Other Ambulatory Visit: Payer: Self-pay | Admitting: Family Medicine

## 2023-06-27 ENCOUNTER — Encounter: Payer: BC Managed Care – PPO | Admitting: Student

## 2023-06-27 ENCOUNTER — Telehealth: Payer: Self-pay | Admitting: Plastic Surgery

## 2023-06-27 NOTE — Telephone Encounter (Signed)
Ptn called is upset insurance has denied x 2 --- told her we will research why they are denying and have heather contact [employer] on Monday - wants surgery before year end.

## 2023-07-04 ENCOUNTER — Other Ambulatory Visit: Payer: Self-pay | Admitting: Family Medicine

## 2023-07-04 DIAGNOSIS — Z0000 Encounter for general adult medical examination without abnormal findings: Secondary | ICD-10-CM

## 2023-07-04 DIAGNOSIS — E038 Other specified hypothyroidism: Secondary | ICD-10-CM

## 2023-07-04 DIAGNOSIS — E7849 Other hyperlipidemia: Secondary | ICD-10-CM

## 2023-07-04 DIAGNOSIS — E7800 Pure hypercholesterolemia, unspecified: Secondary | ICD-10-CM

## 2023-07-18 ENCOUNTER — Encounter: Payer: Self-pay | Admitting: Plastic Surgery

## 2023-07-18 ENCOUNTER — Ambulatory Visit (HOSPITAL_BASED_OUTPATIENT_CLINIC_OR_DEPARTMENT_OTHER): Admit: 2023-07-18 | Payer: BC Managed Care – PPO | Admitting: Plastic Surgery

## 2023-07-18 ENCOUNTER — Encounter (HOSPITAL_BASED_OUTPATIENT_CLINIC_OR_DEPARTMENT_OTHER): Payer: Self-pay

## 2023-07-18 SURGERY — BREAST REDUCTION WITH LIPOSUCTION
Anesthesia: Choice | Laterality: Bilateral

## 2023-07-23 ENCOUNTER — Other Ambulatory Visit: Payer: Self-pay | Admitting: Family Medicine

## 2023-07-23 ENCOUNTER — Other Ambulatory Visit: Payer: BC Managed Care – PPO

## 2023-07-23 DIAGNOSIS — E038 Other specified hypothyroidism: Secondary | ICD-10-CM

## 2023-07-23 DIAGNOSIS — Z0000 Encounter for general adult medical examination without abnormal findings: Secondary | ICD-10-CM

## 2023-07-23 DIAGNOSIS — E7800 Pure hypercholesterolemia, unspecified: Secondary | ICD-10-CM

## 2023-07-24 LAB — CBC WITH DIFFERENTIAL/PLATELET
Basophils Absolute: 0 10*3/uL (ref 0.0–0.2)
Basos: 1 %
EOS (ABSOLUTE): 0.1 10*3/uL (ref 0.0–0.4)
Eos: 3 %
Hematocrit: 47.6 % — ABNORMAL HIGH (ref 34.0–46.6)
Hemoglobin: 15.7 g/dL (ref 11.1–15.9)
Immature Grans (Abs): 0 10*3/uL (ref 0.0–0.1)
Immature Granulocytes: 0 %
Lymphocytes Absolute: 1.6 10*3/uL (ref 0.7–3.1)
Lymphs: 37 %
MCH: 31.1 pg (ref 26.6–33.0)
MCHC: 33 g/dL (ref 31.5–35.7)
MCV: 94 fL (ref 79–97)
Monocytes Absolute: 0.3 10*3/uL (ref 0.1–0.9)
Monocytes: 8 %
Neutrophils Absolute: 2.3 10*3/uL (ref 1.4–7.0)
Neutrophils: 51 %
Platelets: 220 10*3/uL (ref 150–450)
RBC: 5.05 x10E6/uL (ref 3.77–5.28)
RDW: 12.5 % (ref 11.7–15.4)
WBC: 4.3 10*3/uL (ref 3.4–10.8)

## 2023-07-24 LAB — COMPREHENSIVE METABOLIC PANEL
ALT: 12 [IU]/L (ref 0–32)
AST: 16 [IU]/L (ref 0–40)
Albumin: 4.5 g/dL (ref 3.8–4.9)
Alkaline Phosphatase: 88 [IU]/L (ref 44–121)
BUN/Creatinine Ratio: 23 (ref 9–23)
BUN: 19 mg/dL (ref 6–24)
Bilirubin Total: 0.3 mg/dL (ref 0.0–1.2)
CO2: 23 mmol/L (ref 20–29)
Calcium: 9.2 mg/dL (ref 8.7–10.2)
Chloride: 109 mmol/L — ABNORMAL HIGH (ref 96–106)
Creatinine, Ser: 0.82 mg/dL (ref 0.57–1.00)
Globulin, Total: 2 g/dL (ref 1.5–4.5)
Glucose: 87 mg/dL (ref 70–99)
Potassium: 4.4 mmol/L (ref 3.5–5.2)
Sodium: 145 mmol/L — ABNORMAL HIGH (ref 134–144)
Total Protein: 6.5 g/dL (ref 6.0–8.5)
eGFR: 84 mL/min/{1.73_m2} (ref >59)

## 2023-07-24 LAB — LIPID PANEL
Chol/HDL Ratio: 2.7 {ratio} (ref 0.0–4.4)
Cholesterol, Total: 197 mg/dL (ref 100–199)
HDL: 72 mg/dL (ref >39)
LDL Chol Calc (NIH): 117 mg/dL — ABNORMAL HIGH (ref 0–99)
Triglycerides: 41 mg/dL (ref 0–149)
VLDL Cholesterol Cal: 8 mg/dL (ref 5–40)

## 2023-07-24 LAB — HEMOGLOBIN A1C
Est. average glucose Bld gHb Est-mCnc: 103 mg/dL
Hgb A1c MFr Bld: 5.2 % (ref 4.8–5.6)

## 2023-07-24 LAB — TSH RFX ON ABNORMAL TO FREE T4: TSH: 3.44 u[IU]/mL (ref 0.450–4.500)

## 2023-07-27 ENCOUNTER — Encounter: Payer: BC Managed Care – PPO | Admitting: Plastic Surgery

## 2023-07-30 ENCOUNTER — Ambulatory Visit: Payer: BC Managed Care – PPO | Admitting: Family Medicine

## 2023-07-30 ENCOUNTER — Encounter: Payer: Self-pay | Admitting: Family Medicine

## 2023-07-30 VITALS — BP 105/70 | HR 64 | Temp 98.0°F | Ht 66.5 in | Wt 172.0 lb

## 2023-07-30 DIAGNOSIS — E038 Other specified hypothyroidism: Secondary | ICD-10-CM | POA: Diagnosis not present

## 2023-07-30 DIAGNOSIS — H0289 Other specified disorders of eyelid: Secondary | ICD-10-CM

## 2023-07-30 DIAGNOSIS — H02843 Edema of right eye, unspecified eyelid: Secondary | ICD-10-CM | POA: Diagnosis not present

## 2023-07-30 MED ORDER — LEVOTHYROXINE SODIUM 112 MCG PO TABS: ORAL_TABLET | ORAL | 1 refills | Status: DC

## 2023-07-30 MED ORDER — LEVOTHYROXINE SODIUM 100 MCG PO TABS: ORAL_TABLET | ORAL | 3 refills | Status: DC

## 2023-07-30 NOTE — Patient Instructions (Signed)
EYE SORENESS: Apply a warm, wet cloth (warm compress) to your eye for 5-10 minutes, 4 to 6 times a day.  SHINGLES SHOT: Since you are not feeling 100%, we will wait to do the first dose of shingles.  We can do the first at a nurse visit in the next 1-2 months and the second at your annual physical.

## 2023-07-30 NOTE — Assessment & Plan Note (Signed)
TSH within normal limits.  Continue levothyroxine 100 mcg on Mondays and Thursdays, levothyroxine 112 mcg on Sundays/Tuesdays/Wednesdays/Fridays/Saturdays.  Will continue to monitor.

## 2023-07-30 NOTE — Progress Notes (Signed)
Established Patient Office Visit  Subjective   Patient ID: LEDRA BECERRIL, female    DOB: 11/26/1965  Age: 57 y.o. MRN: [id number]  Chief Complaint  Patient presents with   Follow-up    HPI Karen Gross is a 57 y.o. female presenting today for follow up of hypothyroidism. Taking levothyroxine regularly in the AM away from food and vitamins.  Current routine includes levothyroxine 100 mcg on Mondays and Thursdays, levothyroxine 112 mcg on Sundays/Tuesdays/Wednesdays/Fridays/Saturdays. Denies fatigue, weight changes, heat/cold intolerance, skin/hair changes, bowel changes, CVS symptoms.  She does endorse that her right eye has been sore since waking up today.  Denies eye discharge, changes in vision, headache, dizziness, ear pain or discharge.  She does have a mild URI that started about 2 days ago.  Outpatient Medications Prior to Visit  Medication Sig   ARIPiprazole (ABILIFY) 10 MG tablet Take 10 mg by mouth every morning.   celecoxib (CELEBREX) 200 MG capsule Take 200 mg by mouth 2 (two) times daily.   divalproex (DEPAKOTE) 500 MG DR tablet Take 500 mg by mouth daily.   gabapentin (NEURONTIN) 100 MG capsule TAKE 2 CAPSULES BY MOUTH AT BEDTIME.   meloxicam (MOBIC) 15 MG tablet Take 1 tablet (15 mg total) by mouth daily.   omeprazole (PRILOSEC) 20 MG capsule Take 20 mg by mouth daily.   tiZANidine (ZANAFLEX) 2 MG tablet TAKE 1 TABLET BY MOUTH AT BEDTIME.   valACYclovir (VALTREX) 1000 MG tablet Take 1 tablet (1,000 mg total) by mouth 2 (two) times daily. (Patient taking differently: Take 1,000 mg by mouth 2 (two) times daily as needed.)   Vitamin D, Ergocalciferol, (DRISDOL) 1.25 MG (50000 UNIT) CAPS capsule Take 1 capsule (50,000 Units total) by mouth once a week.   [DISCONTINUED] levothyroxine (SYNTHROID) 100 MCG tablet TAKE 1 TABLET BY MOUTH ON MONDAYS AND THURSDAYS ONLY, TAKE IN THE MORNING BEFORE BREAKFAST   [DISCONTINUED] levothyroxine (SYNTHROID) 112 MCG tablet TAKE 1 TABLET  ON SUNDAY, TUESDAY, WEDNESDAY, FRIDAY AND SATURDAY IN THE MORNING BEFORE BREAKFAST.   No facility-administered medications prior to visit.    ROS Negative unless otherwise noted in HPI   Objective:     BP 105/70   Pulse 64   Temp 98 F (36.7 C) (Temporal)   Ht 5' 6.5" (1.689 m)   Wt 172 lb (78 kg)   SpO2 100%   BMI 27.35 kg/m   Physical Exam Constitutional:      General: She is not in acute distress.    Appearance: Normal appearance.  HENT:     Head: Normocephalic and atraumatic.  Eyes:     General: Lids are normal.        Right eye: No foreign body or discharge.        Left eye: No foreign body or discharge.     Extraocular Movements: Extraocular movements intact.     Right eye: Normal extraocular motion and no nystagmus.     Left eye: Normal extraocular motion and no nystagmus.     Conjunctiva/sclera: Conjunctivae normal.     Pupils: Pupils are equal, round, and reactive to light.      Comments: Mild TTP along lateral and inferior right eyelid  Cardiovascular:     Rate and Rhythm: Normal rate and regular rhythm.     Heart sounds: No murmur heard.    No friction rub. No gallop.  Pulmonary:     Effort: Pulmonary effort is normal. No respiratory distress.     Breath sounds:  No wheezing, rhonchi or rales.  Skin:    General: Skin is warm and dry.  Neurological:     Mental Status: She is alert and oriented to person, place, and time.     Assessment & Plan:  Other specified hypothyroidism Assessment & Plan: TSH within normal limits.  Continue levothyroxine 100 mcg on Mondays and Thursdays, levothyroxine 112 mcg on Sundays/Tuesdays/Wednesdays/Fridays/Saturdays.  Will continue to monitor.  Orders: -     Levothyroxine Sodium; TAKE 1 TABLET BY MOUTH ON MONDAYS AND THURSDAYS ONLY, TAKE IN THE MORNING BEFORE BREAKFAST  Dispense: 12 tablet; Refill: 3 -     Levothyroxine Sodium; TAKE 1 TABLET ON SUNDAY, TUESDAY, WEDNESDAY, FRIDAY AND SATURDAY IN THE MORNING BEFORE  BREAKFAST.  Dispense: 65 tablet; Refill: 1  Pain and swelling of eyelid of right eye  Recommend starting with warm compress 4-6 times daily for eye soreness.  Patient verbalized understanding and is agreeable to this plan.  Return in about 6 months (around 01/28/2024) for annual physical, fasting blood work 1 week before.    Melida Quitter, PA

## 2023-08-06 ENCOUNTER — Encounter: Payer: Self-pay | Admitting: Family Medicine

## 2023-08-06 ENCOUNTER — Ambulatory Visit (INDEPENDENT_AMBULATORY_CARE_PROVIDER_SITE_OTHER): Payer: Self-pay | Admitting: Family Medicine

## 2023-08-06 ENCOUNTER — Ambulatory Visit: Payer: Self-pay | Admitting: Family Medicine

## 2023-08-06 VITALS — BP 106/71 | HR 74 | Temp 98.5°F | Ht 66.5 in | Wt 174.2 lb

## 2023-08-06 DIAGNOSIS — L03213 Periorbital cellulitis: Secondary | ICD-10-CM

## 2023-08-06 DIAGNOSIS — H5710 Ocular pain, unspecified eye: Secondary | ICD-10-CM

## 2023-08-06 DIAGNOSIS — H5711 Ocular pain, right eye: Secondary | ICD-10-CM

## 2023-08-06 MED ORDER — AMOXICILLIN-POT CLAVULANATE 875-125 MG PO TABS: 1.0000 | ORAL_TABLET | Freq: Two times a day (BID) | ORAL | 0 refills | Status: DC

## 2023-08-06 NOTE — Telephone Encounter (Signed)
 Copied from CRM [id number]. Topic: Clinical - Red Word Triage >> Aug 06, 2023  8:16 AM Bascom RAMAN wrote: [facility] that prompted transfer to Nurse Triage: Right eye, swollen and red; getting worse .  Chief Complaint:   Right Eyelid  Swollen Symptoms:  Reddened ,Painful to touch Rates 5-6 out of 10. No drainage Frequency:  Progressively getting worse  approximately 10 days  Pertinent Negatives: Patient denies  fever. Disposition: [] ED /[] Urgent Care (no appt availability in office) / [x] Appointment(In office/virtual)/ []  [facility]/ [] Home Care/ [] Refused Recommended Disposition /[] [facility]/ []  Follow-up with PCP Additional Notes:  Provider saw her when it was beginning  and advise to come back if it got worse. Reason for Disposition  [1] SEVERE eyelid swelling on one side AND [2] red and painful (or tender to touch)  Answer Assessment - Initial Assessment Questions 1. ONSET: When did the swelling start? (e.g., minutes, hours, days)      Right  Eye bottom lid  is puffy and sore 2. LOCATION: What part of the eyelids is swollen?      Right Eye 3. SEVERITY: How swollen is it? Eraser       4. ITCHING: Is there any itching? If Yes, ask: How much?   (Scale 1-10; mild, moderate or severe)      Denies  5. PAIN: Is the swelling painful to touch? If Yes, ask: How painful is it?   (Scale 1-10; mild, moderate or severe) Moderate  5-6      6. FEVER: Do you have a fever? If Yes, ask: What is it, how was it measured, and when did it start?       Denies 7. CAUSE: What do you think is causing the swelling?      Do Not know- My be a Stye. 8. RECURRENT SYMPTOM: Have you had eyelid swelling before? If Yes, ask: When was the last time? What happened that time?     Used Stye  ointment  9. OTHER SYMPTOMS: Do you have any other symptoms? (e.g., blurred vision, eye discharge, rash, runny nose)       A little runny nose   almost two weeks 10. PREGNANCY: Is there  any chance you are pregnant? When was your last menstrual period?        Denies  Protocols used: Eye - Swelling-A-AH

## 2023-08-06 NOTE — Telephone Encounter (Signed)
 Patient was seen by provider 08/06/2023

## 2023-08-06 NOTE — Progress Notes (Signed)
   Acute Office Visit  Subjective:     Patient ID: Karen Gross, female    DOB: 1966/07/26, 58 y.o.   MRN: [id number]  Chief Complaint  Patient presents with   Eye Problem    HPI Patient is in today for ongoing and worsening right eye swelling and pain.  Patient initially brought this to PCPs attention about 1 week ago.  At that time, there was no swelling, the lateral corner of the right eye was simply tender to touch.  She has been doing a hot compress 4-5 times each day since then.  The area has become swollen, erythematous, and continues to be very tender.  She also endorses discomfort with movement.  Denies fever, chills, double vision, worsening blurry vision.  It has been quite sometime since she saw an eye doctor  ROS See HPI    Objective:    BP 106/71   Pulse 74   Temp 98.5 F (36.9 C) (Oral)   Ht 5' 6.5 (1.689 m)   Wt 174 lb 4 oz (79 kg)   BMI 27.70 kg/m   Physical Exam Constitutional:      General: She is not in acute distress.    Appearance: Normal appearance. She is not ill-appearing or toxic-appearing.  HENT:     Head: Normocephalic and atraumatic.  Eyes:     General: Lids are normal.        Right eye: No foreign body or discharge.        Left eye: No foreign body or discharge.     Extraocular Movements: Extraocular movements intact.     Conjunctiva/sclera: Conjunctivae normal.     Pupils: Pupils are equal, round, and reactive to light.     Comments: Pain with eye movements, particularly eye abduction and adduction  Cardiovascular:     Rate and Rhythm: Normal rate and regular rhythm.     Heart sounds: No murmur heard.    No friction rub. No gallop.  Pulmonary:     Effort: Pulmonary effort is normal. No respiratory distress.     Breath sounds: No wheezing, rhonchi or rales.  Skin:    General: Skin is warm and dry.  Neurological:     Mental Status: She is alert and oriented to person, place, and time.       Assessment & Plan:  Eye pain,  right -     CT ORBITS W CONTRAST; Future  Periorbital cellulitis of right eye -     CT ORBITS W CONTRAST; Future  Pain on movement of eye -     CT ORBITS W CONTRAST; Future  Other orders -     Amoxicillin -Pot Clavulanate; Take 1 tablet by mouth 2 (two) times daily.  Dispense: 14 tablet; Refill: 0  Difficult to differentiate hordeolum versus periorbital cellulitis definitively based on exam alone.  Concerned for periorbital cellulitis or orbital cellulitis given discomfort with eye movement.  Ordering stat CT orbits, in the meantime we will treat as preseptal cellulitis with 7-day course of Augmentin .  Patient verbalized understanding and is agreeable to this plan.  Return if symptoms worsen or fail to improve.  Joesph DELENA Sear, PA

## 2023-08-06 NOTE — Patient Instructions (Signed)
 Will have your CT scan done as soon as possible.  The pain and swelling worsen, or if it does not improve after finishing the course of antibiotics, please schedule an appointment with your eye doctor as soon as possible.

## 2023-08-07 ENCOUNTER — Telehealth: Payer: Self-pay

## 2023-08-07 DIAGNOSIS — H5711 Ocular pain, right eye: Secondary | ICD-10-CM

## 2023-08-07 NOTE — Telephone Encounter (Signed)
 Referral has been placed.

## 2023-08-07 NOTE — Telephone Encounter (Signed)
 Copied from CRM [id number]. Topic: Referral - Request for Referral >> Aug 07, 2023 10:40 AM Antonio DEL wrote: Did the patient discuss referral with their provider in the last year? Yes (If No - schedule appointment) (If Yes - send message)  Appointment offered? Yes, patient didn't know her eye doctor retired so she's needing a referral to a new eye doctor   Type of order/referral and detailed reason for visit: eye pain  Preference of office, provider, location: Somewhere in [place], not too far out   If referral order, have you been seen by this specialty before?  (If Yes, this issue or another issue? When? Where?  Can we respond through MyChart? No

## 2023-08-08 ENCOUNTER — Ambulatory Visit
Admission: RE | Admit: 2023-08-08 | Discharge: 2023-08-08 | Disposition: A | Discharge status: Home / Self Care | Payer: 59 | Source: Ambulatory Visit | Attending: Family Medicine | Admitting: Family Medicine

## 2023-08-08 ENCOUNTER — Telehealth: Payer: Self-pay | Admitting: *Deleted

## 2023-08-08 DIAGNOSIS — H5710 Ocular pain, unspecified eye: Secondary | ICD-10-CM

## 2023-08-08 DIAGNOSIS — H5711 Ocular pain, right eye: Secondary | ICD-10-CM

## 2023-08-08 DIAGNOSIS — L03213 Periorbital cellulitis: Secondary | ICD-10-CM

## 2023-08-08 MED ORDER — IOPAMIDOL (ISOVUE-300) INJECTION 61%
75.0000 mL | Freq: Once | INTRAVENOUS | Status: AC | PRN
Start: 2023-08-08 — End: 2023-08-08
  Administered 2023-08-08: 75 mL via INTRAVENOUS

## 2023-08-08 NOTE — Telephone Encounter (Signed)
 [facility] calling to be sure provider was aware of imaging report results.  Informed provider that they were in so she could determine next steps. [name]

## 2023-08-08 NOTE — Telephone Encounter (Signed)
 LVM for pt to call office to be sure that she was aware of the results from provider, also received a note stating she needed to be seen by eye doctor sooner than march.  Wanted to let her know that she can call to see who takes her insurance and if they can get her in faster then we can send the referral to another office for her. Will update referral once we hear back from her.

## 2023-08-09 NOTE — Telephone Encounter (Signed)
Pt was informed of below.

## 2023-08-13 ENCOUNTER — Other Ambulatory Visit: Payer: Self-pay | Admitting: Family Medicine

## 2023-08-21 ENCOUNTER — Other Ambulatory Visit: Payer: Self-pay | Admitting: Family Medicine

## 2023-08-21 DIAGNOSIS — B001 Herpesviral vesicular dermatitis: Secondary | ICD-10-CM

## 2023-08-30 ENCOUNTER — Ambulatory Visit (INDEPENDENT_AMBULATORY_CARE_PROVIDER_SITE_OTHER): Payer: 59 | Admitting: Family Medicine

## 2023-08-30 VITALS — Wt 174.0 lb

## 2023-08-30 DIAGNOSIS — Z23 Encounter for immunization: Secondary | ICD-10-CM

## 2023-08-30 NOTE — Progress Notes (Signed)
Patient is here for her first shingle vax she was advised to make another appointment for her second shot

## 2023-08-31 ENCOUNTER — Ambulatory Visit: Payer: 59

## 2023-09-06 ENCOUNTER — Encounter: Payer: Self-pay | Admitting: Family Medicine

## 2023-09-07 ENCOUNTER — Encounter: Payer: Self-pay | Admitting: Family Medicine

## 2023-09-11 ENCOUNTER — Other Ambulatory Visit: Payer: Self-pay | Admitting: Obstetrics and Gynecology

## 2023-09-11 DIAGNOSIS — Z1231 Encounter for screening mammogram for malignant neoplasm of breast: Secondary | ICD-10-CM

## 2023-09-27 ENCOUNTER — Other Ambulatory Visit: Payer: Self-pay | Admitting: Family Medicine

## 2023-09-27 ENCOUNTER — Other Ambulatory Visit: Payer: Self-pay

## 2023-09-27 MED ORDER — TIZANIDINE HCL 2 MG PO TABS: 2.0000 mg | ORAL_TABLET | Freq: Every day | ORAL | 0 refills | Status: DC

## 2023-10-01 NOTE — Progress Notes (Unsigned)
 SUBJECTIVE:  Chief Complaint: Obesity On MAINTENANCE SINCE August 2021    Interim History: She is up 1 lb since her last visit in Oct. 2024 with William Hamburger, NP.  Bio impedence scale reviewed with the patient:  Muscle mass - 5 lbs Adipose mass + 5.6 lbs Total body water + 1.6 lbs  Yicel is here to discuss her progress with her obesity treatment plan. She is on the Category 2 Plan + 100 and states she is following her eating plan approximately 90 % of the time. She states she is exercising walking 60 minutes 3 times per week.   MADDUX FIRST is a 58 year old female with obesity who presents for follow-up of her obesity treatment plan.  Started HWW March 2021 Initial weight was 217 pounds with a BMI of 35.  Current weight is 162 pounds with a BMI of 26.3- significant weight loss!  She adheres to a category two plan plus 100 calories 90% of the time and exercises by walking 60 minutes three days a week. Despite her efforts, she is disappointed in not losing more weight, acknowledging that her medication and age-related metabolic changes might be factors.  She is currently taking ergocalciferol 50,000 units once weekly, with her last vitamin D level within the goal range of 50 to 70. She has not had her vitamin D level checked recently. Other labs were checked with PCP appointment in late Dec. 2024. She reports good energy levels overall but notes feeling tired at the end of the day due to work.  She has a history of hypothyroidism and underwent a hysterectomy years ago. She is unsure about her postmenopausal status as she has not had her hormones checked.  She lives in [place] and does not have access to a gym or trainers. She walks at school and does not weigh herself at home, only during medical visits.  Her son is NOW PLAYING for the [employer]! Her daughter is thriving in her Senior year, on the [employer] and graduates later this year.   OBJECTIVE: Visit  Diagnoses: Problem List Items Addressed This Visit     Vitamin D deficiency   Relevant Orders   VITAMIN D 25 Hydroxy (Vit-D Deficiency, Fractures)   Other Visit Diagnoses       Other hyperlipidemia    -  Primary     Post-menopausal         Generalized obesity- Start BMI 35.0         BMI 26.0-26.9,adult Current BMI 26.3         Obesity Aylana Hirschfeld, a 58 year old female, follows up for obesity management. Since August 2021, she has been on maintenance, with follow-ups every five months. Her weight decreased from 217 lbs (BMI 35) in March 2021 to 162 lbs (BMI 26.3). She adheres to a category two plan plus 100 calories 90% of the time and walks 60 minutes three days weekly.  Despite her efforts, she is disappointed with her progress, likely influenced by medications, hypothyroidism, and postmenopausal status.  Discussed the importance of strength training to counteract muscle mass loss, especially during menopause. Suggested starting with 10-15 minutes of strength training 2-3 days a week and considering a trainer or the Right Start program at [employer] - Continue current nutrition plan - Encourage strength training 2-3 days a week - Consider working with a trainer or joining the Right Start program at [employer]  Hyperlipidemia LDL is not at goal. Triglycerides at goal. HDL 72! Medication(s):  None Cardiovascular risk factors: dyslipidemia  Lab Results  Component Value Date   CHOL 197 07/23/2023   HDL 72 07/23/2023   LDLCALC 117 (H) 07/23/2023   TRIG 41 07/23/2023   CHOLHDL 2.7 07/23/2023   CHOLHDL 2.7 01/24/2023   CHOLHDL 3.2 08/01/2022   Lab Results  Component Value Date   ALT 12 07/23/2023   AST 16 07/23/2023   ALKPHOS 88 07/23/2023   BILITOT 0.3 07/23/2023   The 10-year ASCVD risk score (Arnett DK, et al., 2019) is: 0.9%   Values used to calculate the score:     Age: 28 years     Sex: Female     Is Non-Hispanic African American: No     Diabetic: No      Tobacco smoker: No     Systolic Blood Pressure: 96 mmHg     Is BP treated: No     HDL Cholesterol: 72 mg/dL     Total Cholesterol: 197 mg/dL  Plan: Statin not indicated at this time.  Continue to work on nutrition plan -decreasing simple carbohydrates, increasing lean proteins, decreasing saturated fats and cholesterol , avoiding trans fats and exercise as able to promote weight loss, improve lipids and decrease cardiovascular risks.   Postmenopausal Status Postmenopausal status likely contributes to decreased muscle mass and slower metabolism. She had a hysterectomy years ago and has not had her hormones checked. Emphasized the importance of strength training to counteract muscle mass loss. - Encourage strength training to counteract muscle mass loss  Vitamin D Deficiency Currently on ergocalciferol 50,000 units once weekly. Last vitamin D level was within the goal range of 50-70. No recent vitamin D level checked. No N/V or muscle weakness with Ergo. Reports does not feel fatigued except at end of busy work day.  Last vitamin D Lab Results  Component Value Date   VD25OH 68.9 01/24/2023    - Check vitamin D level - Refill ergocalciferol 50,000 units once weekly and adjust supplementation based on labs Low vitamin D levels can be associated with adiposity and may result in leptin resistance and weight gain. Also associated with fatigue.  Currently on vitamin D supplementation without any adverse effects such as nausea, vomiting or muscle weakness.    General Health Maintenance Overall energy level is good, with fatigue at the end of the workday. Cholesterol and A1c levels from December were within acceptable ranges. - Check labs including vitamin D level  Follow-up - Schedule follow-up appointment with William Hamburger, NP on April 9th at 7:30 AM  Vitals Temp: 97.6 F (36.4 C) BP: 96/61 Pulse Rate: (!) 57 SpO2: 100 %   Anthropometric Measurements Height: 5\' 5"  (1.651  m) Weight: 163 lb (73.9 kg) BMI (Calculated): 27.12 Weight at Last Visit: 162lb Weight Lost Since Last Visit: 0 Weight Gained Since Last Visit: 1lb Starting Weight: 217lb Total Weight Loss (lbs): 53 lb (24 kg)   Body Composition  Body Fat %: 37.8 % Fat Mass (lbs): 61.6 lbs Muscle Mass (lbs): 96.2 lbs Total Body Water (lbs): 69 lbs Visceral Fat Rating : 8   Other Clinical Data Fasting: yes Labs: no Today's Visit #: 19 Starting Date: 10/01/19     ASSESSMENT AND PLAN:  Diet: Charli is currently in the action stage of change. As such, her goal is to continue with weight loss efforts and has agreed to the Category 2 Plan+ 100 calories.   Exercise:  For substantial health benefits, adults should do at least 150 minutes (2 hours and 30  minutes) a week of moderate-intensity, or 75 minutes (1 hour and 15 minutes) a week of vigorous-intensity aerobic physical activity, or an equivalent combination of moderate- and vigorous-intensity aerobic activity. Aerobic activity should be performed in episodes of at least 10 minutes, and preferably, it should be spread throughout the week. and Adults should also include muscle-strengthening activities that involve all major muscle groups on 2 or more days a week.  Behavior Modification:  We discussed the following Behavioral Modification Strategies today: increasing lean protein intake, decreasing simple carbohydrates, increasing vegetables, increase H2O intake, increase high fiber foods, no skipping meals, avoiding temptations, and planning for success. We discussed various medication options to help Marrietta with her weight loss efforts and we both agreed to continue to work on nutritional and behavioral strategies to promote weight loss.  She would like to follow up more frequently given her recent loss of muscle mass and we discussed strategies to work on strengthening as well as possibly doing the Right start program at [employer]  Return  in about 4 weeks (around 10/30/2023).[name] She was informed of the importance of frequent follow up visits to maximize her success with intensive lifestyle modifications for her multiple health conditions.  Attestation Statements:   Reviewed by clinician on day of visit: allergies, medications, problem list, medical history, surgical history, family history, social history, and previous encounter notes.   Time spent on visit including pre-visit chart review and post-visit care and charting was 35 minutes  Babbette Dalesandro,PA-C

## 2023-10-02 ENCOUNTER — Encounter (INDEPENDENT_AMBULATORY_CARE_PROVIDER_SITE_OTHER): Payer: Self-pay | Admitting: Physician Assistant

## 2023-10-02 ENCOUNTER — Ambulatory Visit: Payer: 59 | Admitting: Plastic Surgery

## 2023-10-02 ENCOUNTER — Ambulatory Visit (INDEPENDENT_AMBULATORY_CARE_PROVIDER_SITE_OTHER): Payer: 59 | Admitting: Physician Assistant

## 2023-10-02 VITALS — BP 96/61 | HR 57 | Temp 97.6°F | Ht 65.0 in | Wt 163.0 lb

## 2023-10-02 DIAGNOSIS — E669 Obesity, unspecified: Secondary | ICD-10-CM | POA: Diagnosis not present

## 2023-10-02 DIAGNOSIS — Z780 Asymptomatic menopausal state: Secondary | ICD-10-CM

## 2023-10-02 DIAGNOSIS — E559 Vitamin D deficiency, unspecified: Secondary | ICD-10-CM

## 2023-10-02 DIAGNOSIS — Z6826 Body mass index (BMI) 26.0-26.9, adult: Secondary | ICD-10-CM | POA: Diagnosis not present

## 2023-10-02 DIAGNOSIS — E7849 Other hyperlipidemia: Secondary | ICD-10-CM

## 2023-10-02 MED ORDER — VITAMIN D (ERGOCALCIFEROL) 1.25 MG (50000 UNIT) PO CAPS: 50000.0000 [IU] | ORAL_CAPSULE | ORAL | 0 refills | Status: DC

## 2023-10-03 LAB — VITAMIN D 25 HYDROXY (VIT D DEFICIENCY, FRACTURES): Vit D, 25-Hydroxy: 70.9 ng/mL (ref 30.0–100.0)

## 2023-10-06 ENCOUNTER — Other Ambulatory Visit: Payer: Self-pay | Admitting: Family Medicine

## 2023-10-16 ENCOUNTER — Ambulatory Visit
Admission: RE | Admit: 2023-10-16 | Discharge: 2023-10-16 | Disposition: A | Discharge status: Home / Self Care | Payer: 59 | Source: Ambulatory Visit | Attending: Obstetrics and Gynecology | Admitting: Obstetrics and Gynecology

## 2023-10-16 DIAGNOSIS — Z1231 Encounter for screening mammogram for malignant neoplasm of breast: Secondary | ICD-10-CM

## 2023-10-25 NOTE — Progress Notes (Signed)
   Referring Provider Melida Quitter, PA [address] [place],  [place] [address]   CC:  Chief Complaint  Patient presents with   Follow-up      Karen Gross is an 58 y.o. female.   HPI: Patient is a 58 y.o. year old female who presents to clinic for follow-up.  She was seen for initial consult by Dr. Ulice Bold on 03/06/2023. At that time, complained of chronic upper back and neck discomfort in the context of large breasts. STN 30 cm each breast. BMI equals 26.8 kg/m. Preoperative bra size equals DD cup. She expressed that she would like to be a B cup postoperatively. Estimated excess tissue to be removed at time of surgery close 450 to 500 g each side. Mammogram 08/2022.  After completing PT, insurance authorization was submitted and denied x 2.  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  She has already lost over 50 pounds and has been stable and satisfied with current weight.  She is still looking to move forward with breast reduction surgery, but is understandably frustrated given her denials.   Review of Systems General: Denies fevers MSK: Endorses ongoing back and neck discomfort Skin: Intermittent inframammary rashes  Physical Exam    10/26/2023    8:05 AM 10/02/2023    6:00 AM 08/30/2023    8:38 AM  Vitals with BMI  Height 5' 6.5" 5\' 5"    Weight 165 lbs 6 oz 163 lbs 174 lbs  BMI 26.3 27.12 27.67  Systolic 100 96   Diastolic 67 61   Pulse 73 57     General:  No acute distress,  Alert and oriented, Non-Toxic, Normal speech and affect Psych: Normal behavior and mood Respiratory: No increased WOB MSK: Ambulatory  Assessment/Plan  Symptomatic mammary hypertrophy:  Patient is of the same weight and BSA that she was at time of consult 03/2023.  Per Monia Pouch guidelines and Mosteller formula, BSA of 1.88 m requires 755 g as minimum amount of breast tissue to be excised at time of surgery.  Confirmed with Dr. Ulice Bold that based on patient's breast  size, the most that would be safe and realistic for removal at time of surgery would be 600 g.  This would require a BSA of 1.75 m which at her height translates to a weight of 145 pounds.  Patient does not believe that she will be able to or even want to lose an additional 20 pounds as she is satisfied with weight.  They are on state insurance and do not anticipate changing anytime soon.  For that reason, they are interested in a cosmetic quote for the bilateral breast reduction surgery as she remains eager to proceed.   [name] 10/26/2023, 8:42 AM

## 2023-10-26 ENCOUNTER — Ambulatory Visit: Payer: 59 | Admitting: Physician Assistant

## 2023-10-26 VITALS — BP 100/67 | HR 73 | Ht 66.5 in | Wt 165.4 lb

## 2023-10-26 DIAGNOSIS — N62 Hypertrophy of breast: Secondary | ICD-10-CM

## 2023-11-07 ENCOUNTER — Encounter (INDEPENDENT_AMBULATORY_CARE_PROVIDER_SITE_OTHER): Payer: Self-pay | Admitting: Adult Health

## 2023-11-07 ENCOUNTER — Ambulatory Visit (INDEPENDENT_AMBULATORY_CARE_PROVIDER_SITE_OTHER): Admitting: Adult Health

## 2023-11-07 VITALS — BP 96/61 | HR 62 | Temp 98.0°F | Ht 65.0 in | Wt 160.0 lb

## 2023-11-07 DIAGNOSIS — Z0000 Encounter for general adult medical examination without abnormal findings: Secondary | ICD-10-CM

## 2023-11-07 DIAGNOSIS — E7849 Other hyperlipidemia: Secondary | ICD-10-CM | POA: Diagnosis not present

## 2023-11-07 DIAGNOSIS — N6489 Other specified disorders of breast: Secondary | ICD-10-CM

## 2023-11-07 DIAGNOSIS — E038 Other specified hypothyroidism: Secondary | ICD-10-CM

## 2023-11-07 DIAGNOSIS — E559 Vitamin D deficiency, unspecified: Secondary | ICD-10-CM

## 2023-11-07 DIAGNOSIS — Z780 Asymptomatic menopausal state: Secondary | ICD-10-CM | POA: Diagnosis not present

## 2023-11-07 DIAGNOSIS — Z6826 Body mass index (BMI) 26.0-26.9, adult: Secondary | ICD-10-CM

## 2023-11-07 DIAGNOSIS — E669 Obesity, unspecified: Secondary | ICD-10-CM

## 2023-11-07 NOTE — Progress Notes (Signed)
 WEIGHT SUMMARY AND BIOMETRICS  Vitals Temp: 98 F (36.7 C) BP: 96/61 Pulse Rate: 62 SpO2: 99 %   Anthropometric Measurements Height: 5\' 5"  (1.651 m) Weight: 160 lb (72.6 kg) BMI (Calculated): 26.63 Weight at Last Visit: 163lb Weight Lost Since Last Visit: 3lb Weight Gained Since Last Visit: 0 Starting Weight: 217lb Total Weight Loss (lbs): 57 lb (25.9 kg)   Body Composition  Body Fat %: 37.6 % Fat Mass (lbs): 60.4 lbs Muscle Mass (lbs): 95.2 lbs Total Body Water (lbs): 67.2 lbs Visceral Fat Rating : 8   Other Clinical Data Fasting: yes Labs: no Today's Visit #: 20lb Starting Date: 10/01/19    Chief Complaint:   OBESITY Karen Gross is here to discuss her progress with her obesity treatment plan.  She is on the the Category 2 Plan +100 and states she is following her eating plan approximately 50 % of the time.  She states she is exercising Walking 15 minutes 5-7 times per week.   Interim History:  Her bilateral breast reduction procedure was DENIED by her insurance provider- reviewed OV from last several Plastics encounters  She started HWW 10/01/2019, weight 217 lbs with correspondong BMI 35 Started Mx Phase on 03/11/2020, weight 185 lbs with corresponding BMI 29  Today weight 160 lbs with corresponding BMI 26. Visceral Adipose Rating at 8: at goal  She would like more frequent f/u, 6 weeks rather than 3-5 months  Subjective:   1. Bilateral pendulous breasts 10/26/2023 Plastics OV  HPI: Patient is a 58 y.o. year old female who presents to clinic for follow-up. She was seen for initial consult by Dr. Ulice Bold on 03/06/2023. At that time, complained of chronic upper back and neck discomfort in the context of large breasts. STN 30 cm each breast. BMI equals 26.8 kg/m. Preoperative bra size equals DD cup. She expressed that she would like to be a B cup postoperatively. Estimated excess tissue to be removed at time of surgery close 450 to 500 g each side.  Mammogram 08/2022.  After completing PT, insurance authorization was submitted and denied x 2. 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  She has already lost over 50 pounds and has been stable and satisfied with current weight.  She is still looking to move forward with breast reduction surgery, but is understandably frustrated given her denials.  Assessment/Plan Symptomatic mammary hypertrophy: Patient is of the same weight and BSA that she was at time of consult 03/2023.  Per Monia Pouch guidelines and Mosteller formula, BSA of 1.88 m requires 755 g as minimum amount of breast tissue to be excised at time of surgery.  Confirmed with Dr. Ulice Bold that based on patient's breast size, the most that would be safe and realistic for removal at time of surgery would be 600 g.  This would require a BSA of 1.75 m which at her height translates to a weight of 145 pounds. Patient does not believe that she will be able to or even want to lose an additional 20 pounds as she is satisfied with weight.  They are on state insurance and do not anticipate changing anytime soon.  For that reason, they are interested in a cosmetic quote for the bilateral breast reduction surgery as she remains eager to proceed.  2. Other hyperlipidemia She denies tobacco/vape use She denies CP with exertion She is not on statin therapy  3. Post-menopausal She endorses stable energy and appetite levels. She is frustrated that her insurance will  not approve her bilateral breast reduction  4. Vitamin D deficiency Psychiatrist is now managing weekly Ergocalciferol She denies N/V/Muscle Weakness  5. Other specified hypothyroidism She endorses stable energy levels PCP manages Levothyroxine therapy Mondays and Thursday Sunday, Tuesday, Wednesday, Friday, Saturday  6. Healthcare maintenance  She established with new PCP/Morgan Edstrom , PA-C on 01/18/2023   Assessment/Plan:   1. Bilateral  pendulous breasts F/u with Plastics  2. Other hyperlipidemia (Primary) Limit Sat Fat Continue regular walking  3. Post-menopausal Check labs - Vitamin B12  4. Vitamin D deficiency Check Labs  5. Other specified hypothyroidism Check Labs - TSH + free T4 Continue Levothyroxine per PCP  6. Healthcare maintenance Check Labs - Comprehensive metabolic panel with GFR - Hemoglobin A1c - Insulin, random  7. BMI 26.0-26.9,adult Current BMI 26.3  Karen Gross is currently in the action stage of change. As such, her goal is to continue with weight loss efforts. She has agreed to the Category 2 Plan. +100  Exercise goals: For substantial health benefits, adults should do at least 150 minutes (2 hours and 30 minutes) a week of moderate-intensity, or 75 minutes (1 hour and 15 minutes) a week of vigorous-intensity aerobic physical activity, or an equivalent combination of moderate- and vigorous-intensity aerobic activity. Aerobic activity should be performed in episodes of at least 10 minutes, and preferably, it should be spread throughout the week.  Behavioral modification strategies: increasing lean protein intake, decreasing simple carbohydrates, increasing vegetables, increasing water intake, decreasing eating out, no skipping meals, meal planning and cooking strategies, keeping healthy foods in the home, and planning for success.  Karen Gross has agreed to follow-up with our clinic in 6 weeks. She was informed of the importance of frequent follow-up visits to maximize her success with intensive lifestyle modifications for her multiple health conditions.   Karen Gross was informed we would discuss her lab results at her next visit unless there is a critical issue that needs to be addressed sooner. Karen Gross agreed to keep her next visit at the agreed upon time to discuss these results.  Objective:   Blood pressure 96/61, pulse 62, temperature 98 F (36.7 C), height 5\' 5"  (1.651 m), weight 160 lb (72.6  kg), SpO2 99%. Body mass index is 26.63 kg/m.  General: Cooperative, alert, well developed, in no acute distress. HEENT: Conjunctivae and lids unremarkable. Cardiovascular: Regular rhythm.  Lungs: Normal work of breathing. Neurologic: No focal deficits.   Lab Results  Component Value Date   CREATININE 0.82 07/23/2023   BUN 19 07/23/2023   NA 145 (H) 07/23/2023   K 4.4 07/23/2023   CL 109 (H) 07/23/2023   CO2 23 07/23/2023   Lab Results  Component Value Date   ALT 12 07/23/2023   AST 16 07/23/2023   ALKPHOS 88 07/23/2023   BILITOT 0.3 07/23/2023   Lab Results  Component Value Date   HGBA1C 5.2 07/23/2023   HGBA1C 5.0 01/24/2023   HGBA1C 5.1 12/04/2022   HGBA1C 5.1 08/01/2022   HGBA1C 5.0 01/12/2021   Lab Results  Component Value Date   INSULIN 5.8 12/04/2022   INSULIN 6.5 10/01/2019   Lab Results  Component Value Date   TSH 3.440 07/23/2023   Lab Results  Component Value Date   CHOL 197 07/23/2023   HDL 72 07/23/2023   LDLCALC 117 (H) 07/23/2023   TRIG 41 07/23/2023   CHOLHDL 2.7 07/23/2023   Lab Results  Component Value Date   VD25OH 70.9 10/02/2023   VD25OH 68.9 01/24/2023  VD25OH 63.9 12/04/2022   Lab Results  Component Value Date   WBC 4.3 07/23/2023   HGB 15.7 07/23/2023   HCT 47.6 (H) 07/23/2023   MCV 94 07/23/2023   PLT 220 07/23/2023   Lab Results  Component Value Date   FERRITIN 24 10/04/2020    Attestation Statements:   Reviewed by clinician on day of visit: allergies, medications, problem list, medical history, surgical history, family history, social history, and previous encounter notes.  I have reviewed the above documentation for accuracy and completeness, and I agree with the above. -  Skylin Kennerson d. Aljean Horiuchi, NP-C

## 2023-11-08 ENCOUNTER — Encounter: Payer: Self-pay | Admitting: Plastic Surgery

## 2023-11-08 LAB — COMPREHENSIVE METABOLIC PANEL WITH GFR
ALT: 20 IU/L (ref 0–32)
AST: 18 IU/L (ref 0–40)
Albumin: 4.4 g/dL (ref 3.8–4.9)
Alkaline Phosphatase: 89 IU/L (ref 44–121)
BUN/Creatinine Ratio: 24 — ABNORMAL HIGH (ref 9–23)
BUN: 21 mg/dL (ref 6–24)
Bilirubin Total: 0.4 mg/dL (ref 0.0–1.2)
CO2: 20 mmol/L (ref 20–29)
Calcium: 9.3 mg/dL (ref 8.7–10.2)
Chloride: 107 mmol/L — ABNORMAL HIGH (ref 96–106)
Creatinine, Ser: 0.87 mg/dL (ref 0.57–1.00)
Globulin, Total: 2 g/dL (ref 1.5–4.5)
Glucose: 84 mg/dL (ref 70–99)
Potassium: 4.1 mmol/L (ref 3.5–5.2)
Sodium: 144 mmol/L (ref 134–144)
Total Protein: 6.4 g/dL (ref 6.0–8.5)
eGFR: 79 mL/min/{1.73_m2} (ref >59)

## 2023-11-08 LAB — TSH+FREE T4
Free T4: 2.15 ng/dL — ABNORMAL HIGH (ref 0.82–1.77)
TSH: 4.63 u[IU]/mL — ABNORMAL HIGH (ref 0.450–4.500)

## 2023-11-08 LAB — HEMOGLOBIN A1C
Est. average glucose Bld gHb Est-mCnc: 97 mg/dL
Hgb A1c MFr Bld: 5 % (ref 4.8–5.6)

## 2023-11-08 LAB — VITAMIN B12: Vitamin B-12: 422 pg/mL (ref 232–1245)

## 2023-11-08 LAB — INSULIN, RANDOM: INSULIN: 5.1 u[IU]/mL (ref 2.6–24.9)

## 2023-11-12 ENCOUNTER — Encounter (INDEPENDENT_AMBULATORY_CARE_PROVIDER_SITE_OTHER): Payer: Self-pay | Admitting: Adult Health

## 2023-11-13 ENCOUNTER — Encounter: Payer: Self-pay | Admitting: Plastic Surgery

## 2023-11-13 IMAGING — CT CT ABD-PELV W/ CM
2 of 5 series · 16 of 46 positions shown, 18 images · IV contrast (APPLIED)
Comparison: December 03, 2017.

CLINICAL DATA: A 55-year-old female presents with RIGHT lower
quadrant pain for 6 days, denies nausea, vomiting and fever.

EXAM:
CT ABDOMEN AND PELVIS WITH CONTRAST
TECHNIQUE: Multidetector CT imaging of the abdomen and pelvis was performed
using the standard protocol following bolus administration of
intravenous contrast.

[Series 2: abd pel w · axial · 0.75mm/px · z∈[-242,+163]mm · 13 of 91 slices shown, 15 images]
[im 5/91  soft-tissue]
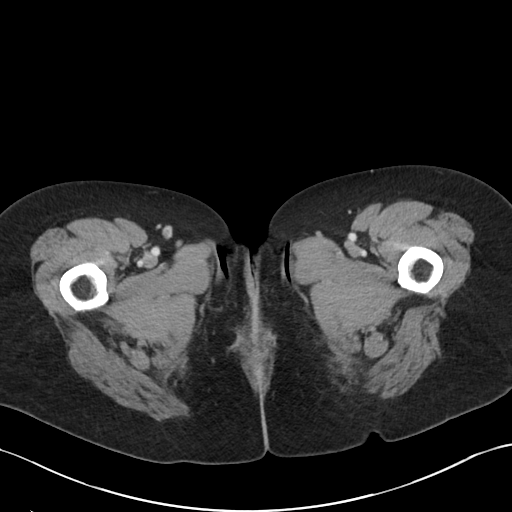
[im 5/91  bone]
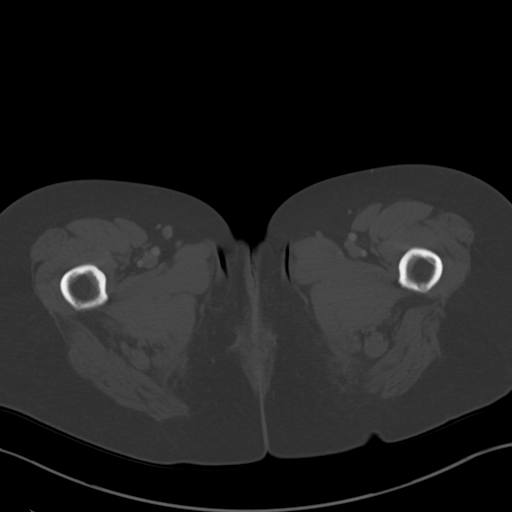
[im 15/91  soft-tissue]
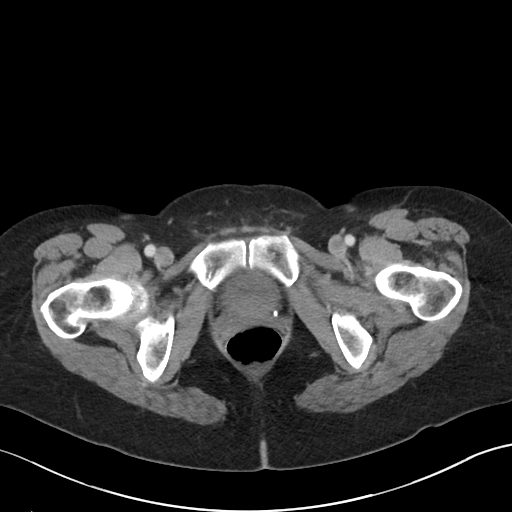
[im 19/91  soft-tissue]
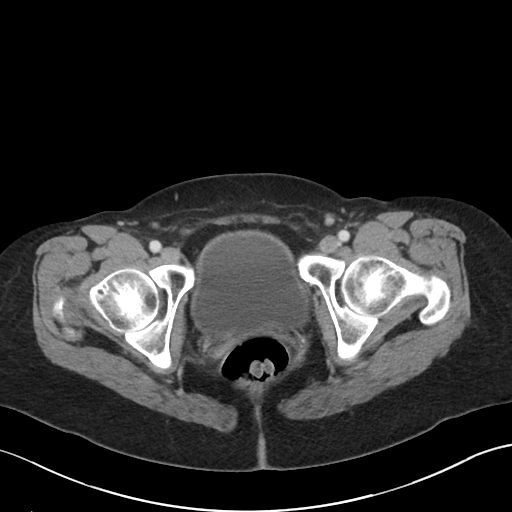
[im 24/91  soft-tissue]
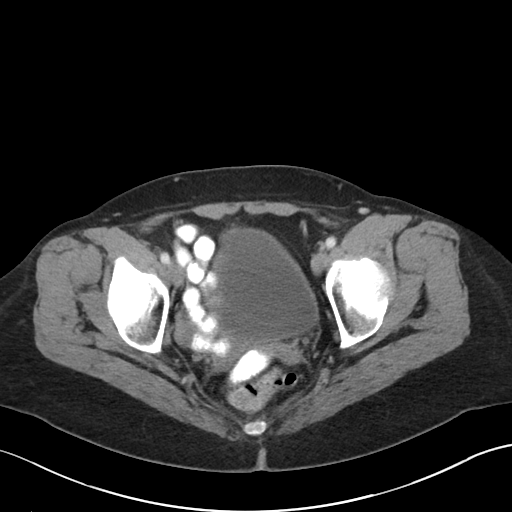
[im 34/91  soft-tissue]
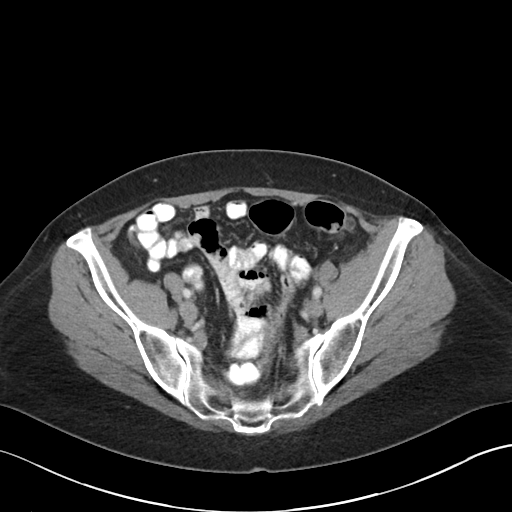
[im 38/91  soft-tissue]
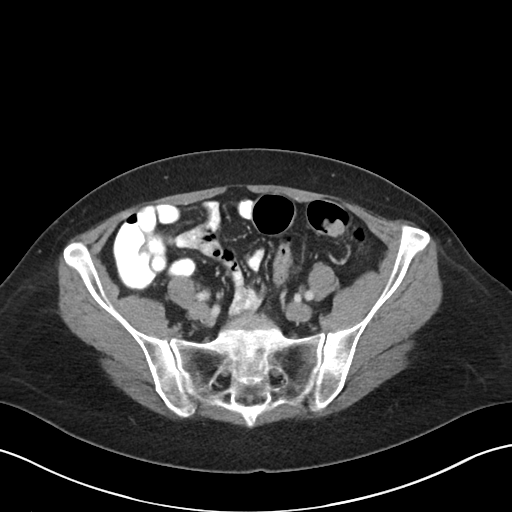
[im 48/91  soft-tissue]
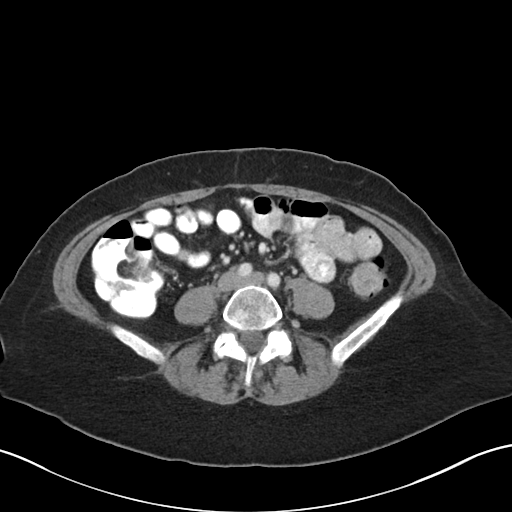
[im 53/91  soft-tissue]
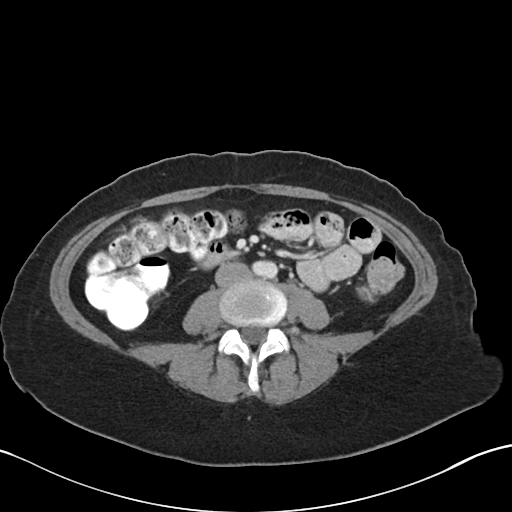
[im 57/91  soft-tissue]
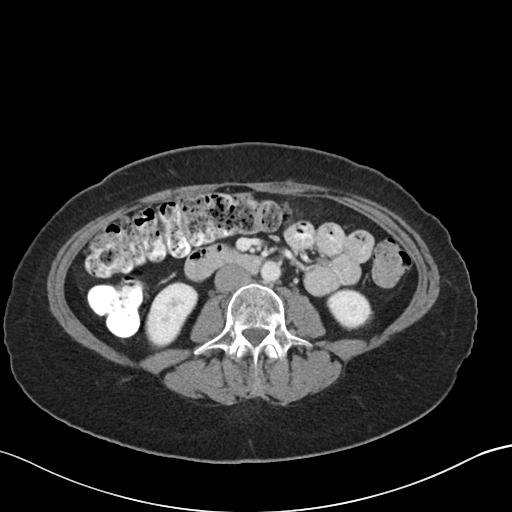
[im 57/91  bone]
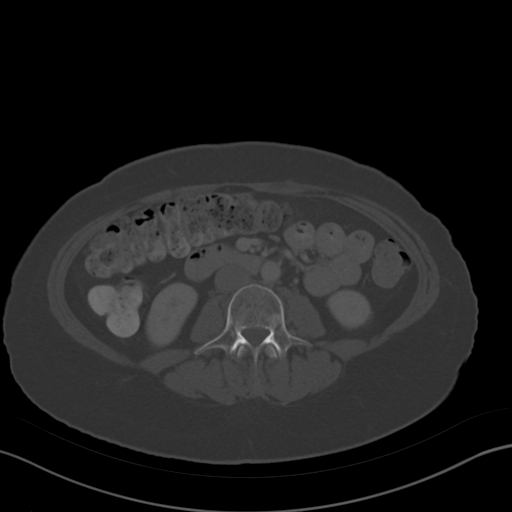
[im 67/91  soft-tissue]
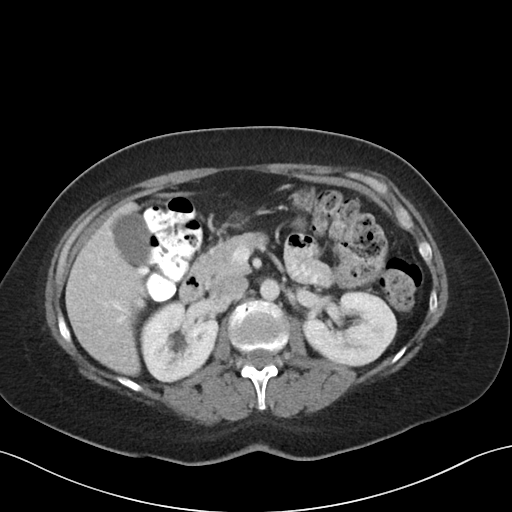
[im 72/91  soft-tissue]
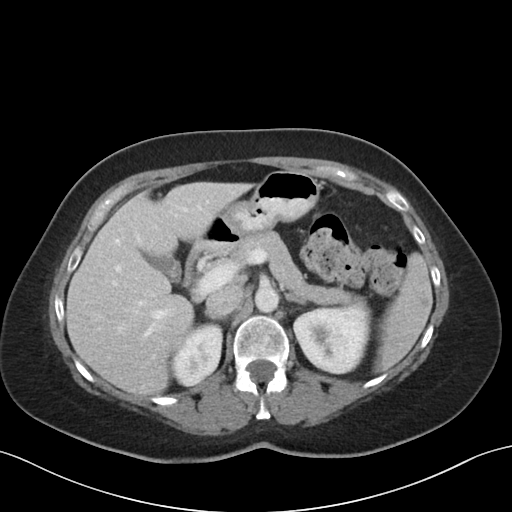
[im 76/91  soft-tissue]
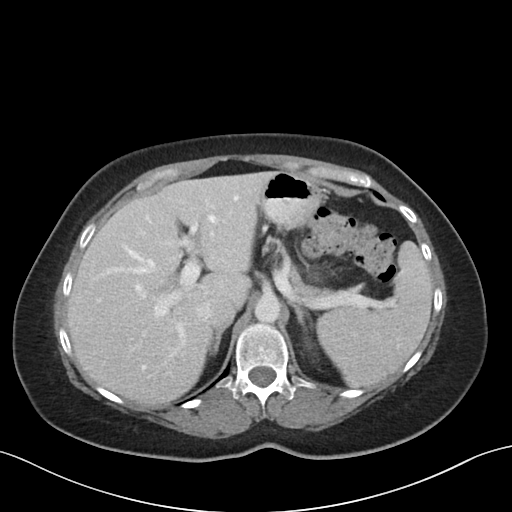
[im 86/91  soft-tissue]
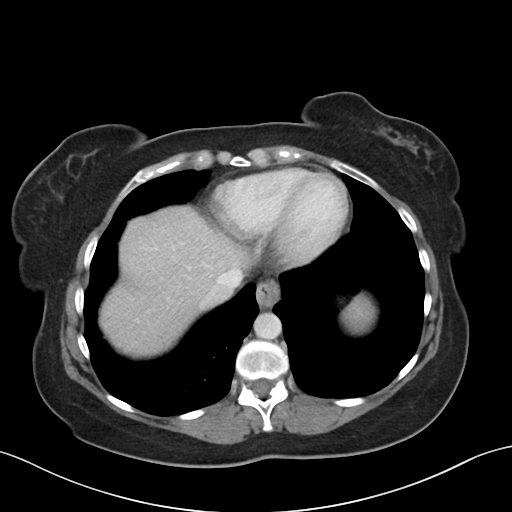

[Series 5: coronal · coronal · 0.72mm/px · 3 of 97 slices shown]
[im 33/97  soft-tissue]
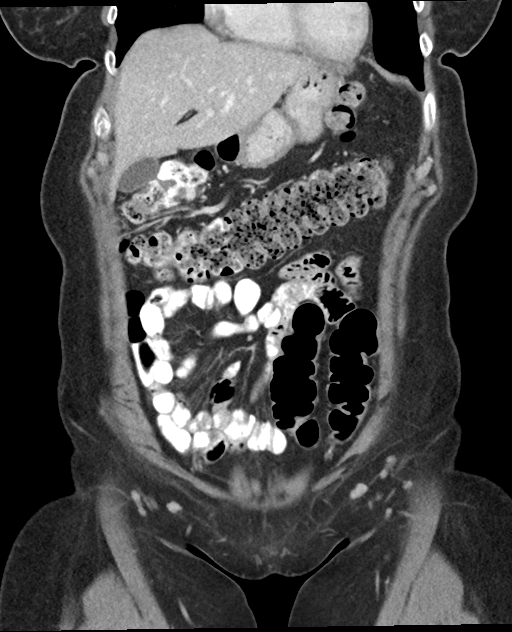
[im 43/97  soft-tissue]
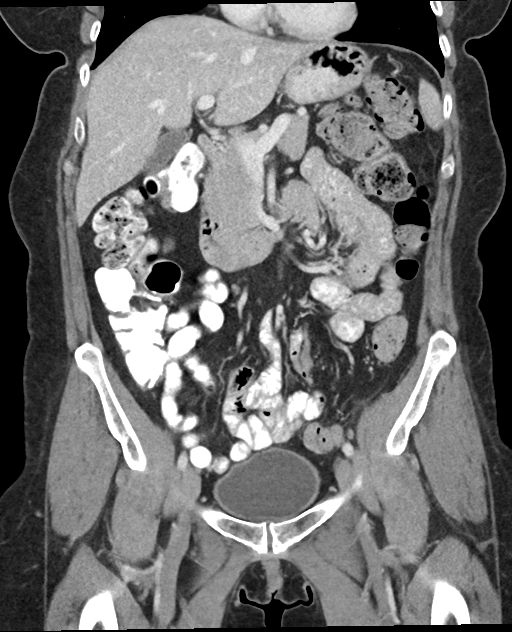
[im 54/97  soft-tissue]
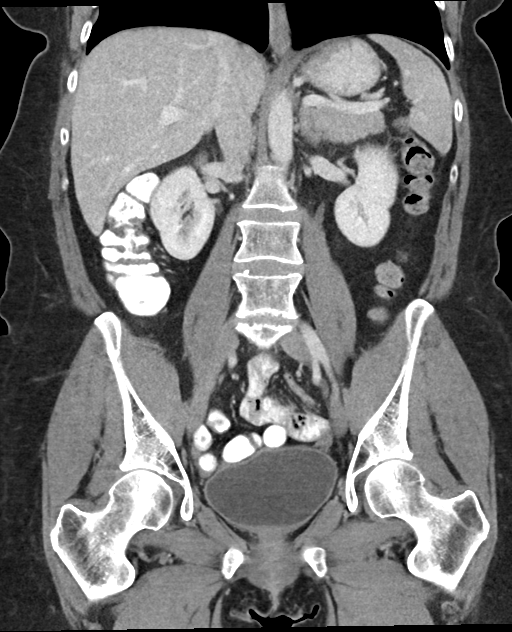

[16 of 46 positions shown; findings below may reference images not displayed]

RADIATION DOSE REDUCTION: This exam was performed according to the
departmental dose-optimization program which includes automated
exposure control, adjustment of the mA and/or kV according to
patient size and/or use of iterative reconstruction technique.

CONTRAST:  100mL OMNIPAQUE IOHEXOL 300 MG/ML  SOLN
FINDINGS: Lower chest: Unremarkable, no acute findings.

Hepatobiliary: No focal, suspicious hepatic lesion. No
pericholecystic stranding. No biliary duct dilation. Portal vein is
patent.

Pancreas: Unremarkable, no signs of inflammation or ductal dilation.
No visible lesion.

Spleen: Normal size. Stable low-attenuation lesion in the inferior
spleen likely small cyst or hemangioma measuring approximately 10-11
mm.

Adrenals/Urinary Tract:

Adrenal glands are unremarkable. Symmetric renal enhancement. No
sign of hydronephrosis. No suspicious renal lesion or perinephric
stranding.

Urinary bladder is grossly unremarkable.

Small cyst in the upper pole the RIGHT kidney. No visible
nephrolithiasis or ureteral calculi.

Stomach/Bowel: Normal appendix. No acute bowel finding. Stomach
under distended without adjacent stranding.

Vascular/Lymphatic:

Aorta with smooth contours. IVC with smooth contours. No aneurysmal
dilation of the abdominal aorta. There is no gastrohepatic or
hepatoduodenal ligament lymphadenopathy. No retroperitoneal or
mesenteric lymphadenopathy.

No pelvic sidewall lymphadenopathy.

Reproductive: Post hysterectomy without adnexal mass.

Other: No free air.  No ascites.

Musculoskeletal: No acute or significant osseous findings.
IMPRESSION: 1. No acute findings.
2. Normal appendix.

## 2023-12-19 ENCOUNTER — Encounter (INDEPENDENT_AMBULATORY_CARE_PROVIDER_SITE_OTHER): Payer: Self-pay | Admitting: Adult Health

## 2023-12-19 ENCOUNTER — Ambulatory Visit (INDEPENDENT_AMBULATORY_CARE_PROVIDER_SITE_OTHER): Admitting: Adult Health

## 2023-12-19 VITALS — BP 95/59 | HR 78 | Temp 98.1°F | Ht 65.0 in | Wt 160.0 lb

## 2023-12-19 DIAGNOSIS — E669 Obesity, unspecified: Secondary | ICD-10-CM | POA: Diagnosis not present

## 2023-12-19 DIAGNOSIS — E559 Vitamin D deficiency, unspecified: Secondary | ICD-10-CM | POA: Diagnosis not present

## 2023-12-19 DIAGNOSIS — E038 Other specified hypothyroidism: Secondary | ICD-10-CM

## 2023-12-19 DIAGNOSIS — N6489 Other specified disorders of breast: Secondary | ICD-10-CM | POA: Diagnosis not present

## 2023-12-19 DIAGNOSIS — Z6826 Body mass index (BMI) 26.0-26.9, adult: Secondary | ICD-10-CM

## 2023-12-19 DIAGNOSIS — Z0000 Encounter for general adult medical examination without abnormal findings: Secondary | ICD-10-CM

## 2023-12-19 MED ORDER — VITAMIN D (ERGOCALCIFEROL) 1.25 MG (50000 UNIT) PO CAPS
50000.0000 [IU] | ORAL_CAPSULE | ORAL | 0 refills | Status: DC
Start: 2023-12-19 — End: 2024-02-07
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
## 2024-01-08 IMAGING — DX DG LUMBAR SPINE 2-3V
3 series · 3 of 3 positions shown · non-contrast
Comparison: None.

CLINICAL DATA: Lower back pain for several months without known
injury.

EXAM:
LUMBAR SPINE - 2-3 VIEW

[l-spine lateral]
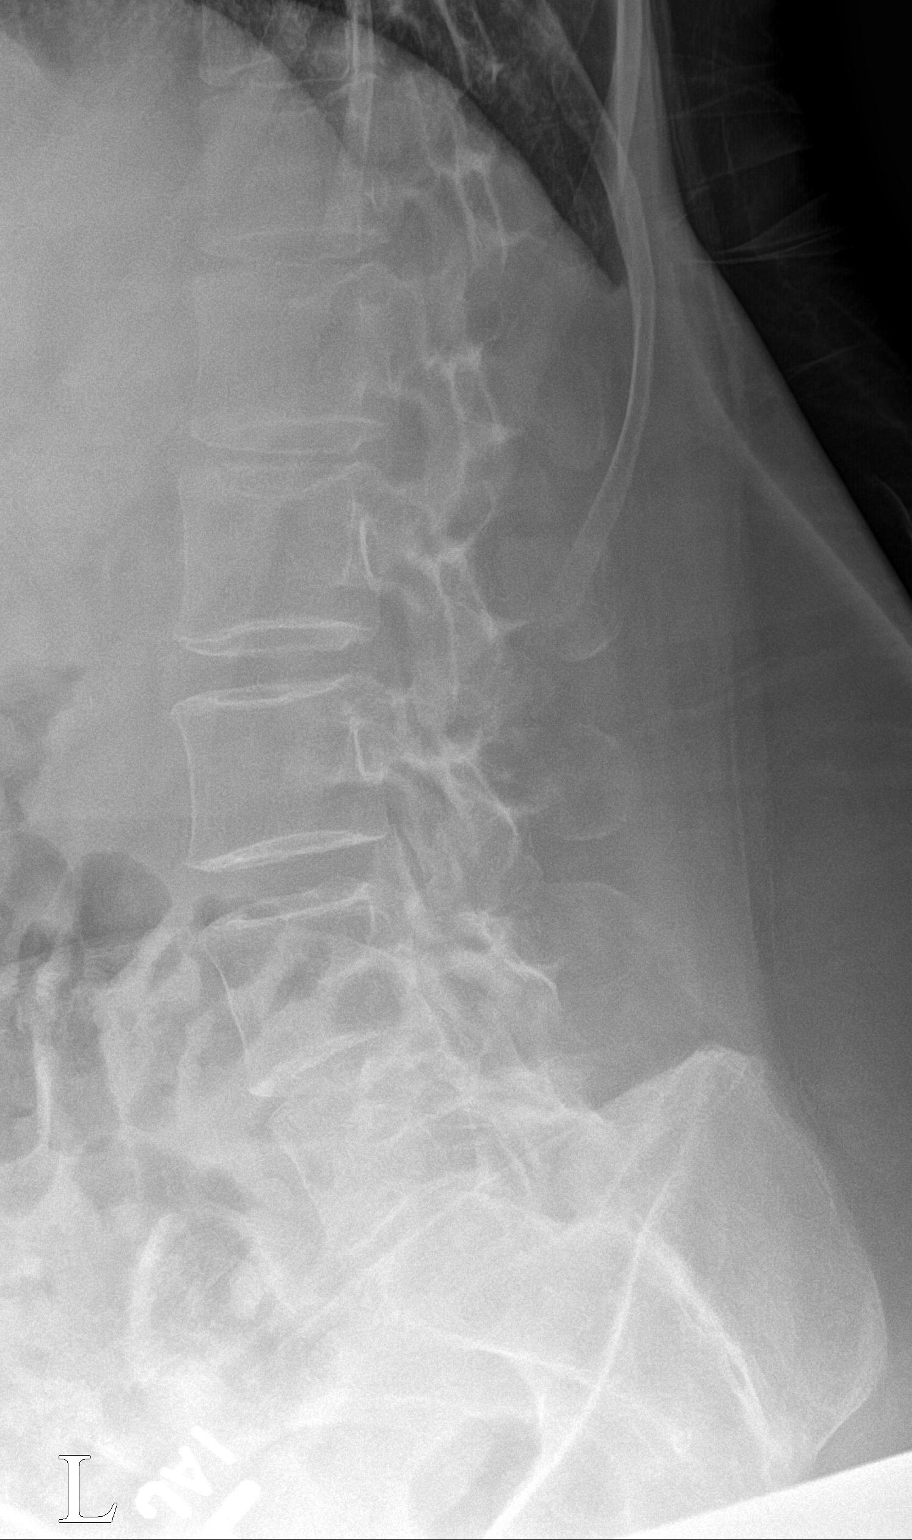

[l-spine spot]
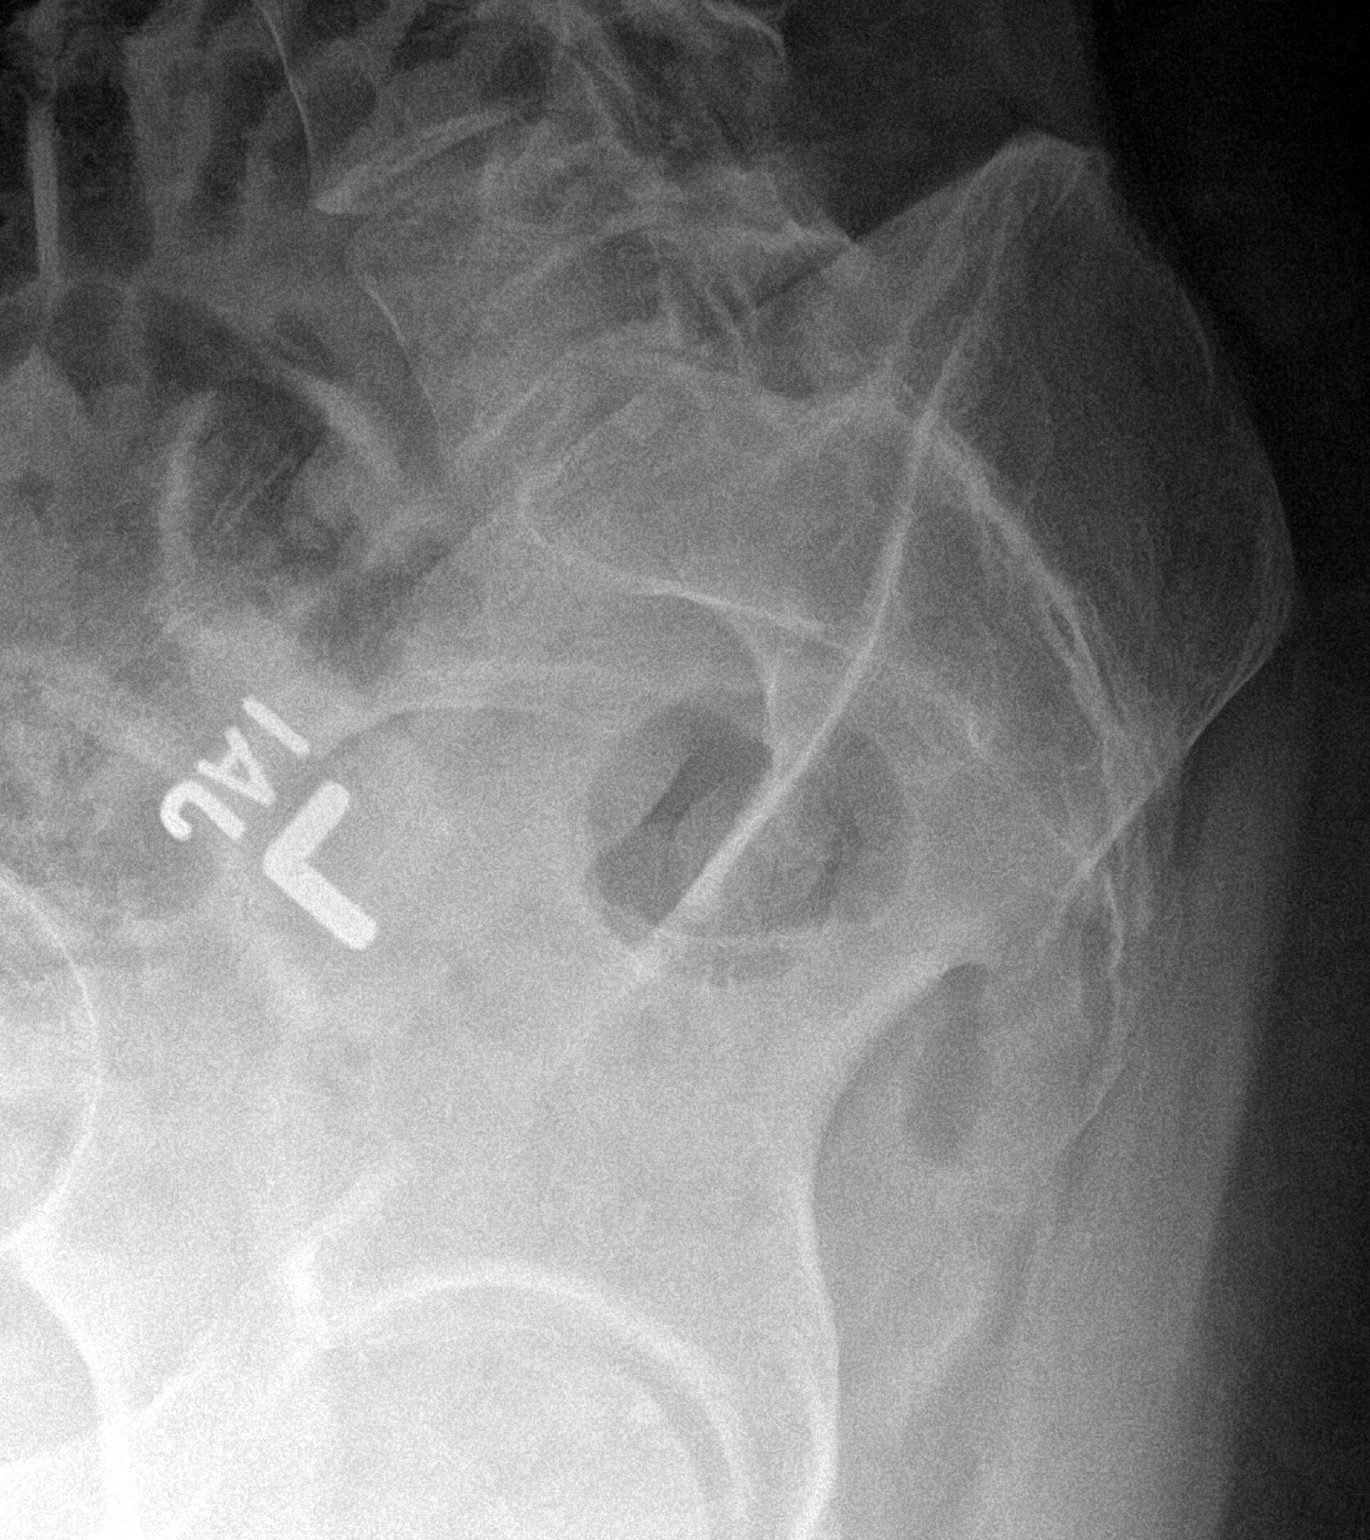

[l-spine ap]
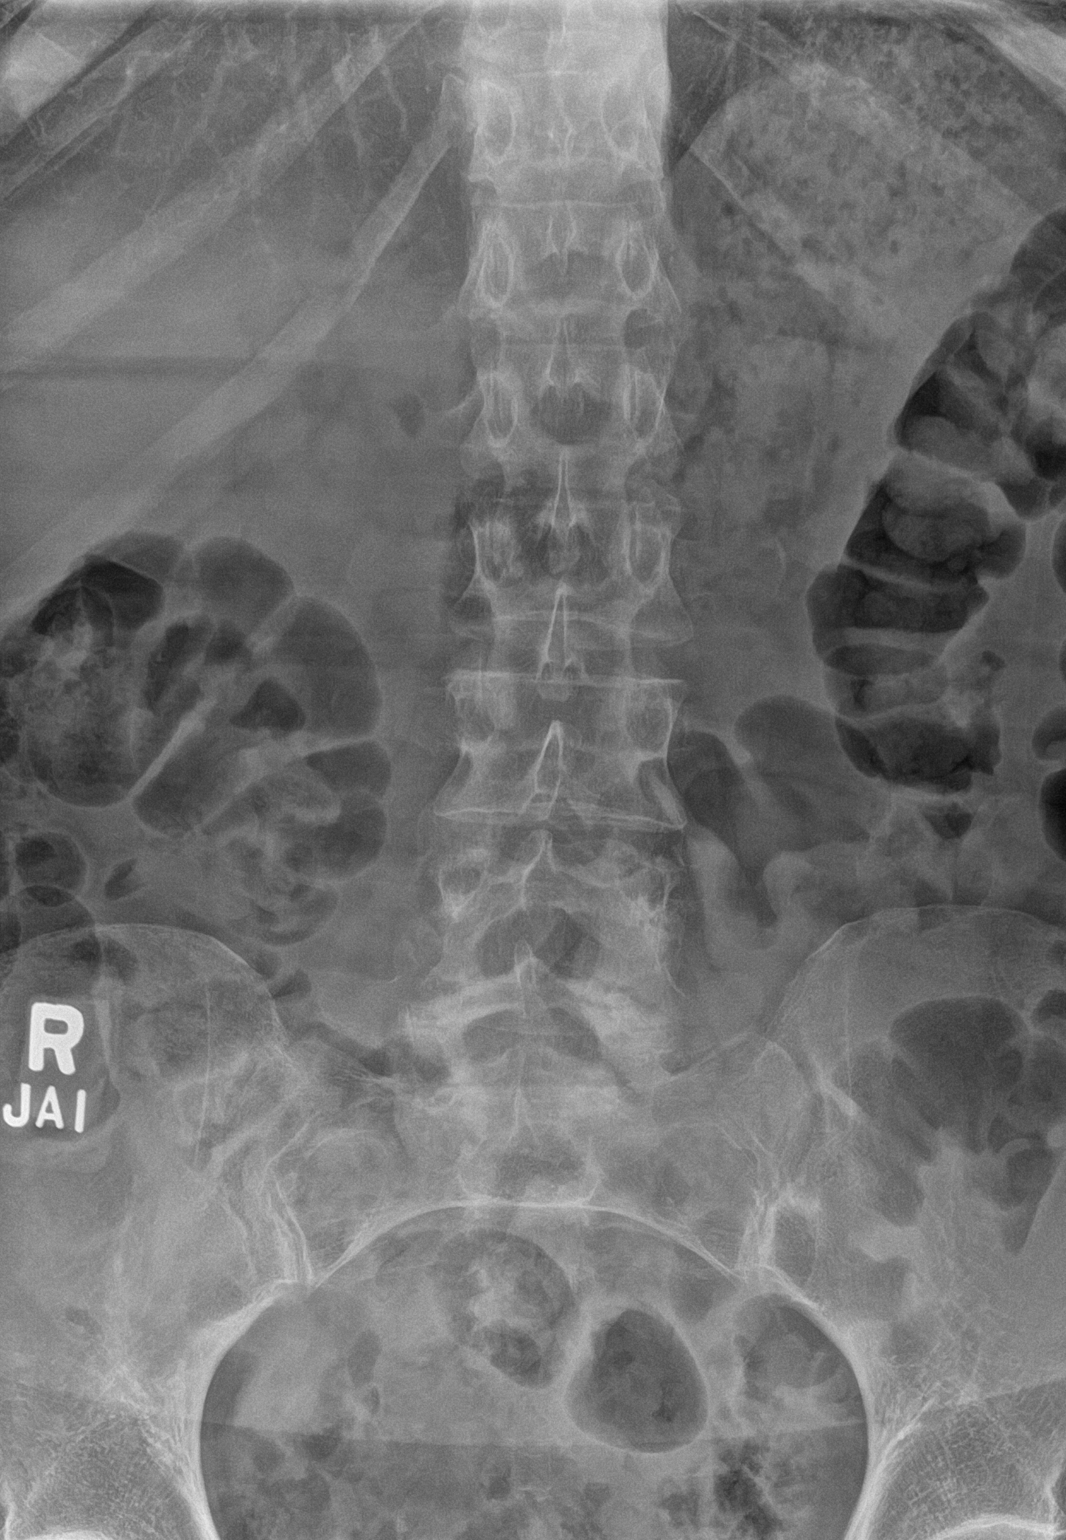

[3 of 3 positions shown; findings below may reference images not displayed]

FINDINGS: There is no evidence of lumbar spine fracture. Alignment is normal.
Moderate degenerative disc disease is noted at L4-5.
IMPRESSION: Moderate degenerative disc disease at L4-5. No acute abnormality is
noted.

## 2024-01-19 ENCOUNTER — Other Ambulatory Visit: Payer: Self-pay | Admitting: Family Medicine

## 2024-01-19 DIAGNOSIS — E038 Other specified hypothyroidism: Secondary | ICD-10-CM

## 2024-01-28 ENCOUNTER — Other Ambulatory Visit (INDEPENDENT_AMBULATORY_CARE_PROVIDER_SITE_OTHER): Payer: Self-pay | Admitting: Adult Health

## 2024-01-28 ENCOUNTER — Other Ambulatory Visit: Payer: BC Managed Care – PPO

## 2024-01-29 ENCOUNTER — Telehealth (INDEPENDENT_AMBULATORY_CARE_PROVIDER_SITE_OTHER): Payer: Self-pay | Admitting: Adult Health

## 2024-01-29 NOTE — Telephone Encounter (Signed)
 Patient called in stating she has an appointment scheduled on 02/07/24 however she has ran out of her Vitamin D  and the pharmacy told her the refill was denied. Patient asking if you will refill before appointment.

## 2024-02-04 ENCOUNTER — Encounter: Payer: BC Managed Care – PPO | Admitting: Family Medicine

## 2024-02-07 ENCOUNTER — Other Ambulatory Visit: Payer: Self-pay | Admitting: *Deleted

## 2024-02-07 ENCOUNTER — Ambulatory Visit (INDEPENDENT_AMBULATORY_CARE_PROVIDER_SITE_OTHER): Admitting: Adult Health

## 2024-02-07 ENCOUNTER — Encounter (INDEPENDENT_AMBULATORY_CARE_PROVIDER_SITE_OTHER): Payer: Self-pay | Admitting: Adult Health

## 2024-02-07 VITALS — BP 101/66 | HR 52 | Temp 98.0°F | Ht 65.0 in | Wt 165.0 lb

## 2024-02-07 DIAGNOSIS — E7849 Other hyperlipidemia: Secondary | ICD-10-CM

## 2024-02-07 DIAGNOSIS — E038 Other specified hypothyroidism: Secondary | ICD-10-CM

## 2024-02-07 DIAGNOSIS — Z6826 Body mass index (BMI) 26.0-26.9, adult: Secondary | ICD-10-CM

## 2024-02-07 DIAGNOSIS — Z0000 Encounter for general adult medical examination without abnormal findings: Secondary | ICD-10-CM

## 2024-02-07 DIAGNOSIS — E559 Vitamin D deficiency, unspecified: Secondary | ICD-10-CM | POA: Diagnosis not present

## 2024-02-07 DIAGNOSIS — Z131 Encounter for screening for diabetes mellitus: Secondary | ICD-10-CM

## 2024-02-07 DIAGNOSIS — E669 Obesity, unspecified: Secondary | ICD-10-CM | POA: Diagnosis not present

## 2024-02-07 DIAGNOSIS — Z6827 Body mass index (BMI) 27.0-27.9, adult: Secondary | ICD-10-CM | POA: Diagnosis not present

## 2024-02-07 DIAGNOSIS — E039 Hypothyroidism, unspecified: Secondary | ICD-10-CM

## 2024-02-07 MED ORDER — VITAMIN D (ERGOCALCIFEROL) 1.25 MG (50000 UNIT) PO CAPS: 50000.0000 [IU] | ORAL_CAPSULE | ORAL | 0 refills | Status: DC

## 2024-02-07 MED ORDER — VITAMIN D (ERGOCALCIFEROL) 1.25 MG (50000 UNIT) PO CAPS: 50000.0000 [IU] | ORAL_CAPSULE | ORAL | 1 refills | Status: DC

## 2024-02-07 NOTE — Progress Notes (Signed)
 WEIGHT SUMMARY AND BIOMETRICS  Vitals Temp: 98 F (36.7 C) BP: 101/66 Pulse Rate: (!) 52 SpO2: 99 %   Anthropometric Measurements Height: 5' 5 (1.651 m) Weight: 165 lb (74.8 kg) BMI (Calculated): 27.46 Weight at Last Visit: 160 lb Weight Lost Since Last Visit: 0 Weight Gained Since Last Visit: 5 lb Starting Weight: 217 lb Total Weight Loss (lbs): 52 lb (23.6 kg)   Body Composition  Body Fat %: 37.9 % Fat Mass (lbs): 62.6 lbs Muscle Mass (lbs): 97.6 lbs Total Body Water (lbs): 72.8 lbs Visceral Fat Rating : 8   Other Clinical Data Fasting: no Labs: no Today's Visit #: 22 Starting Date: 10/01/19    Chief Complaint:   OBESITY Karen Gross is here to discuss her progress with her obesity treatment plan.  She is on the the Category 2 Plan and states she is following her eating plan approximately 40 % of the time.  She states she is exercising: NEAT Activities  Interim History:  She started [facility] 10/01/2019, weight 217 lbs with correspondong BMI 35 Started Mx Phase on 03/11/2020, weight 185 lbs with corresponding BMI 29   Today weight 165 lbs with corresponding BMI 27.5. Visceral Adipose Rating at 8: at goal   She was followed every 3-5 months and recently requested to adjust to f/u Q6W  Reviewed Bioimpedance Results with pt: Muscle Mass: + 3 lbs Adipose Mass: +1.4 lbs  Subjective:   1. Other specified hypothyroidism  Latest Reference Range & Units 02/22/23 08:41 07/23/23 08:29 11/07/23 08:16  TSH 0.450 - 4.500 uIU/mL 1.890 3.440 4.630 (H)  Triiodothyronine,Free,Serum 2.0 - 4.4 pg/mL 2.8    T4,Free(Direct) 0.82 - 1.77 ng/dL 8.03 (H)  7.84 (H)  (H): Data is abnormally high  PCP currently manages  levothyroxine  (SYNTHROID ) 112 MCG tablet TAKE 1 TABLET ON SUNDAY, TUESDAY, WEDNESDAY, FRIDAY AND SATURDAY IN THE MORNING BEFORE BREAKFAST. Dispense: 65 tablet, Refills: 1 ordered   07/30/2023 -- Wallace Search A, PA            []   levothyroxine  (SYNTHROID ) 100  MCG tablet TAKE 1 TABLET BY MOUTH ON MONDAYS AND THURSDAYS ONLY, TAKE IN THE MORNING BEFORE BREAKFAST Dispense: 12 tablet, Refills: 3 ordered              2. Healthcare maintenance She endorses increased stress and fatigue. She and her family are moving homes today. She is also working concessions at pool over the summer. She reports excellent local support system. Her daughter will be moving to [place] in August Her son is in [place]  and will move back to [place]  Sept 2025- Jan 2026  3. Vitamin D  deficiency  Latest Reference Range & Units 12/04/22 08:05 01/24/23 09:22 10/02/23 08:09  Vitamin D , 25-Hydroxy 30.0 - 100.0 ng/mL 63.9 68.9 70.9   She is on weekly Ergocalciferol -she denies N/V/Muscle Weakness  Assessment/Plan:   1. Other specified hypothyroidism Check Labs -TSH -Free T4 F/u with [facility] PCP  2. Healthcare maintenance (Primary) F/u with [facility] PCP  3. Vitamin D  deficiency Check Labs -Vit D Level -Refill  5. BMI 26.0-26.9,adult Current BMI 27.5  Karen Gross is currently in the action stage of change. As such, her goal is to maintain weight for now. She has agreed to the Category 2 Plan.   Exercise goals: All adults should avoid inactivity. Some physical activity is better than none, and adults who participate in any amount of physical activity gain some health benefits. Adults should also include muscle-strengthening activities that  involve all major muscle groups on 2 or more days a week.  Behavioral modification strategies: increasing lean protein intake, decreasing simple carbohydrates, increasing vegetables, increasing water intake, decreasing eating out, no skipping meals, meal planning and cooking strategies, keeping healthy foods in the home, and planning for success.  Karen Gross has agreed to follow-up with our clinic in 8 weeks. She was informed of the importance of frequent follow-up visits to maximize her success with intensive lifestyle  modifications for her multiple health conditions.   Karen Gross was informed we would discuss her lab results at her next visit unless there is a critical issue that needs to be addressed sooner. Karen Gross agreed to keep her next visit at the agreed upon time to discuss these results.  Objective:   Blood pressure 101/66, pulse (!) 52, temperature 98 F (36.7 C), height 5' 5 (1.651 m), weight 165 lb (74.8 kg), SpO2 99%. Body mass index is 27.46 kg/m.  General: Cooperative, alert, well developed, in no acute distress. HEENT: Conjunctivae and lids unremarkable. Cardiovascular: Regular rhythm.  Lungs: Normal work of breathing. Neurologic: No focal deficits.   Lab Results  Component Value Date   CREATININE 0.87 11/07/2023   BUN 21 11/07/2023   NA 144 11/07/2023   K 4.1 11/07/2023   CL 107 (H) 11/07/2023   CO2 20 11/07/2023   Lab Results  Component Value Date   ALT 20 11/07/2023   AST 18 11/07/2023   ALKPHOS 89 11/07/2023   BILITOT 0.4 11/07/2023   Lab Results  Component Value Date   HGBA1C 5.0 11/07/2023   HGBA1C 5.2 07/23/2023   HGBA1C 5.0 01/24/2023   HGBA1C 5.1 12/04/2022   HGBA1C 5.1 08/01/2022   Lab Results  Component Value Date   INSULIN  5.1 11/07/2023   INSULIN  5.8 12/04/2022   INSULIN  6.5 10/01/2019   Lab Results  Component Value Date   TSH 4.630 (H) 11/07/2023   Lab Results  Component Value Date   CHOL 197 07/23/2023   HDL 72 07/23/2023   LDLCALC 117 (H) 07/23/2023   TRIG 41 07/23/2023   CHOLHDL 2.7 07/23/2023   Lab Results  Component Value Date   VD25OH 70.9 10/02/2023   VD25OH 68.9 01/24/2023   VD25OH 63.9 12/04/2022   Lab Results  Component Value Date   WBC 4.3 07/23/2023   HGB 15.7 07/23/2023   HCT 47.6 (H) 07/23/2023   MCV 94 07/23/2023   PLT 220 07/23/2023   Lab Results  Component Value Date   FERRITIN 24 10/04/2020   Attestation Statements:   Reviewed by clinician on day of visit: allergies, medications, problem list, medical  history, surgical history, family history, social history, and previous encounter notes.  I have reviewed the above documentation for accuracy and completeness, and I agree with the above. -  Sinai Mahany d. Ustin Cruickshank, NP-C

## 2024-02-08 ENCOUNTER — Other Ambulatory Visit

## 2024-02-08 DIAGNOSIS — E039 Hypothyroidism, unspecified: Secondary | ICD-10-CM

## 2024-02-08 DIAGNOSIS — E7849 Other hyperlipidemia: Secondary | ICD-10-CM

## 2024-02-08 DIAGNOSIS — Z131 Encounter for screening for diabetes mellitus: Secondary | ICD-10-CM

## 2024-02-08 LAB — TSH+FREE T4
Free T4: 1.68 ng/dL (ref 0.82–1.77)
TSH: 2.27 u[IU]/mL (ref 0.450–4.500)

## 2024-02-08 LAB — VITAMIN D 25 HYDROXY (VIT D DEFICIENCY, FRACTURES): Vit D, 25-Hydroxy: 63.4 ng/mL (ref 30.0–100.0)

## 2024-02-26 ENCOUNTER — Encounter

## 2024-04-03 ENCOUNTER — Ambulatory Visit (INDEPENDENT_AMBULATORY_CARE_PROVIDER_SITE_OTHER): Admitting: Adult Health

## 2024-04-03 ENCOUNTER — Encounter (INDEPENDENT_AMBULATORY_CARE_PROVIDER_SITE_OTHER): Payer: Self-pay | Admitting: Adult Health

## 2024-04-03 VITALS — BP 95/65 | HR 69 | Ht 65.0 in | Wt 164.0 lb

## 2024-04-03 DIAGNOSIS — E7849 Other hyperlipidemia: Secondary | ICD-10-CM | POA: Diagnosis not present

## 2024-04-03 DIAGNOSIS — Z0000 Encounter for general adult medical examination without abnormal findings: Secondary | ICD-10-CM

## 2024-04-03 DIAGNOSIS — E039 Hypothyroidism, unspecified: Secondary | ICD-10-CM | POA: Diagnosis not present

## 2024-04-03 DIAGNOSIS — E669 Obesity, unspecified: Secondary | ICD-10-CM | POA: Diagnosis not present

## 2024-04-03 DIAGNOSIS — Z6826 Body mass index (BMI) 26.0-26.9, adult: Secondary | ICD-10-CM

## 2024-04-03 DIAGNOSIS — Z6827 Body mass index (BMI) 27.0-27.9, adult: Secondary | ICD-10-CM

## 2024-04-03 DIAGNOSIS — E559 Vitamin D deficiency, unspecified: Secondary | ICD-10-CM | POA: Diagnosis not present

## 2024-04-03 MED ORDER — VITAMIN D (ERGOCALCIFEROL) 1.25 MG (50000 UNIT) PO CAPS: 50000.0000 [IU] | ORAL_CAPSULE | ORAL | 0 refills | Status: DC

## 2024-04-03 NOTE — Progress Notes (Signed)
 WEIGHT SUMMARY AND BIOMETRICS  Vitals BP: 95/65 Pulse Rate: 69 SpO2: 99 %   Anthropometric Measurements Height: 5' 5 (1.651 m) Weight: 164 lb (74.4 kg) BMI (Calculated): 27.29 Weight at Last Visit: 165lb Weight Lost Since Last Visit: 1lb Weight Gained Since Last Visit: 0lb Starting Weight: 217lb Total Weight Loss (lbs): 53 lb (24 kg)   Body Composition  Body Fat %: 32.8 % Fat Mass (lbs): 53.8 lbs Muscle Mass (lbs): 104.6 lbs Total Body Water (lbs): 67.4 lbs Visceral Fat Rating : 7   Other Clinical Data Fasting: yes Labs: no Today's Visit #: 23 Starting Date: 10/01/19    Chief Complaint:   OBESITY Karen Gross is here to discuss her progress with her obesity treatment plan.  She is on the the Category 2 Plan and states she is following her eating plan approximately 40 % of the time.  She states she is exercising Walking 45-60 minutes 4 times per week.  Interim History:  Karen Gross is followed by [facility] Q8W Her son playing well in [facility]- he will move home at end of season in two weeks. Karen Gross and Karen Gross recently moved homes and her son's girlfriend Waddell moved in last week. Her daughter, Karen Gross, is settling in well at [facility]! 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  Subjective:   1. Healthcare maintenance Lab Results  Component Value Date   HGBA1C 5.0 11/07/2023   HGBA1C 5.2 07/23/2023   HGBA1C 5.0 01/24/2023     Latest Reference Range & Units 12/04/22 08:05 11/07/23 08:16  INSULIN  2.6 - 24.9 uIU/mL 5.8 5.1   She is not on any antidiabetic medications She endorses stable appetite  2. Vitamin D  deficiency  Latest Reference Range &  Units 01/24/23 09:22 10/02/23 08:09 02/07/24 14:43  Vitamin D , 25-Hydroxy 30.0 - 100.0 ng/mL 68.9 70.9 63.4   She is on weekly Ergocalciferol - denies N/V/Muscle Weakness She endorses stable energy levels  3. Other hyperlipidemia  Latest Reference Range & Units 11/07/23 08:16  Alkaline Phosphatase 44 - 121 IU/L 89  Albumin 3.8 - 4.9 g/dL 4.4  AST 0 - 40 IU/L 18  ALT 0 - 32 IU/L 20    4. Hypothyroidism, unspecified type PCP manages Levothyroxine  100mcg Monday, Thursday 112 mcg Tuesday, Wednesday, Friday, Saturday, Sunday  She endorses stable energy levels  Assessment/Plan:   1. Healthcare maintenance Check Labs - Hemoglobin A1c - Insulin , random - Vitamin B12  2. Vitamin D  deficiency Check Labs - VITAMIN D  25 Hydroxy (Vit-D Deficiency, Fractures) Refill Vitamin D , Ergocalciferol , (DRISDOL ) 1.25 MG (50000 UNIT) CAPS capsule Take 1 capsule (50,000 Units total) by mouth once a week. Dispense: 12 capsule, Refills: 0 ordered   3. Other hyperlipidemia (Primary) Check Labs - Comprehensive metabolic panel with GFR - Lipid panel  4. Hypothyroidism, unspecified type Continue Levothyroxine  per PCP instructions  5. BMI 26.0-26.9,adult Current BMI 27.3  Karen Gross is currently in the action stage of change. As such, her goal is to continue with weight loss efforts. She has agreed to the Category 2 Plan.   Exercise goals: For substantial health benefits, adults should do at least 150 minutes (2 hours and 30 minutes) a week of moderate-intensity, or  75 minutes (1 hour and 15 minutes) a week of vigorous-intensity aerobic physical activity, or an equivalent combination of moderate- and vigorous-intensity aerobic activity. Aerobic activity should be performed in episodes of at least 10 minutes, and preferably, it should be spread throughout the week.  Behavioral modification strategies: increasing lean protein intake, decreasing simple carbohydrates, increasing vegetables, increasing water  intake, meal planning and cooking strategies, keeping healthy foods in the home, ways to avoid boredom eating, ways to avoid night time snacking, and planning for success.  Karen Gross has agreed to follow-up with our clinic in 8 weeks. She was informed of the importance of frequent follow-up visits to maximize her success with intensive lifestyle modifications for her multiple health conditions.   Karen Gross was informed we would discuss her lab results at her next visit unless there is a critical issue that needs to be addressed sooner. Karen Gross agreed to keep her next visit at the agreed upon time to discuss these results.  Objective:   Blood pressure 95/65, pulse 69, height 5' 5 (1.651 m), weight 164 lb (74.4 kg), SpO2 99%. Body mass index is 27.29 kg/m.  General: Cooperative, alert, well developed, in no acute distress. HEENT: Conjunctivae and lids unremarkable. Cardiovascular: Regular rhythm.  Lungs: Normal work of breathing. Neurologic: No focal deficits.   Lab Results  Component Value Date   CREATININE 0.87 11/07/2023   BUN 21 11/07/2023   NA 144 11/07/2023   K 4.1 11/07/2023   CL 107 (H) 11/07/2023   CO2 20 11/07/2023   Lab Results  Component Value Date   ALT 20 11/07/2023   AST 18 11/07/2023   ALKPHOS 89 11/07/2023   BILITOT 0.4 11/07/2023   Lab Results  Component Value Date   HGBA1C 5.0 11/07/2023   HGBA1C 5.2 07/23/2023   HGBA1C 5.0 01/24/2023   HGBA1C 5.1 12/04/2022   HGBA1C 5.1 08/01/2022   Lab Results  Component Value Date   INSULIN  5.1 11/07/2023   INSULIN  5.8 12/04/2022   INSULIN  6.5 10/01/2019   Lab Results  Component Value Date   TSH 2.270 02/07/2024   Lab Results  Component Value Date   CHOL 197 07/23/2023   HDL 72 07/23/2023   LDLCALC 117 (H) 07/23/2023   TRIG 41 07/23/2023   CHOLHDL 2.7 07/23/2023   Lab Results  Component Value Date   VD25OH 63.4 02/07/2024   VD25OH 70.9 10/02/2023   VD25OH 68.9 01/24/2023   Lab Results  Component  Value Date   WBC 4.3 07/23/2023   HGB 15.7 07/23/2023   HCT 47.6 (H) 07/23/2023   MCV 94 07/23/2023   PLT 220 07/23/2023   Lab Results  Component Value Date   FERRITIN 24 10/04/2020   Attestation Statements:   Reviewed by clinician on day of visit: allergies, medications, problem list, medical history, surgical history, family history, social history, and previous encounter notes.  I have reviewed the above documentation for accuracy and completeness, and I agree with the above. -  Chance Karam d. Paul Torpey,NP-C

## 2024-04-04 ENCOUNTER — Other Ambulatory Visit: Payer: Self-pay

## 2024-04-04 DIAGNOSIS — E559 Vitamin D deficiency, unspecified: Secondary | ICD-10-CM

## 2024-04-04 DIAGNOSIS — E039 Hypothyroidism, unspecified: Secondary | ICD-10-CM

## 2024-04-04 DIAGNOSIS — E7849 Other hyperlipidemia: Secondary | ICD-10-CM

## 2024-04-04 DIAGNOSIS — Z6826 Body mass index (BMI) 26.0-26.9, adult: Secondary | ICD-10-CM

## 2024-04-05 LAB — LIPID PANEL WITH LDL/HDL RATIO
Cholesterol, Total: 196 mg/dL (ref 100–199)
HDL: 65 mg/dL (ref >39)
LDL Chol Calc (NIH): 120 mg/dL — ABNORMAL HIGH (ref 0–99)
LDL/HDL Ratio: 1.8 ratio (ref 0.0–3.2)
Triglycerides: 61 mg/dL (ref 0–149)
VLDL Cholesterol Cal: 11 mg/dL (ref 5–40)

## 2024-04-05 LAB — COMPREHENSIVE METABOLIC PANEL WITH GFR
ALT: 13 IU/L (ref 0–32)
AST: 17 IU/L (ref 0–40)
Albumin: 4.3 g/dL (ref 3.8–4.9)
Alkaline Phosphatase: 74 IU/L (ref 44–121)
BUN/Creatinine Ratio: 25 — ABNORMAL HIGH (ref 9–23)
BUN: 20 mg/dL (ref 6–24)
Bilirubin Total: 0.4 mg/dL (ref 0.0–1.2)
CO2: 20 mmol/L (ref 20–29)
Calcium: 9.2 mg/dL (ref 8.7–10.2)
Chloride: 108 mmol/L — ABNORMAL HIGH (ref 96–106)
Creatinine, Ser: 0.81 mg/dL (ref 0.57–1.00)
Globulin, Total: 2 g/dL (ref 1.5–4.5)
Glucose: 91 mg/dL (ref 70–99)
Potassium: 4.1 mmol/L (ref 3.5–5.2)
Sodium: 142 mmol/L (ref 134–144)
Total Protein: 6.3 g/dL (ref 6.0–8.5)
eGFR: 85 mL/min/1.73 (ref >59)

## 2024-04-05 LAB — HEMOGLOBIN A1C
Est. average glucose Bld gHb Est-mCnc: 97 mg/dL
Hgb A1c MFr Bld: 5 % (ref 4.8–5.6)

## 2024-04-05 LAB — INSULIN, RANDOM: INSULIN: 6.2 u[IU]/mL (ref 2.6–24.9)

## 2024-04-05 LAB — VITAMIN D 25 HYDROXY (VIT D DEFICIENCY, FRACTURES): Vit D, 25-Hydroxy: 68.3 ng/mL (ref 30.0–100.0)

## 2024-04-05 LAB — VITAMIN B12: Vitamin B-12: 391 pg/mL (ref 232–1245)

## 2024-04-07 ENCOUNTER — Other Ambulatory Visit

## 2024-04-07 DIAGNOSIS — E7849 Other hyperlipidemia: Secondary | ICD-10-CM

## 2024-04-07 DIAGNOSIS — Z6826 Body mass index (BMI) 26.0-26.9, adult: Secondary | ICD-10-CM

## 2024-04-07 DIAGNOSIS — E559 Vitamin D deficiency, unspecified: Secondary | ICD-10-CM

## 2024-04-08 ENCOUNTER — Ambulatory Visit: Payer: Self-pay

## 2024-04-08 LAB — CBC WITH DIFFERENTIAL/PLATELET
Basophils Absolute: 0 x10E3/uL (ref 0.0–0.2)
Basos: 1 %
EOS (ABSOLUTE): 0.1 x10E3/uL (ref 0.0–0.4)
Eos: 2 %
Hematocrit: 46.4 % (ref 34.0–46.6)
Hemoglobin: 14.9 g/dL (ref 11.1–15.9)
Immature Grans (Abs): 0 x10E3/uL (ref 0.0–0.1)
Immature Granulocytes: 0 %
Lymphocytes Absolute: 1.4 x10E3/uL (ref 0.7–3.1)
Lymphs: 31 %
MCH: 31 pg (ref 26.6–33.0)
MCHC: 32.1 g/dL (ref 31.5–35.7)
MCV: 97 fL (ref 79–97)
Monocytes Absolute: 0.4 x10E3/uL (ref 0.1–0.9)
Monocytes: 8 %
Neutrophils Absolute: 2.7 x10E3/uL (ref 1.4–7.0)
Neutrophils: 58 %
Platelets: 222 x10E3/uL (ref 150–450)
RBC: 4.81 x10E6/uL (ref 3.77–5.28)
RDW: 11.9 % (ref 11.7–15.4)
WBC: 4.6 x10E3/uL (ref 3.4–10.8)

## 2024-04-08 LAB — COMPREHENSIVE METABOLIC PANEL WITH GFR
ALT: 14 IU/L (ref 0–32)
AST: 18 IU/L (ref 0–40)
Albumin: 4.3 g/dL (ref 3.8–4.9)
Alkaline Phosphatase: 82 IU/L (ref 44–121)
BUN/Creatinine Ratio: 22 (ref 9–23)
BUN: 17 mg/dL (ref 6–24)
Bilirubin Total: 0.4 mg/dL (ref 0.0–1.2)
CO2: 21 mmol/L (ref 20–29)
Calcium: 9.2 mg/dL (ref 8.7–10.2)
Chloride: 109 mmol/L — ABNORMAL HIGH (ref 96–106)
Creatinine, Ser: 0.79 mg/dL (ref 0.57–1.00)
Globulin, Total: 2 g/dL (ref 1.5–4.5)
Glucose: 86 mg/dL (ref 70–99)
Potassium: 4.2 mmol/L (ref 3.5–5.2)
Sodium: 143 mmol/L (ref 134–144)
Total Protein: 6.3 g/dL (ref 6.0–8.5)
eGFR: 88 mL/min/1.73 (ref >59)

## 2024-04-08 LAB — HEMOGLOBIN A1C
Est. average glucose Bld gHb Est-mCnc: 94 mg/dL
Hgb A1c MFr Bld: 4.9 % (ref 4.8–5.6)

## 2024-04-08 LAB — LIPID PANEL
Chol/HDL Ratio: 3 ratio (ref 0.0–4.4)
Cholesterol, Total: 191 mg/dL (ref 100–199)
HDL: 63 mg/dL (ref >39)
LDL Chol Calc (NIH): 108 mg/dL — ABNORMAL HIGH (ref 0–99)
Triglycerides: 114 mg/dL (ref 0–149)
VLDL Cholesterol Cal: 20 mg/dL (ref 5–40)

## 2024-04-08 LAB — VITAMIN D 25 HYDROXY (VIT D DEFICIENCY, FRACTURES): Vit D, 25-Hydroxy: 67.8 ng/mL (ref 30.0–100.0)

## 2024-04-08 LAB — TSH: TSH: 2.81 u[IU]/mL (ref 0.450–4.500)

## 2024-04-11 ENCOUNTER — Ambulatory Visit (INDEPENDENT_AMBULATORY_CARE_PROVIDER_SITE_OTHER)

## 2024-04-11 VITALS — BP 98/65 | HR 68 | Temp 97.6°F | Ht 65.0 in | Wt 173.1 lb

## 2024-04-11 DIAGNOSIS — E559 Vitamin D deficiency, unspecified: Secondary | ICD-10-CM

## 2024-04-11 DIAGNOSIS — F339 Major depressive disorder, recurrent, unspecified: Secondary | ICD-10-CM | POA: Diagnosis not present

## 2024-04-11 DIAGNOSIS — G8929 Other chronic pain: Secondary | ICD-10-CM

## 2024-04-11 DIAGNOSIS — Z23 Encounter for immunization: Secondary | ICD-10-CM

## 2024-04-11 DIAGNOSIS — M546 Pain in thoracic spine: Secondary | ICD-10-CM

## 2024-04-11 DIAGNOSIS — Z0000 Encounter for general adult medical examination without abnormal findings: Secondary | ICD-10-CM | POA: Insufficient documentation

## 2024-04-11 DIAGNOSIS — E038 Other specified hypothyroidism: Secondary | ICD-10-CM | POA: Diagnosis not present

## 2024-04-11 DIAGNOSIS — G40909 Epilepsy, unspecified, not intractable, without status epilepticus: Secondary | ICD-10-CM | POA: Diagnosis not present

## 2024-04-11 DIAGNOSIS — K219 Gastro-esophageal reflux disease without esophagitis: Secondary | ICD-10-CM

## 2024-04-11 DIAGNOSIS — E7800 Pure hypercholesterolemia, unspecified: Secondary | ICD-10-CM

## 2024-04-11 MED ORDER — LEVOTHYROXINE SODIUM 100 MCG PO TABS: ORAL_TABLET | ORAL | 3 refills | Status: AC

## 2024-04-11 MED ORDER — LEVOTHYROXINE SODIUM 112 MCG PO TABS: ORAL_TABLET | ORAL | 1 refills | Status: AC

## 2024-04-11 MED ORDER — GABAPENTIN 100 MG PO CAPS: 200.0000 mg | ORAL_CAPSULE | Freq: Every day | ORAL | 3 refills | Status: DC

## 2024-04-11 NOTE — Assessment & Plan Note (Signed)
 Stable, on depakote

## 2024-04-11 NOTE — Assessment & Plan Note (Signed)
 Routine wellness visit with normal labs except mild chloride elevation. Improved cholesterol levels. No diabetes risk. Normal thyroid  and vitamin D . - Continue current medications and supplements. - Encourage increased water intake. - Administer flu vaccine. - Administer second dose of shingles vaccine. - Schedule six-month follow-up.

## 2024-04-11 NOTE — Assessment & Plan Note (Signed)
 Stable. PHQ9 and GAD7 0. Continue abilify  10 mg every day. Will cont to monitor.

## 2024-04-11 NOTE — Assessment & Plan Note (Signed)
 Last lipid panel: LDL 108, HDL 63, Trig 114. The 10-year ASCVD risk score (Arnett DK, et al., 2019) is: 1.1% Continue with weight loss efforts to improve LDL. Will cont to monitor.

## 2024-04-11 NOTE — Assessment & Plan Note (Signed)
 Stable with omeprazole  20 mg as needed.

## 2024-04-11 NOTE — Patient Instructions (Signed)
 VISIT SUMMARY: Today, you came in for your annual physical exam. We reviewed your lab results, health screenings, and medications. You mentioned mild soreness after a student fell on you, but otherwise, you had no major complaints. We discussed your vision, recent increase in runny nose, and vaccinations. Your lab results showed a slight elevation in chloride, improved cholesterol levels, and normal thyroid  and vitamin D  levels. You have successfully managed your weight loss and are maintaining it well. We also reviewed your current medications and made some adjustments.  YOUR PLAN: -ADULT WELLNESS VISIT: This visit was a routine check-up to review your overall health. Your lab results were mostly normal, with a slight elevation in chloride. Your cholesterol levels have improved, and there is no risk of diabetes. Your thyroid  and vitamin D  levels are normal. Continue your current medications and supplements, increase your water intake, and receive your flu and second shingles vaccines today. We will schedule a follow-up in six months.  -CHRONIC BACK PAIN: Chronic back pain is ongoing pain in your back. We will refill your gabapentin  200 mg to be taken at bedtime to help manage this pain.  -OBESITY: Obesity is a condition where you have excess body fat. You have managed this well with significant weight loss through diet and exercise. Continue with your weight management plan and follow up with the [facility] program. We will schedule a follow-up in six months.  -HYPERLIPIDEMIA: Hyperlipidemia is having high levels of fats (lipids) in your blood. Your LDL cholesterol has improved, so no additional medication is needed. Continue with your diet and exercise, and we will monitor your cholesterol levels.  -HYPOTHYROIDISM: Hypothyroidism is when your thyroid  gland doesn't produce enough thyroid  hormone. Continue taking your levothyroxine  with alternating doses of 100 mcg and 112 mcg. We will  refill your prescription.  -GASTROESOPHAGEAL REFLUX DISEASE (GERD): GERD is a condition where stomach acid frequently flows back into the tube connecting your mouth and stomach. Continue managing this with omeprazole.  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 Continue to use valacyclovir  as needed.  INSTRUCTIONS: Please follow up in six months for a routine check-up. Continue with your current medications and supplements, and increase your water intake. Receive your flu and second shingles vaccines today. We will monitor your cholesterol levels and verify your prescriber for aripiprazole  and Depakote.  If you have any problems before your next visit feel free to message me via MyChart (minor issues or questions) or call the office, otherwise you may reach out to schedule an office visit.  Thank you! Saddie Sacks, PA-C

## 2024-04-11 NOTE — Assessment & Plan Note (Signed)
 TSH within normal limits.  Continue levothyroxine 100 mcg on Mondays and Thursdays, levothyroxine 112 mcg on Sundays/Tuesdays/Wednesdays/Fridays/Saturdays.  Will continue to monitor.

## 2024-04-11 NOTE — Assessment & Plan Note (Signed)
 Resolved on vitamin D  supplement

## 2024-04-11 NOTE — Progress Notes (Signed)
 Complete physical exam  Patient: Karen Gross   DOB: 05/19/1966   58 y.o. Female  MRN: [id number]  Subjective:    Chief Complaint  Patient presents with   Annual Exam    Physical   History of Present Illness   Karen Gross is a 58 year old female who presents for an annual physical exam.   General health maintenance - Present for annual physical examination including review of laboratory results, health screenings, and medication refills - No current complaints except for mild soreness after a student fell on her - No recent eye examination; uses [employer] for vision - Received first shingles vaccine previously; due for second dose today - Plans to receive influenza vaccination  Weight management - Nearly 60-pound weight loss through diet and exercise - Attends [facility] program every eight weeks (previously weekly) - Currently maintaining weight loss  Musculoskeletal symptoms - Mild soreness after a student had a seizure and fell on her yesterday - No other musculoskeletal complaints  Gynecologic history - History of hysterectomy with ovarian conservation - No need for Pap smears   Most recent fall risk assessment:    04/11/2024    8:30 AM  Fall Risk   Falls in the past year? 0  Risk for fall due to : No Fall Risks  Follow up Falls evaluation completed     Most recent depression screenings:    04/11/2024    8:30 AM 07/30/2023    9:35 AM  PHQ 2/9 Scores  PHQ - 2 Score 0 0  PHQ- 9 Score 0 0    Vision:Not within last year  and Dental: No current dental problems and Receives regular dental care    Patient Care Team: Gayle Saddie JULIANNA DEVONNA as PCP - General (Physician Assistant) Shona Rush, MD (Dermatology) Delana Ted Morrison, DO (Obstetrics and Gynecology)   Outpatient Medications Prior to Visit  Medication Sig   ARIPiprazole  (ABILIFY ) 10 MG tablet Take 10 mg by mouth every morning.   celecoxib (CELEBREX) 200 MG capsule Take 200  mg by mouth 2 (two) times daily.   divalproex (DEPAKOTE ER) 500 MG 24 hr tablet Take 500 mg by mouth at bedtime.   omeprazole (PRILOSEC) 20 MG capsule Take 20 mg by mouth daily.   topiramate  (TOPAMAX ) 100 MG tablet Take 100 mg by mouth 2 (two) times daily.   valACYclovir  (VALTREX ) 1000 MG tablet TAKE 1 TABLET BY MOUTH 2 TIMES DAILY.   Vitamin D , Ergocalciferol , (DRISDOL ) 1.25 MG (50000 UNIT) CAPS capsule Take 1 capsule (50,000 Units total) by mouth once a week.   [DISCONTINUED] levothyroxine  (SYNTHROID ) 100 MCG tablet TAKE 1 TABLET BY MOUTH ON MONDAYS AND THURSDAYS ONLY, TAKE IN THE MORNING BEFORE BREAKFAST   [DISCONTINUED] levothyroxine  (SYNTHROID ) 112 MCG tablet TAKE 1 TABLET ON SUNDAY, TUESDAY, WEDNESDAY, FRIDAY AND SATURDAY IN THE MORNING BEFORE BREAKFAST.   [DISCONTINUED] meloxicam  (MOBIC ) 15 MG tablet Take 1 tablet (15 mg total) by mouth daily.   [DISCONTINUED] gabapentin  (NEURONTIN ) 100 MG capsule TAKE 2 CAPSULES BY MOUTH AT BEDTIME. (Patient not taking: Reported on 04/11/2024)   No facility-administered medications prior to visit.    ROS  Per HPI      Objective:     BP 98/65   Pulse 68   Temp 97.6 F (36.4 C) (Oral)   Ht 5' 5 (1.651 m)   Wt 173 lb 1.9 oz (78.5 kg)   SpO2 96%   BMI 28.81 kg/m    Physical Exam  Constitutional:      General: She is not in acute distress.    Appearance: Normal appearance.  HENT:     Right Ear: Tympanic membrane normal.     Left Ear: Tympanic membrane normal.     Mouth/Throat:     Mouth: Mucous membranes are moist.     Pharynx: Oropharynx is clear.  Eyes:     Pupils: Pupils are equal, round, and reactive to light.  Cardiovascular:     Rate and Rhythm: Normal rate and regular rhythm.     Heart sounds: Normal heart sounds. No murmur heard.    No friction rub. No gallop.  Pulmonary:     Effort: Pulmonary effort is normal. No respiratory distress.     Breath sounds: Normal breath sounds.  Abdominal:     General: Abdomen is flat.  Bowel sounds are normal.     Palpations: Abdomen is soft.  Musculoskeletal:        General: No swelling.     Cervical back: Normal range of motion.  Lymphadenopathy:     Cervical: No cervical adenopathy.  Skin:    General: Skin is warm and dry.  Neurological:     General: No focal deficit present.     Mental Status: She is alert.  Psychiatric:        Mood and Affect: Mood normal.        Behavior: Behavior normal.        Thought Content: Thought content normal.     No results found for any visits on 04/11/24. Last CBC Lab Results  Component Value Date   WBC 4.6 04/07/2024   HGB 14.9 04/07/2024   HCT 46.4 04/07/2024   MCV 97 04/07/2024   MCH 31.0 04/07/2024   RDW 11.9 04/07/2024   PLT 222 04/07/2024   Last metabolic panel Lab Results  Component Value Date   GLUCOSE 86 04/07/2024   NA 143 04/07/2024   K 4.2 04/07/2024   CL 109 (H) 04/07/2024   CO2 21 04/07/2024   BUN 17 04/07/2024   CREATININE 0.79 04/07/2024   EGFR 88 04/07/2024   CALCIUM 9.2 04/07/2024   PROT 6.3 04/07/2024   ALBUMIN 4.3 04/07/2024   LABGLOB 2.0 04/07/2024   AGRATIO 2.3 (H) 12/04/2022   BILITOT 0.4 04/07/2024   ALKPHOS 82 04/07/2024   AST 18 04/07/2024   ALT 14 04/07/2024   ANIONGAP 10 08/21/2021   Last lipids Lab Results  Component Value Date   CHOL 191 04/07/2024   HDL 63 04/07/2024   LDLCALC 108 (H) 04/07/2024   TRIG 114 04/07/2024   CHOLHDL 3.0 04/07/2024   Last hemoglobin A1c Lab Results  Component Value Date   HGBA1C 4.9 04/07/2024   Last thyroid  functions Lab Results  Component Value Date   TSH 2.810 04/07/2024   T3TOTAL 83 08/01/2022   T4TOTAL 11.3 02/04/2020   Last vitamin D  Lab Results  Component Value Date   VD25OH 67.8 04/07/2024        Assessment & Plan:    Routine Health Maintenance and Physical Exam  Health Maintenance  Topic Date Due   Hepatitis B Vaccine (1 of 3 - 19+ 3-dose series) Never done   Pneumococcal Vaccine for age over 80 (1 of 1 - PCV)  Never done   Zoster (Shingles) Vaccine (2 of 2) 10/25/2023   Flu Shot  02/29/2024   COVID-19 Vaccine (4 - 2025-26 season) 03/31/2024   Breast Cancer Screening  10/15/2025   Colon Cancer Screening  12/07/2029   DTaP/Tdap/Td vaccine (3 - Td or Tdap) 04/21/2031   Hepatitis C Screening  Completed   HIV Screening  Completed   HPV Vaccine  Aged Out   Meningitis B Vaccine  Aged Out    Discussed health benefits of physical activity, and encouraged her to engage in regular exercise appropriate for her age and condition.  Encounter for vaccination -     Flu vaccine trivalent PF, 6mos and older(Flulaval,Afluria,Fluarix,Fluzone) -     Varicella-zoster vaccine subcutaneous  Other specified hypothyroidism Assessment & Plan: TSH within normal limits. Continue levothyroxine  100 mcg on Mondays and Thursdays, levothyroxine  112 mcg on Sundays/Tuesdays/Wednesdays/Fridays/Saturdays. Will continue to monitor.   Orders: -     Levothyroxine  Sodium; TAKE 1 TABLET ON SUNDAY, TUESDAY, WEDNESDAY, FRIDAY AND SATURDAY IN THE MORNING BEFORE BREAKFAST.  Dispense: 65 tablet; Refill: 1 -     Levothyroxine  Sodium; TAKE 1 TABLET BY MOUTH ON MONDAYS AND THURSDAYS ONLY, TAKE IN THE MORNING BEFORE BREAKFAST  Dispense: 12 tablet; Refill: 3  Seizure disorder (HCC) Assessment & Plan: Stable, on depakote   Vitamin D  deficiency Assessment & Plan: Resolved on vitamin D  supplement   Pure hypercholesterolemia Assessment & Plan: Last lipid panel: LDL 108, HDL 63, Trig 114. The 10-year ASCVD risk score (Arnett DK, et al., 2019) is: 1.1% Continue with weight loss efforts to improve LDL. Will cont to monitor.   Depression, recurrent (HCC) Assessment & Plan: Stable. PHQ9 and GAD7 0. Continue abilify  10 mg every day. Will cont to monitor.   Gastroesophageal reflux disease, unspecified whether esophagitis present Assessment & Plan: Stable with omeprazole 20 mg as needed   Chronic bilateral thoracic back  pain Assessment & Plan: Refill on Gabapentin  200 mg nightly provided for chronic back pain.   Wellness examination Assessment & Plan: Routine wellness visit with normal labs except mild chloride elevation. Improved cholesterol levels. No diabetes risk. Normal thyroid  and vitamin D . - Continue current medications and supplements. - Encourage increased water intake. - Administer flu vaccine. - Administer second dose of shingles vaccine. - Schedule six-month follow-up.   Other orders -     Gabapentin ; Take 2 capsules (200 mg total) by mouth at bedtime.  Dispense: 60 capsule; Refill: 3    Return in about 6 months (around 10/09/2024) for HLD, thyroid .     Saddie JULIANNA Sacks, PA-C

## 2024-04-11 NOTE — Assessment & Plan Note (Signed)
 Refill on Gabapentin  200 mg nightly provided for chronic back pain.

## 2024-05-29 ENCOUNTER — Encounter (INDEPENDENT_AMBULATORY_CARE_PROVIDER_SITE_OTHER): Payer: Self-pay | Admitting: Adult Health

## 2024-05-29 ENCOUNTER — Ambulatory Visit (INDEPENDENT_AMBULATORY_CARE_PROVIDER_SITE_OTHER): Admitting: Adult Health

## 2024-05-29 VITALS — BP 90/59 | HR 75 | Temp 98.2°F | Ht 65.0 in | Wt 164.0 lb

## 2024-05-29 DIAGNOSIS — Z6826 Body mass index (BMI) 26.0-26.9, adult: Secondary | ICD-10-CM

## 2024-05-29 DIAGNOSIS — E038 Other specified hypothyroidism: Secondary | ICD-10-CM

## 2024-05-29 DIAGNOSIS — E559 Vitamin D deficiency, unspecified: Secondary | ICD-10-CM

## 2024-05-29 DIAGNOSIS — Z6827 Body mass index (BMI) 27.0-27.9, adult: Secondary | ICD-10-CM

## 2024-05-29 DIAGNOSIS — F339 Major depressive disorder, recurrent, unspecified: Secondary | ICD-10-CM

## 2024-05-29 DIAGNOSIS — E7800 Pure hypercholesterolemia, unspecified: Secondary | ICD-10-CM

## 2024-05-29 DIAGNOSIS — G40909 Epilepsy, unspecified, not intractable, without status epilepticus: Secondary | ICD-10-CM | POA: Diagnosis not present

## 2024-05-29 DIAGNOSIS — K219 Gastro-esophageal reflux disease without esophagitis: Secondary | ICD-10-CM

## 2024-05-29 DIAGNOSIS — E669 Obesity, unspecified: Secondary | ICD-10-CM

## 2024-05-29 NOTE — Progress Notes (Signed)
 WEIGHT SUMMARY AND BIOMETRICS  Vitals Temp: 98.2 F (36.8 C) BP: (!) 90/59 Pulse Rate: 75 SpO2: 99 %   Anthropometric Measurements Height: 5' 5 (1.651 m) Weight: 164 lb (74.4 kg) BMI (Calculated): 27.29 Weight at Last Visit: 164lb Weight Lost Since Last Visit: 0lb Weight Gained Since Last Visit: 0lb Starting Weight: 217lb Total Weight Loss (lbs): 53 lb (24 kg)   Body Composition  Body Fat %: 32.6 % Fat Mass (lbs): 53.8 lbs Muscle Mass (lbs): 105.4 lbs Total Body Water (lbs): 67.4 lbs Visceral Fat Rating : 7   Other Clinical Data Fasting: Yes Labs: No Today's Visit #: 24 Starting Date: 10/01/19    Chief Complaint:   OBESITY Karen Gross is here to discuss her progress with her obesity treatment plan.  She is on the the Category 2 Plan and states she is following her eating plan approximately 60 % of the time.  She states she is exercising: NEAT Activities  Interim History:  She recently had annual CPE with fasting Labs Sept 2025 She has established with her new PCP at [facility] Karen Sacks, PA-C PCP did not make any medication adjustments.  She denies any acute complaints today.  She denies any recent seizure activity.  Recent Life Events: Slowly renovating new home- major downsize. Heat non operational in home, new heating system is a priority. Her son, Karen Gross, is home from season- RECENTLY ENGAGED. Karen Gross and his cammie Karen Gross, live at home with Karen Gross and Karen Gross. Karen Gross is thriving at [employer] and will be home for Thanksgiving Break. 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  Subjective:   1. Seizure disorder (HCC)  Stable, on depakote  2. Other specified hypothyroidism Discussed Labs  Latest Reference Range & Units 04/07/24 08:52  TSH 0.450 - 4.500 uIU/mL 2.810   TSH therapeutic She denies sx's of hypothyroidism PCP manages Continue levothyroxine  100 mcg on Mondays and Thursdays,  levothyroxine  112 mcg on Sundays/Tuesdays/Wednesdays/Fridays/Saturdays.   3. Pure hypercholesterolemia Discussed Labs Lipid Panel     Component Value Date/Time   CHOL 191 04/07/2024 0852   TRIG 114 04/07/2024 0852   HDL 63 04/07/2024 0852   CHOLHDL 3.0 04/07/2024 0852   LDLCALC 108 (H) 04/07/2024 0852   LABVLDL 20 04/07/2024 0852    Lipid panel improved LDL only slightly above goal  4. Vitamin D  deficiency Dicussed Labs  Latest Reference Range & Units 02/07/24 14:43 04/03/24 08:03 04/07/24 08:52  Vitamin D , 25-Hydroxy 30.0 - 100.0 ng/mL 63.4 68.3 67.8   Vit D Level stable HWW manages weekly Ergocalciferol - she denies N/V/Muscle Weakness  5. Gastroesophageal reflux disease, unspecified whether esophagitis present She reports reglux is well controlled without breakthrough sx's   6. Depression, recurrent Mood stable on daily Abilify  10mg  She denies SI/HI Per recent PCP visit: Stable. PHQ9 and GAD7 0. Continue abilify  10 mg every day. Will cont to monitor   Assessment/Plan:   1. Seizure disorder (HCC) Continue Depakote per PCP  2. Other specified hypothyroidism (Primary) Continue Synthroid  therapy per PCP  3. Pure hypercholesterolemia Continue healthy eating and regular activity Limit sat fat intake  4. Vitamin D  deficiency Continue weekly Ergocalciferol - denies need for refill today  5. Gastroesophageal reflux disease, unspecified whether esophagitis present Avoid known stigger foods Continue daily omeprazole 20mg   6. Depression, recurrent Continue healthy eating and regular activity Continue daily Abilify  10mg   7. BMI 26.0-26.9,adult, CURRENT BMI 27.4  Karen Gross is currently in the action stage of change. As  such, her goal is to maintain weight for now. She has agreed to the Category 2 Plan.   Exercise goals: For substantial health benefits, adults should do at least 150 minutes (2 hours and 30 minutes) a week of moderate-intensity, or 75 minutes (1 hour and  15 minutes) a week of vigorous-intensity aerobic physical activity, or an equivalent combination of moderate- and vigorous-intensity aerobic activity. Aerobic activity should be performed in episodes of at least 10 minutes, and preferably, it should be spread throughout the week.  Behavioral modification strategies: increasing lean protein intake, decreasing simple carbohydrates, increasing vegetables, increasing water intake, no skipping meals, meal planning and cooking strategies, keeping healthy foods in the home, ways to avoid boredom eating, and planning for success.  Karen Gross has agreed to follow-up with our clinic in 12 weeks. She was informed of the importance of frequent follow-up visits to maximize her success with intensive lifestyle modifications for her multiple health conditions.   Objective:   Blood pressure (!) 90/59, pulse 75, temperature 98.2 F (36.8 C), height 5' 5 (1.651 m), weight 164 lb (74.4 kg), SpO2 99%. Body mass index is 27.29 kg/m.  General: Cooperative, alert, well developed, in no acute distress. HEENT: Conjunctivae and lids unremarkable. Cardiovascular: Regular rhythm.  Lungs: Normal work of breathing. Neurologic: No focal deficits.   Lab Results  Component Value Date   CREATININE 0.79 04/07/2024   BUN 17 04/07/2024   NA 143 04/07/2024   K 4.2 04/07/2024   CL 109 (H) 04/07/2024   CO2 21 04/07/2024   Lab Results  Component Value Date   ALT 14 04/07/2024   AST 18 04/07/2024   ALKPHOS 82 04/07/2024   BILITOT 0.4 04/07/2024   Lab Results  Component Value Date   HGBA1C 4.9 04/07/2024   HGBA1C 5.0 04/03/2024   HGBA1C 5.0 11/07/2023   HGBA1C 5.2 07/23/2023   HGBA1C 5.0 01/24/2023   Lab Results  Component Value Date   INSULIN  6.2 04/03/2024   INSULIN  5.1 11/07/2023   INSULIN  5.8 12/04/2022   INSULIN  6.5 10/01/2019   Lab Results  Component Value Date   TSH 2.810 04/07/2024   Lab Results  Component Value Date   CHOL 191 04/07/2024   HDL  63 04/07/2024   LDLCALC 108 (H) 04/07/2024   TRIG 114 04/07/2024   CHOLHDL 3.0 04/07/2024   Lab Results  Component Value Date   VD25OH 67.8 04/07/2024   VD25OH 68.3 04/03/2024   VD25OH 63.4 02/07/2024   Lab Results  Component Value Date   WBC 4.6 04/07/2024   HGB 14.9 04/07/2024   HCT 46.4 04/07/2024   MCV 97 04/07/2024   PLT 222 04/07/2024   Lab Results  Component Value Date   FERRITIN 24 10/04/2020   Attestation Statements:   Reviewed by clinician on day of visit: allergies, medications, problem list, medical history, surgical history, family history, social history, and previous encounter notes.  I have reviewed the above documentation for accuracy and completeness, and I agree with the above. -  Camil Wilhelmsen d. Amylah Will, NP-C

## 2024-07-21 ENCOUNTER — Other Ambulatory Visit (INDEPENDENT_AMBULATORY_CARE_PROVIDER_SITE_OTHER): Payer: Self-pay | Admitting: Adult Health

## 2024-07-22 ENCOUNTER — Encounter: Payer: Self-pay | Admitting: Family Medicine

## 2024-07-22 ENCOUNTER — Ambulatory Visit: Admitting: Family Medicine

## 2024-07-22 VITALS — BP 109/66 | HR 73 | Ht 65.0 in | Wt 176.0 lb

## 2024-07-22 DIAGNOSIS — J988 Other specified respiratory disorders: Secondary | ICD-10-CM | POA: Diagnosis not present

## 2024-07-22 MED ORDER — AMOXICILLIN-POT CLAVULANATE 875-125 MG PO TABS: 1.0000 | ORAL_TABLET | Freq: Two times a day (BID) | ORAL | 0 refills | Status: AC

## 2024-07-22 MED ORDER — HYDROCODONE BIT-HOMATROP MBR 5-1.5 MG/5ML PO SOLN: 5.0000 mL | Freq: Three times a day (TID) | ORAL | 0 refills | Status: DC | PRN

## 2024-07-22 NOTE — Patient Instructions (Signed)
 It was nice to see you today,  We addressed the following topics today: - I am sending in augmentin  for 5 days at twice a day.  - I am sending in a prescription for a cough syrup to use at night.  - for symptomatic relief do the following:  Tylenol  1000mg  every 8 hours Cepacol or other menthol lozenges for sore throat Afrin and saline nasal spray for nasal congestion   Have a great day,  Rolan Slain, MD

## 2024-07-22 NOTE — Progress Notes (Signed)
. ° °  Established Patient Office Visit  Subjective   Patient ID: Karen Gross, female    DOB: [birth year]  Age: 58 y.o. MRN: [id number]  Chief Complaint  Patient presents with   Cough     History of Present Illness   Karen Gross is a 58 year old female who presents with upper respiratory symptoms and a recent nosebleed.  She has been experiencing upper respiratory symptoms for the past eight days, including rhinorrhea, pharyngitis, and cough. No fever, nausea, vomiting, diarrhea, myalgia, or arthralgia. Over-the-counter medications such as Nyquil, DayQuil, and Tylenol  Cold and Flu have not improved her symptoms.  She experienced epistaxis today, which is her first occurrence. She has not used any nasal sprays.  She is a high engineer, site working with special needs students.  She takes Celebrex. The cough is particularly bothersome at night, affecting her sleep. She has previously used cough medicines containing codeine.  No known allergies, seasonal or otherwise, and no history of recurring sinus infections.          The 10-year ASCVD risk score (Arnett DK, et al., 2019) is: 1.4%  Health Maintenance Due  Topic Date Due   Hepatitis B Vaccines 19-59 Average Risk (1 of 3 - 19+ 3-dose series) Never done   Pneumococcal Vaccine: 50+ Years (1 of 1 - PCV) Never done   COVID-19 Vaccine (4 - 2025-26 season) 03/31/2024   Zoster Vaccines- Shingrix  (2 of 2) 06/06/2024      Objective:     BP 109/66   Pulse 73   Ht 5' 5 (1.651 m)   Wt 176 lb (79.8 kg)   SpO2 94%   BMI 29.29 kg/m    Physical Exam   Gen: alert, oriented Pulm:Decreased air movement on the right side.  Psych: pleasant affect        No results found for any visits on 07/22/24.      Assessment & Plan:   Respiratory infection Assessment & Plan: No improvement in sx after ~9 days.  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  - Prescribed 5-day  augmentin  for bacterial coverage. - Advised OTC medications including Tylenol  for symptom relief. - Recommended Afrin nasal spray and saline for congestion. - Prescribed Hycodan for nighttime cough, warned about drowsiness. - Advised Cepacol lozenges for sore throat. - discussed afrin and nasal vaseline for epistaxis.    Other orders -     HYDROcodone  Bit-Homatrop MBr; Take 5 mLs by mouth every 8 (eight) hours as needed for cough.  Dispense: 120 mL; Refill: 0 -     Amoxicillin -Pot Clavulanate; Take 1 tablet by mouth 2 (two) times daily for 5 days.  Dispense: 10 tablet; Refill: 0         Return if symptoms worsen or fail to improve.    Toribio MARLA Slain, MD

## 2024-07-22 NOTE — Assessment & Plan Note (Signed)
 No improvement in sx after ~9 days.  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  - Prescribed 5-day augmentin  for bacterial coverage. - Advised OTC medications including Tylenol  for symptom relief. - Recommended Afrin nasal spray and saline for congestion. - Prescribed Hycodan for nighttime cough, warned about drowsiness. - Advised Cepacol lozenges for sore throat. - discussed afrin and nasal vaseline for epistaxis.

## 2024-07-30 ENCOUNTER — Ambulatory Visit: Payer: Self-pay

## 2024-07-30 ENCOUNTER — Telehealth: Payer: Self-pay | Admitting: *Deleted

## 2024-07-30 NOTE — Telephone Encounter (Signed)
 FYI Only or Action Required?: Action required by provider: clinical question for provider, update on patient condition, and asking for recommendations for her runny nose.  Patient was last seen in primary care on 07/22/2024 by Chandra Toribio POUR, MD.  Called Nurse Triage reporting URI.  Symptoms began a week ago.  Interventions attempted: Rest, hydration, or home remedies.  Symptoms are: unchanged.  Triage Disposition: See PCP When Office is Open (Within 3 Days)  Patient/caregiver understands and will follow disposition?: No, wishes to speak with PCP  Copied from CRM #[id number]. Topic: Clinical - Red Word Triage >> Jul 30, 2024  3:56 PM Amy B wrote: Red Word that prompted transfer to Nurse Triage: Constantly blowing nose causing nose bleeds due to cold symptoms Reason for Disposition  [1] Nasal discharge AND [2] present > 10 days  Answer Assessment - Initial Assessment Questions Patient reports runny nose for over two weeks. Patient endorses bleeding from her nose when she is blowing her nose.   1. ONSET: When did the nasal discharge start?      Over two weeks 2. AMOUNT: How much discharge is there?      A good amount 3. COUGH: Do you have a cough? If Yes, ask: Describe the color of your mucus. (e.g., clear, white, yellow, green)     no 4. RESPIRATORY DISTRESS: Describe your breathing.      No breathing issues 5. FEVER: Do you have a fever? If Yes, ask: What is your temperature, how was it measured, and when did it start?     no 6. SEVERITY: Overall, how bad are you feeling right now? (e.g., doesn't interfere with normal activities, staying home from school/work, staying in bed)      Feeling good over all. 7. OTHER SYMPTOMS: Do you have any other symptoms? (e.g., earache, mouth sores, sore throat, wheezing)     Nosebleeds beings caused by blowing her nose  Protocols used: Common Cold-A-AH

## 2024-07-30 NOTE — Telephone Encounter (Signed)
Pt informed of the recommendations.

## 2024-07-30 NOTE — Telephone Encounter (Signed)
 Flonase  nasal spray daily for inflammation in the nasal cavity. Afrin spray during an actual nosebleed will stop the bleed faster.  Q-tip of vaseline applied to both nostrils twice daily for moisturizing nasal cavity   -Behind the counter at the pharmacy she can ask for Musinex D, which may help dry up the remaining congestion. Zytrc once daily will also help dry up the sinuses.

## 2024-07-30 NOTE — Telephone Encounter (Signed)
 Copied from CRM #[id number]. Topic: General - Other >> Jul 30, 2024  3:53 PM Amy B wrote: Reason for CRM: Patient called to provide currently med list:  aripiprazole , topiramate , divalproex sodium ER

## 2024-08-04 ENCOUNTER — Other Ambulatory Visit: Payer: Self-pay

## 2024-08-06 ENCOUNTER — Other Ambulatory Visit (INDEPENDENT_AMBULATORY_CARE_PROVIDER_SITE_OTHER): Payer: Self-pay | Admitting: Adult Health

## 2024-08-21 ENCOUNTER — Encounter (INDEPENDENT_AMBULATORY_CARE_PROVIDER_SITE_OTHER): Payer: Self-pay | Admitting: Adult Health

## 2024-08-21 ENCOUNTER — Ambulatory Visit (INDEPENDENT_AMBULATORY_CARE_PROVIDER_SITE_OTHER): Admitting: Adult Health

## 2024-08-21 VITALS — BP 101/64 | HR 72 | Temp 98.7°F | Ht 65.0 in | Wt 169.0 lb

## 2024-08-21 DIAGNOSIS — E7800 Pure hypercholesterolemia, unspecified: Secondary | ICD-10-CM | POA: Diagnosis not present

## 2024-08-21 DIAGNOSIS — E559 Vitamin D deficiency, unspecified: Secondary | ICD-10-CM | POA: Diagnosis not present

## 2024-08-21 DIAGNOSIS — Z6826 Body mass index (BMI) 26.0-26.9, adult: Secondary | ICD-10-CM

## 2024-08-21 DIAGNOSIS — Z0000 Encounter for general adult medical examination without abnormal findings: Secondary | ICD-10-CM

## 2024-08-21 DIAGNOSIS — G40909 Epilepsy, unspecified, not intractable, without status epilepticus: Secondary | ICD-10-CM | POA: Diagnosis not present

## 2024-08-21 DIAGNOSIS — E669 Obesity, unspecified: Secondary | ICD-10-CM

## 2024-08-21 DIAGNOSIS — Z6828 Body mass index (BMI) 28.0-28.9, adult: Secondary | ICD-10-CM

## 2024-08-21 NOTE — Progress Notes (Signed)
 "    WEIGHT SUMMARY AND BIOMETRICS  Vitals Temp: 98.7 F (37.1 C) BP: 101/64 Pulse Rate: 72 SpO2: 98 %   Anthropometric Measurements Height: 5' 5 (1.651 m) Weight: 169 lb (76.7 kg) BMI (Calculated): 28.12 Weight at Last Visit: 164lb Weight Lost Since Last Visit: 0lb Weight Gained Since Last Visit: 5lb Starting Weight: 217lb Total Weight Loss (lbs): 48 lb (21.8 kg) Waist Measurement : 47 inches   Body Composition  Body Fat %: 36.9 % Fat Mass (lbs): 62.6 lbs Muscle Mass (lbs): 101.6 lbs Total Body Water (lbs): 68 lbs Visceral Fat Rating : 9   Other Clinical Data RMR: 1800 Fasting: Yes Labs: No Today's Visit #: 25 Starting Date: 10/01/19 Comments: Cat 2    Chief Complaint:   OBESITY Zniya is here to discuss her progress with her obesity treatment plan.  She is on the the Category 2 Plan and states she is following her eating plan approximately 50 % of the time.  She states she is exercising Walking 40 minutes 0-3 times per week.  Interim History:  She started [facility] 10/01/2019, weight 217 lbs with correspondong BMI 35 Started Mx Phase on 03/11/2020, weight 185 lbs with corresponding BMI 29 Followed by [facility]  Current weight 169 lbs with corresponding BMI 28.2 She has been recovering from URI - activity level quite low during the last month. She was able to briskly walk >40 mins yesterday, denied CP or dyspnea  Her son will get married 06/06/2025- [place] (son: Marinell, future daughter in law: Waddell). Her daughter is doing well in college (daughter: [name]) Her spouse will retire this year- 34 years in education!  Subjective:   1. Healthcare maintenance 07/22/2024 PCP OV for URI Notes: Respiratory infection Assessment & Plan: No improvement in sx after ~9 days.  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  - Prescribed 5-day augmentin  for bacterial coverage. - Advised OTC medications including  Tylenol  for symptom relief. - Recommended Afrin nasal spray and saline for congestion. - Prescribed Hycodan for nighttime cough, warned about drowsiness. - Advised Cepacol lozenges for sore throat. - discussed afrin and nasal vaseline for epistaxis.  2. Vitamin D  deficiency  Latest Reference Range & Units 02/07/24 14:43 04/03/24 08:03 04/07/24 08:52  Vitamin D , 25-Hydroxy 30.0 - 100.0 ng/mL 63.4 68.3 67.8   She has been off weekly ergocalciferol  for > 1 month due to inability to obtain refill She is agreeable to labs today  3. Seizure disorder (HCC) Condition stable Depakote ER 500mg  once daily  4. Pure hypercholesterolemia She denies tobacco/vape use  Lipid Panel     Component Value Date/Time   CHOL 191 04/07/2024 0852   TRIG 114 04/07/2024 0852   HDL 63 04/07/2024 0852   CHOLHDL 3.0 04/07/2024 0852   LDLCALC 108 (H) 04/07/2024 0852   LABVLDL 20 04/07/2024 0852   The 10-year ASCVD risk score (Arnett DK, et al., 2019) is: 1.2%   Values used to calculate the score:     Age: 59 years     Clinically relevant sex: Female     Is Non-Hispanic African American: No     Diabetic: No     Tobacco smoker: No     Systolic Blood Pressure: 101 mmHg     Is BP treated: No     HDL Cholesterol: 63 mg/dL     Total Cholesterol: 191 mg/dL   ASCVD Risk Stratification is acceptable  Assessment/Plan:   1. Healthcare  maintenance (Primary) Continue to increase hydration and advance activity as tolerated  2. Vitamin D  deficiency Check Labs -Vit D Level MyChart pt after labs and refill Ergocalciferol  as appropriate  3. Seizure disorder (HCC) Continue current medication regime  4. Pure hypercholesterolemia Limit sat fat and advance activity as tolerated  5. BMI 26.0-26.9,adult, CURRENT BMI 28.2  Teresia is currently in the action stage of change. As such, her goal is to maintain weight for now. She has agreed to the Category 2 Plan.   Exercise goals: For substantial health benefits,  adults should do at least 150 minutes (2 hours and 30 minutes) a week of moderate-intensity, or 75 minutes (1 hour and 15 minutes) a week of vigorous-intensity aerobic physical activity, or an equivalent combination of moderate- and vigorous-intensity aerobic activity. Aerobic activity should be performed in episodes of at least 10 minutes, and preferably, it should be spread throughout the week.  Behavioral modification strategies: increasing lean protein intake, decreasing simple carbohydrates, increasing vegetables, increasing water intake, no skipping meals, meal planning and cooking strategies, keeping healthy foods in the home, and planning for success.  Lubna has agreed to follow-up with our clinic in 12 weeks. She was informed of the importance of frequent follow-up visits to maximize her success with intensive lifestyle modifications for her multiple health conditions.   Japleen was informed we would discuss her lab results at her next visit unless there is a critical issue that needs to be addressed sooner. Mariely agreed to keep her next visit at the agreed upon time to discuss these results.  Objective:   Blood pressure 101/64, pulse 72, temperature 98.7 F (37.1 C), height 5' 5 (1.651 m), weight 169 lb (76.7 kg), SpO2 98%. Body mass index is 28.12 kg/m.  General: Cooperative, alert, well developed, in no acute distress. HEENT: Conjunctivae and lids unremarkable. Cardiovascular: Regular rhythm.  Lungs: Normal work of breathing. Neurologic: No focal deficits.   Lab Results  Component Value Date   CREATININE 0.79 04/07/2024   BUN 17 04/07/2024   NA 143 04/07/2024   K 4.2 04/07/2024   CL 109 (H) 04/07/2024   CO2 21 04/07/2024   Lab Results  Component Value Date   ALT 14 04/07/2024   AST 18 04/07/2024   ALKPHOS 82 04/07/2024   BILITOT 0.4 04/07/2024   Lab Results  Component Value Date   HGBA1C 4.9 04/07/2024   HGBA1C 5.0 04/03/2024   HGBA1C 5.0 11/07/2023   HGBA1C  5.2 07/23/2023   HGBA1C 5.0 01/24/2023   Lab Results  Component Value Date   INSULIN  6.2 04/03/2024   INSULIN  5.1 11/07/2023   INSULIN  5.8 12/04/2022   INSULIN  6.5 10/01/2019   Lab Results  Component Value Date   TSH 2.810 04/07/2024   Lab Results  Component Value Date   CHOL 191 04/07/2024   HDL 63 04/07/2024   LDLCALC 108 (H) 04/07/2024   TRIG 114 04/07/2024   CHOLHDL 3.0 04/07/2024   Lab Results  Component Value Date   VD25OH 67.8 04/07/2024   VD25OH 68.3 04/03/2024   VD25OH 63.4 02/07/2024   Lab Results  Component Value Date   WBC 4.6 04/07/2024   HGB 14.9 04/07/2024   HCT 46.4 04/07/2024   MCV 97 04/07/2024   PLT 222 04/07/2024   Lab Results  Component Value Date   FERRITIN 24 10/04/2020   Attestation Statements:   Reviewed by clinician on day of visit: allergies, medications, problem list, medical history, surgical history, family history, social history, and  previous encounter notes.  I have reviewed the above documentation for accuracy and completeness, and I agree with the above. -  Trong Gosling d. Saqib Cazarez, NP-C "

## 2024-08-22 ENCOUNTER — Other Ambulatory Visit (INDEPENDENT_AMBULATORY_CARE_PROVIDER_SITE_OTHER): Payer: Self-pay | Admitting: Adult Health

## 2024-08-22 ENCOUNTER — Encounter (INDEPENDENT_AMBULATORY_CARE_PROVIDER_SITE_OTHER): Payer: Self-pay | Admitting: Adult Health

## 2024-08-22 DIAGNOSIS — E559 Vitamin D deficiency, unspecified: Secondary | ICD-10-CM

## 2024-08-22 LAB — VITAMIN D 25 HYDROXY (VIT D DEFICIENCY, FRACTURES): Vit D, 25-Hydroxy: 42.7 ng/mL (ref 30.0–100.0)

## 2024-08-22 MED ORDER — VITAMIN D (ERGOCALCIFEROL) 1.25 MG (50000 UNIT) PO CAPS: 50000.0000 [IU] | ORAL_CAPSULE | ORAL | 0 refills | Status: AC

## 2024-09-05 ENCOUNTER — Ambulatory Visit: Payer: Self-pay

## 2024-09-05 NOTE — Telephone Encounter (Signed)
 FYI Only or Action Required?: Action required by provider: request for appointment and clinical question for provider.  Patient was last seen in primary care on 08/21/2024 by Jonel Rockie BIRCH, NP.  Called Nurse Triage reporting Chest Pain.  Symptoms began a week ago.  Interventions attempted: OTC medications: Ibuprofen .  Symptoms are: unchanged.  Triage Disposition: See PCP When Office is Open (Within 3 Days)  Patient/caregiver understands and will follow disposition?: Yes   Reason for Disposition  [1] MILD pain AND [2] present > 7 days  Answer Assessment - Initial Assessment Questions Patient states that she believes she pulled a muscle in her chest last Friday. She has been experiencing pain from under her right armpit into chest that worsens with movement. She has been taking Ibuprofen  with some relief, but feels she may need something stronger to help with the discomfort. Office visit advised-no available appts at PCP office until March. Attempted to call [facility] to inquire about sooner appt with no answer. Please reach out to patient regarding appt. Patient requesting medication for pain (something a little stronger than Ibuprofen ) if possible.    1. MECHANISM: How did the injury happen?     Not sure, states that she was playing with dogs and also fell on the ice and is not sure at what point the injury occurred  2. ONSET: When did the injury happen? (e.g., minutes, hours, days ago)     Friday 1/30  3. LOCATION: Where on the chest is the injury located? Where does it hurt?     Right side from armpit into chest  4. CHEST OR RIB PAIN SEVERITY: Is there pain? If Yes, ask: How bad is the pain? (e.g., Scale 0-10; none, mild, moderate, severe)     Yes, currently 1-2/10 and worse with movement  5. BREATHING DIFFICULTY: Are you having any difficulty breathing? If Yes, ask: How bad is it?  (e.g., none, mild, moderate, severe)      Denies any SOB  6. OTHER SYMPTOMS (e.g.,  cough, fever, rash)      Denies any other symptoms at this time  7. PREGNANCY: Is there any chance you are pregnant? When was your last menstrual period?     NA  Protocols used: Chest Injury - Bending, Lifting, or Twisting-A-AH  Pt thinks she pulled a muscle in her chest says it happened last Friday pt has been taking ibuprofen  thinks she needs something stronger.

## 2024-10-09 ENCOUNTER — Ambulatory Visit (INDEPENDENT_AMBULATORY_CARE_PROVIDER_SITE_OTHER): Admitting: Family Medicine

## 2024-10-17 ENCOUNTER — Ambulatory Visit
